# Patient Record
Sex: Female | Born: 1980
Health system: Southern US, Community
[De-identification: ages and names within clinical notes are randomized; demographics above are authoritative.]

## PROBLEM LIST (undated history)

## (undated) DIAGNOSIS — M2550 Pain in unspecified joint: Secondary | ICD-10-CM

## (undated) DIAGNOSIS — E669 Obesity, unspecified: Secondary | ICD-10-CM

## (undated) DIAGNOSIS — R519 Headache, unspecified: Secondary | ICD-10-CM

## (undated) DIAGNOSIS — Z9884 Bariatric surgery status: Secondary | ICD-10-CM

## (undated) DIAGNOSIS — E559 Vitamin D deficiency, unspecified: Secondary | ICD-10-CM

## (undated) DIAGNOSIS — M545 Low back pain, unspecified: Secondary | ICD-10-CM

## (undated) DIAGNOSIS — Z9889 Other specified postprocedural states: Secondary | ICD-10-CM

## (undated) DIAGNOSIS — R7303 Prediabetes: Secondary | ICD-10-CM

## (undated) DIAGNOSIS — R51 Headache: Secondary | ICD-10-CM

## (undated) DIAGNOSIS — M25569 Pain in unspecified knee: Secondary | ICD-10-CM

## (undated) DIAGNOSIS — R011 Cardiac murmur, unspecified: Secondary | ICD-10-CM

## (undated) DIAGNOSIS — R112 Nausea with vomiting, unspecified: Secondary | ICD-10-CM

## (undated) DIAGNOSIS — K5900 Constipation, unspecified: Secondary | ICD-10-CM

## (undated) DIAGNOSIS — Z87442 Personal history of urinary calculi: Secondary | ICD-10-CM

## (undated) DIAGNOSIS — M84376A Stress fracture, unspecified foot, initial encounter for fracture: Secondary | ICD-10-CM

## (undated) DIAGNOSIS — R600 Localized edema: Secondary | ICD-10-CM

## (undated) DIAGNOSIS — F419 Anxiety disorder, unspecified: Secondary | ICD-10-CM

## (undated) DIAGNOSIS — N83209 Unspecified ovarian cyst, unspecified side: Secondary | ICD-10-CM

## (undated) DIAGNOSIS — T8859XA Other complications of anesthesia, initial encounter: Secondary | ICD-10-CM

## (undated) DIAGNOSIS — M549 Dorsalgia, unspecified: Secondary | ICD-10-CM

## (undated) HISTORY — DX: Prediabetes: R73.03

## (undated) HISTORY — PX: NASAL SINUS SURGERY: SHX719

## (undated) HISTORY — DX: Localized edema: R60.0

## (undated) HISTORY — DX: Low back pain, unspecified: M54.50

## (undated) HISTORY — DX: Anxiety disorder, unspecified: F41.9

## (undated) HISTORY — PX: WISDOM TOOTH EXTRACTION: SHX21

## (undated) HISTORY — DX: Headache: R51

## (undated) HISTORY — DX: Pain in unspecified knee: M25.569

## (undated) HISTORY — DX: Pain in unspecified joint: M25.50

## (undated) HISTORY — PX: LAPAROSCOPIC GASTRIC BANDING: SHX1100

## (undated) HISTORY — DX: Vitamin D deficiency, unspecified: E55.9

## (undated) HISTORY — DX: Constipation, unspecified: K59.00

## (undated) HISTORY — DX: Dorsalgia, unspecified: M54.9

## (undated) HISTORY — PX: TUBAL LIGATION: SHX77

## (undated) HISTORY — DX: Stress fracture, unspecified foot, initial encounter for fracture: M84.376A

## (undated) HISTORY — DX: Low back pain: M54.5

## (undated) HISTORY — DX: Headache, unspecified: R51.9

## (undated) HISTORY — DX: Obesity, unspecified: E66.9

---

## 2016-11-17 DIAGNOSIS — Z23 Encounter for immunization: Secondary | ICD-10-CM | POA: Diagnosis not present

## 2017-03-25 ENCOUNTER — Inpatient Hospital Stay (HOSPITAL_COMMUNITY): Payer: 59 | Admitting: Certified Registered Nurse Anesthetist

## 2017-03-25 ENCOUNTER — Inpatient Hospital Stay (HOSPITAL_COMMUNITY): Payer: 59

## 2017-03-25 ENCOUNTER — Encounter (HOSPITAL_COMMUNITY): Payer: Self-pay

## 2017-03-25 ENCOUNTER — Observation Stay (HOSPITAL_COMMUNITY)
Admission: AD | Admit: 2017-03-25 | Discharge: 2017-03-26 | Disposition: A | Discharge status: Home / Self Care | Payer: 59 | Source: Ambulatory Visit | Attending: Surgery | Admitting: Surgery

## 2017-03-25 ENCOUNTER — Encounter (HOSPITAL_COMMUNITY): Admission: AD | Disposition: A | Discharge status: Home / Self Care | Payer: Self-pay | Source: Ambulatory Visit

## 2017-03-25 DIAGNOSIS — N200 Calculus of kidney: Secondary | ICD-10-CM | POA: Insufficient documentation

## 2017-03-25 DIAGNOSIS — N83209 Unspecified ovarian cyst, unspecified side: Secondary | ICD-10-CM | POA: Diagnosis not present

## 2017-03-25 DIAGNOSIS — Z9884 Bariatric surgery status: Secondary | ICD-10-CM | POA: Insufficient documentation

## 2017-03-25 DIAGNOSIS — R109 Unspecified abdominal pain: Secondary | ICD-10-CM | POA: Diagnosis not present

## 2017-03-25 DIAGNOSIS — K3580 Unspecified acute appendicitis: Secondary | ICD-10-CM | POA: Diagnosis not present

## 2017-03-25 DIAGNOSIS — Z6841 Body Mass Index (BMI) 40.0 and over, adult: Secondary | ICD-10-CM | POA: Diagnosis not present

## 2017-03-25 DIAGNOSIS — K37 Unspecified appendicitis: Secondary | ICD-10-CM | POA: Diagnosis present

## 2017-03-25 HISTORY — PX: LAPAROSCOPIC APPENDECTOMY: SHX408

## 2017-03-25 HISTORY — DX: Bariatric surgery status: Z98.84

## 2017-03-25 HISTORY — DX: Unspecified ovarian cyst, unspecified side: N83.209

## 2017-03-25 LAB — URINALYSIS, ROUTINE W REFLEX MICROSCOPIC
BACTERIA UA: NONE SEEN
BILIRUBIN URINE: NEGATIVE
Glucose, UA: NEGATIVE mg/dL
KETONES UR: NEGATIVE mg/dL
LEUKOCYTES UA: NEGATIVE
Nitrite: NEGATIVE
Protein, ur: NEGATIVE mg/dL
Specific Gravity, Urine: 1.02 (ref 1.005–1.030)
pH: 5 (ref 5.0–8.0)

## 2017-03-25 LAB — CBC
HCT: 38.5 % (ref 36.0–46.0)
Hemoglobin: 12.7 g/dL (ref 12.0–15.0)
MCH: 29.1 pg (ref 26.0–34.0)
MCHC: 33 g/dL (ref 30.0–36.0)
MCV: 88.1 fL (ref 78.0–100.0)
PLATELETS: 225 10*3/uL (ref 150–400)
RBC: 4.37 MIL/uL (ref 3.87–5.11)
RDW: 13.4 % (ref 11.5–15.5)
WBC: 7.4 10*3/uL (ref 4.0–10.5)

## 2017-03-25 LAB — COMPREHENSIVE METABOLIC PANEL
ALBUMIN: 4.2 g/dL (ref 3.5–5.0)
ALT: 16 U/L (ref 14–54)
AST: 14 U/L — AB (ref 15–41)
Alkaline Phosphatase: 60 U/L (ref 38–126)
Anion gap: 9 (ref 5–15)
BUN: 11 mg/dL (ref 6–20)
CHLORIDE: 104 mmol/L (ref 101–111)
CO2: 23 mmol/L (ref 22–32)
Calcium: 9.3 mg/dL (ref 8.9–10.3)
Creatinine, Ser: 0.72 mg/dL (ref 0.44–1.00)
GFR calc Af Amer: >60 mL/min (ref >60)
Glucose, Bld: 107 mg/dL — ABNORMAL HIGH (ref 65–99)
POTASSIUM: 3.7 mmol/L (ref 3.5–5.1)
Sodium: 136 mmol/L (ref 135–145)
Total Bilirubin: 0.8 mg/dL (ref 0.3–1.2)
Total Protein: 7.7 g/dL (ref 6.5–8.1)

## 2017-03-25 LAB — POCT PREGNANCY, URINE: PREG TEST UR: NEGATIVE

## 2017-03-25 SURGERY — APPENDECTOMY, LAPAROSCOPIC
Anesthesia: General

## 2017-03-25 MED ORDER — ACETAMINOPHEN 325 MG PO TABS: 325.0000 mg | ORAL_TABLET | ORAL | Status: DC | PRN

## 2017-03-25 MED ORDER — IOPAMIDOL (ISOVUE-300) INJECTION 61%
100.0000 mL | Freq: Once | INTRAVENOUS | Status: AC | PRN
  Administered 2017-03-25: 100 mL via INTRAVENOUS

## 2017-03-25 MED ORDER — ROCURONIUM BROMIDE 10 MG/ML (PF) SYRINGE
PREFILLED_SYRINGE | INTRAVENOUS | Status: DC | PRN
  Administered 2017-03-25: 40 mg via INTRAVENOUS

## 2017-03-25 MED ORDER — LACTATED RINGERS IV SOLN
INTRAVENOUS | Status: DC | PRN
  Administered 2017-03-25: 23:00:00 via INTRAVENOUS

## 2017-03-25 MED ORDER — BUPIVACAINE-EPINEPHRINE 0.25% -1:200000 IJ SOLN
INTRAMUSCULAR | Status: AC
  Filled 2017-03-25: qty 1

## 2017-03-25 MED ORDER — ACETAMINOPHEN 160 MG/5ML PO SOLN: 325.0000 mg | ORAL | Status: DC | PRN

## 2017-03-25 MED ORDER — DEXAMETHASONE SODIUM PHOSPHATE 4 MG/ML IJ SOLN
INTRAMUSCULAR | Status: DC | PRN
  Administered 2017-03-25: 10 mg via INTRAVENOUS

## 2017-03-25 MED ORDER — SUCCINYLCHOLINE CHLORIDE 200 MG/10ML IV SOSY
PREFILLED_SYRINGE | INTRAVENOUS | Status: DC | PRN
  Administered 2017-03-25: 120 mg via INTRAVENOUS

## 2017-03-25 MED ORDER — ONDANSETRON HCL 4 MG/2ML IJ SOLN
4.0000 mg | Freq: Once | INTRAMUSCULAR | Status: AC | PRN
  Administered 2017-03-25: 4 mg via INTRAVENOUS

## 2017-03-25 MED ORDER — SUGAMMADEX SODIUM 200 MG/2ML IV SOLN
INTRAVENOUS | Status: DC | PRN
  Administered 2017-03-25: 300 mg via INTRAVENOUS

## 2017-03-25 MED ORDER — KETOROLAC TROMETHAMINE 30 MG/ML IJ SOLN
30.0000 mg | Freq: Once | INTRAMUSCULAR | Status: DC | PRN
  Administered 2017-03-25: 30 mg via INTRAVENOUS

## 2017-03-25 MED ORDER — PIPERACILLIN-TAZOBACTAM 3.375 G IVPB 30 MIN
3.3750 g | Freq: Once | INTRAVENOUS | Status: AC
  Administered 2017-03-25: 3.375 g via INTRAVENOUS

## 2017-03-25 MED ORDER — MEPERIDINE HCL 50 MG/ML IJ SOLN: 6.2500 mg | INTRAMUSCULAR | Status: DC | PRN

## 2017-03-25 MED ORDER — FENTANYL CITRATE (PF) 100 MCG/2ML IJ SOLN
INTRAMUSCULAR | Status: DC | PRN
  Administered 2017-03-25 (×2): 100 ug via INTRAVENOUS
  Administered 2017-03-26 (×2): 50 ug via INTRAVENOUS

## 2017-03-25 MED ORDER — OXYCODONE HCL 5 MG/5ML PO SOLN
5.0000 mg | Freq: Once | ORAL | Status: DC | PRN
Start: 2017-03-25 — End: 2017-03-26
  Filled 2017-03-25: qty 5

## 2017-03-25 MED ORDER — FENTANYL CITRATE (PF) 100 MCG/2ML IJ SOLN: 25.0000 ug | INTRAMUSCULAR | Status: DC | PRN

## 2017-03-25 MED ORDER — OXYCODONE HCL 5 MG PO TABS: 5.0000 mg | ORAL_TABLET | Freq: Once | ORAL | Status: DC | PRN

## 2017-03-25 MED ORDER — PROPOFOL 10 MG/ML IV BOLUS
INTRAVENOUS | Status: DC | PRN
  Administered 2017-03-25: 200 mg via INTRAVENOUS

## 2017-03-25 MED ORDER — BUPIVACAINE-EPINEPHRINE 0.25% -1:200000 IJ SOLN
INTRAMUSCULAR | Status: DC | PRN
  Administered 2017-03-25: 50 mL

## 2017-03-25 MED ORDER — LIDOCAINE 2% (20 MG/ML) 5 ML SYRINGE
INTRAMUSCULAR | Status: DC | PRN
  Administered 2017-03-25: 80 mg via INTRAVENOUS

## 2017-03-25 MED ORDER — LACTATED RINGERS IR SOLN
Status: DC | PRN
  Administered 2017-03-25: 1000 mL

## 2017-03-25 SURGICAL SUPPLY — 32 items
APPLIER CLIP ROT 10 11.4 M/L (STAPLE)
BENZOIN TINCTURE PRP APPL 2/3 (GAUZE/BANDAGES/DRESSINGS) IMPLANT
CABLE HIGH FREQUENCY MONO STRZ (ELECTRODE) ×2 IMPLANT
CHLORAPREP W/TINT 26ML (MISCELLANEOUS) ×2 IMPLANT
CLIP APPLIE ROT 10 11.4 M/L (STAPLE) IMPLANT
COVER SURGICAL LIGHT HANDLE (MISCELLANEOUS) ×2 IMPLANT
CUTTER FLEX LINEAR 45M (STAPLE) ×2 IMPLANT
DECANTER SPIKE VIAL GLASS SM (MISCELLANEOUS) ×2 IMPLANT
DERMABOND ADVANCED (GAUZE/BANDAGES/DRESSINGS) ×1
DERMABOND ADVANCED .7 DNX12 (GAUZE/BANDAGES/DRESSINGS) ×1 IMPLANT
DRAPE LAPAROSCOPIC ABDOMINAL (DRAPES) ×2 IMPLANT
ELECT REM PT RETURN 15FT ADLT (MISCELLANEOUS) ×2 IMPLANT
ENDOLOOP SUT PDS II  0 18 (SUTURE)
ENDOLOOP SUT PDS II 0 18 (SUTURE) IMPLANT
GLOVE SURG SIGNA 7.5 PF LTX (GLOVE) ×2 IMPLANT
GOWN STRL REUS W/TWL XL LVL3 (GOWN DISPOSABLE) ×4 IMPLANT
KIT BASIN OR (CUSTOM PROCEDURE TRAY) ×2 IMPLANT
POUCH SPECIMEN RETRIEVAL 10MM (ENDOMECHANICALS) ×2 IMPLANT
RELOAD 45 VASCULAR/THIN (ENDOMECHANICALS) IMPLANT
RELOAD STAPLE TA45 3.5 REG BLU (ENDOMECHANICALS) ×2 IMPLANT
SCISSORS LAP 5X35 DISP (ENDOMECHANICALS) ×2 IMPLANT
SET IRRIG TUBING LAPAROSCOPIC (IRRIGATION / IRRIGATOR) ×2 IMPLANT
SHEARS HARMONIC ACE PLUS 36CM (ENDOMECHANICALS) ×2 IMPLANT
SLEEVE XCEL OPT CAN 5 100 (ENDOMECHANICALS) ×2 IMPLANT
STRIP CLOSURE SKIN 1/2X4 (GAUZE/BANDAGES/DRESSINGS) IMPLANT
SUT MNCRL AB 4-0 PS2 18 (SUTURE) ×2 IMPLANT
SUT VIC AB 2-0 SH 18 (SUTURE) IMPLANT
TOWEL OR 17X26 10 PK STRL BLUE (TOWEL DISPOSABLE) ×2 IMPLANT
TOWEL OR NON WOVEN STRL DISP B (DISPOSABLE) ×2 IMPLANT
TRAY LAPAROSCOPIC (CUSTOM PROCEDURE TRAY) ×2 IMPLANT
TROCAR BLADELESS OPT 5 100 (ENDOMECHANICALS) ×2 IMPLANT
TROCAR XCEL BLUNT TIP 100MML (ENDOMECHANICALS) ×2 IMPLANT

## 2017-03-25 NOTE — H&P (Signed)
Re:   Carrie HurstJessica Buck DOB:   18-Aug-1980 MRN:   [id number]  Chief Complaint Abdominal pain  ASSESEMENT AND PLAN: 1.  Appendicitis  I discussed with the patient the indications and risks of appendiceal surgery.  The primary risks of appendiceal surgery include, but are not limited to, bleeding, infection, bowel surgery, and open surgery.  There is also the risk that the patient may have continued symptoms after surgery.  We discussed the typical post-operative recovery course. I tried to answer the patient's questions.  2.  Lap band 3.  Morbid Obesity - BMI - 43 4.  Taking HCG shots for weigh loss 5.  Kidney stones on CT  Chief Complaint  Patient presents with  . Abdominal Pain   PHYSICIAN REQUESTING CONSULTATION: Patient, No Pcp Per  HISTORY OF PRESENT ILLNESS: Carrie HurstJessica Buck is a 37 y.o. (DOB: 18-Aug-1980)  white female whose primary care physician is Patient, No Pcp Per and [facility].  She does not have a PCP, because she moved to the [place] area around Nov 2018. She is by herself.   The patient was awoken at 1:30 AM this morning.  She had intense diffuse abdominal pain with nausea and vomiting.  She had a stuffed animal that she was able to warm up (warmy), she fell back asleep until about 8:30 AM, but continued to feel worse.  As the day progressed on she felt no better and [facility] around 5 PM.  She has a history of a lap band placed in [place] in 2010.  She had trouble getting the lap band serviced, so basically is been nonfunctioning for several years.  She was seeing a weight loss clinic in [place] and is getting hCG shots from them for weight loss.  She has no history of stomach, liver, colon disease.  Besides her lap band she has had 2 C-sections and a tubal ligation.  CT scan of abdomen - 03/25/2017 - 1) Acute appendicitis, 2) Enlarged spleen, 3) non obstructing kidney stones, 4) lap band in good  position    Past Medical History:  Diagnosis Date  . LAP-BAND surgery status   . Ovarian cyst       Past Surgical History:  Procedure Laterality Date  . CESAREAN SECTION    . LAPAROSCOPIC GASTRIC BANDING    . NASAL SINUS SURGERY    . TUBAL LIGATION        No current facility-administered medications for this encounter.      No Known Allergies  REVIEW OF SYSTEMS: Skin:  No history of rash.  No history of abnormal moles. Infection:  No history of hepatitis or HIV.  No history of MRSA. Neurologic:  No history of stroke.  No history of seizure.  No history of headaches. Cardiac:  No history of hypertension. No history of heart disease.  No history of seeing a cardiologist. Pulmonary:  Does not smoke cigarettes.  No asthma or bronchitis.  No OSA/CPAP.  Endocrine:  No diabetes. No thyroid disease. Gastrointestinal:  See HPI Urologic:  Kidney stones, bilateral Musculoskeletal:  No history of joint or back disease. Hematologic:  No bleeding disorder.  No history of anemia.  Not anticoagulated. Psycho-social:  The patient is oriented.   The patient has no obvious psychologic or social impairment to understanding our conversation and plan.  SOCIAL and FAMILY HISTORY: Married. Husband, Apolinar JunesBrandon [phone] is a stay at home father.  He also has a bad phone  connection at home. They have 2 daughters, 3 and 5. She works with Optometrist at [employer]  She lived in [place], [place], until Nov 2017.  She moved to [place] because a friend offered a free house.  She moved to [facility] in Nov 2018 because of a job.  Her husband is a stay at home father.  No family history of appendicitis  PHYSICAL EXAM: BP 138/84 (BP Location: Left Arm)   Pulse 83   Temp 98.4 F (36.9 C) (Oral)   Resp 16   Ht 5\' 5"  (1.651 m)   Wt 118.7 kg (261 lb 12 oz)   LMP 03/11/2017   BMI 43.56 kg/m   General: Obese WF who is alert and generally healthy appearing.  Skin:  Inspection and  palpation - no mass or rash. Eyes:  Conjunctiva and lids unremarkable.            Pupils are equal Ears, Nose, Mouth, and Throat:  Ears and nose unremarkable            Lips and teeth are unremarable. Neck: Supple. No mass, trachea midline.  No thyroid mass. Lymph Nodes:  No supraclavicular, cervical, or inguinal nodes. Lungs: Normal respiratory effort.  Clear to auscultation and symmetric breath sounds. Heart:  Palpation of the heart is normal.            Auscultation: RRR. No murmur or rub.  Abdomen: Soft. No mass.  No hernia. Lap band port in LUQ  Decreased bowel sounds.  Tender in the right abdomen with some guarding. Rectal: Not done. Musculoskeletal:  Normal gait.            Good muscle strength and ROM  in upper and lower extremities. Neurologic:  Grossly intact to motor and sensory function. Psychiatric: Normal judgement and insight. Behavior is normal.            Oriented to time, person, place.   DATA REVIEWED, COUNSELING AND COORDINATION OF CARE: Epic notes reviewed. Counseling and coordination of care exceeded more than 50% of the time spent with patient. Total time spent with patient and charting: 45 minutes  Ovidio Kin, MD,  [facility], [place] [address] [place]  [address]   [place], [place]    [address] Phone:  [phone] FAX:  [phone]

## 2017-03-25 NOTE — MAU Note (Signed)
Reports right sided lower abdominal pain, no vaginal bleeding.  hx of ovarian cysts, 2 have ruptured.

## 2017-03-25 NOTE — MAU Provider Note (Signed)
History     CSN: [id number]  Arrival date and time: 03/25/17 1709   First Provider Initiated Contact with Patient 03/25/17 1802      Chief Complaint  Patient presents with  . Abdominal Pain   HPI   Carrie Buck is a 37 y.o. female G2P2002 here in MAU with abdominal pain. States her pain started at 1:30 this am. Says she had nausea associated with the pain. States she vomited 3 X. Says she had a lot of firey hot pain in her lower abdomen; worse in the RLQ. Says she took tylenol which dulled the pain. Says the pain is constantly there and just feels annoying.  History of ovarian cysts;  With ruptured. No bleeding, no urinary complaints. Had her tubes tide after her last pregnancy.  Patient is married and recently moved to [place] from [place].   OB History    Gravida Para Term Preterm AB Living   _0 SAB TAB Ectopic Multiple Live Births                  Past Medical History:  Diagnosis Date  . LAP-BAND surgery status   . Ovarian cyst     Past Surgical History:  Procedure Laterality Date  . CESAREAN SECTION    . LAPAROSCOPIC GASTRIC BANDING    . NASAL SINUS SURGERY    . TUBAL LIGATION      History reviewed. No pertinent family history.  Social History   Tobacco Use  . Smoking status: Never Smoker  . Smokeless tobacco: Never Used  Substance Use Topics  . Alcohol use: Yes    Frequency: Never    Comment: glass of wine with dinner  . Drug use: No    Allergies: No Known Allergies  No medications prior to admission.   Results for orders placed or performed during the hospital encounter of 03/25/17 (from the past 72 hour(s))  Urinalysis, Routine w reflex microscopic     Status: Abnormal   Collection Time: 03/25/17  5:20 PM  Result Value Ref Range   Color, Urine YELLOW YELLOW   APPearance HAZY (A) CLEAR   Specific Gravity, Urine 1.020 1.005 - 1.030   pH 5.0 5.0 - 8.0   Glucose, UA NEGATIVE NEGATIVE mg/dL   Hgb urine dipstick MODERATE (A)  NEGATIVE   Bilirubin Urine NEGATIVE NEGATIVE   Ketones, ur NEGATIVE NEGATIVE mg/dL   Protein, ur NEGATIVE NEGATIVE mg/dL   Nitrite NEGATIVE NEGATIVE   Leukocytes, UA NEGATIVE NEGATIVE   RBC / HPF 6-30 0 - 5 RBC/hpf   WBC, UA 0-5 0 - 5 WBC/hpf   Bacteria, UA NONE SEEN NONE SEEN   Squamous Epithelial / LPF 6-30 (A) NONE SEEN   Mucus PRESENT     Comment: Performed at [facility], [address] [place], [place] [address]  CBC     Status: None   Collection Time: 03/25/17  5:49 PM  Result Value Ref Range   WBC 7.4 4.0 - 10.5 K/uL   RBC 4.37 3.87 - 5.11 MIL/uL   Hemoglobin 12.7 12.0 - 15.0 g/dL   HCT 38.5 36.0 - 46.0 %   MCV 88.1 78.0 - 100.0 fL   MCH 29.1 26.0 - 34.0 pg   MCHC 33.0 30.0 - 36.0 g/dL   RDW 13.4 11.5 - 15.5 %   Platelets 225 150 - 400 K/uL    Comment: Performed at [facility], [address], [place], [place] [address]  Comprehensive metabolic panel     Status: Abnormal   Collection Time: 03/25/17  5:49 PM  Result Value Ref Range   Sodium 136 135 - 145 mmol/L   Potassium 3.7 3.5 - 5.1 mmol/L   Chloride 104 101 - 111 mmol/L   CO2 23 22 - 32 mmol/L   Glucose, Bld 107 (H) 65 - 99 mg/dL   BUN 11 6 - 20 mg/dL   Creatinine, Ser 0.72 0.44 - 1.00 mg/dL   Calcium 9.3 8.9 - 10.3 mg/dL   Total Protein 7.7 6.5 - 8.1 g/dL   Albumin 4.2 3.5 - 5.0 g/dL   AST 14 (L) 15 - 41 U/L   ALT 16 14 - 54 U/L   Alkaline Phosphatase 60 38 - 126 U/L   Total Bilirubin 0.8 0.3 - 1.2 mg/dL   GFR calc non Af Amer >60 >60 mL/min   GFR calc Af Amer >60 >60 mL/min    Comment: (NOTE) The eGFR has been calculated using the CKD EPI equation. This calculation has not been validated in all clinical situations. eGFR's persistently <60 mL/min signify possible Chronic Kidney Disease.    Anion gap 9 5 - 15    Comment: Performed at [facility], [address] [place] [address], [place], [place] [address]  Pregnancy, urine POC     Status: None   Collection Time: 03/25/17  6:29 PM  Result Value Ref  Range   Preg Test, Ur NEGATIVE NEGATIVE    Comment:        THE SENSITIVITY OF THIS METHODOLOGY IS >24 mIU/mL     Review of Systems  Constitutional: Negative for fever.  Gastrointestinal: Positive for abdominal pain. Negative for nausea and vomiting.  Genitourinary: Negative for dysuria.   Physical Exam   Blood pressure 138/84, pulse 83, temperature 98.4 F (36.9 C), temperature source Oral, resp. rate 16, last menstrual period 03/11/2017.  Physical Exam  Constitutional: She is oriented to person, place, and time. She appears well-developed and well-nourished. No distress.  HENT:  Head: Normocephalic.  Eyes: Pupils are equal, round, and reactive to light.  GI: Soft. There is tenderness in the right lower quadrant and suprapubic area. There is rebound and guarding. There is no rigidity and no CVA tenderness.  Genitourinary:  Genitourinary Comments: Bimanual exam: Cervix closed, posterior, No CMT  Uterus non tender, normal size + right adnexa tenderness, no masses bilaterally, + suprapubic tenderness.  Chaperone present for exam.   Musculoskeletal: Normal range of motion.  Neurological: She is alert and oriented to person, place, and time.  Skin: Skin is warm. She is not diaphoretic.  Psychiatric: Her behavior is normal.   Results for orders placed or performed during the hospital encounter of 03/25/17 (from the past 24 hour(s))  Urinalysis, Routine w reflex microscopic     Status: Abnormal   Collection Time: 03/25/17  5:20 PM  Result Value Ref Range   Color, Urine YELLOW YELLOW   APPearance HAZY (A) CLEAR   Specific Gravity, Urine 1.020 1.005 - 1.030   pH 5.0 5.0 - 8.0   Glucose, UA NEGATIVE NEGATIVE mg/dL   Hgb urine dipstick MODERATE (A) NEGATIVE   Bilirubin Urine NEGATIVE NEGATIVE   Ketones, ur NEGATIVE NEGATIVE mg/dL   Protein, ur NEGATIVE NEGATIVE mg/dL   Nitrite NEGATIVE NEGATIVE   Leukocytes, UA NEGATIVE NEGATIVE   RBC / HPF 6-30 0 - 5 RBC/hpf   WBC, UA 0-5 0 -  5 WBC/hpf   Bacteria, UA NONE SEEN NONE SEEN   Squamous  Epithelial / LPF 6-30 (A) NONE SEEN   Mucus PRESENT   CBC     Status: None   Collection Time: 03/25/17  5:49 PM  Result Value Ref Range   WBC 7.4 4.0 - 10.5 K/uL   RBC 4.37 3.87 - 5.11 MIL/uL   Hemoglobin 12.7 12.0 - 15.0 g/dL   HCT 38.5 36.0 - 46.0 %   MCV 88.1 78.0 - 100.0 fL   MCH 29.1 26.0 - 34.0 pg   MCHC 33.0 30.0 - 36.0 g/dL   RDW 13.4 11.5 - 15.5 %   Platelets 225 150 - 400 K/uL  Comprehensive metabolic panel     Status: Abnormal   Collection Time: 03/25/17  5:49 PM  Result Value Ref Range   Sodium 136 135 - 145 mmol/L   Potassium 3.7 3.5 - 5.1 mmol/L   Chloride 104 101 - 111 mmol/L   CO2 23 22 - 32 mmol/L   Glucose, Bld 107 (H) 65 - 99 mg/dL   BUN 11 6 - 20 mg/dL   Creatinine, Ser 0.72 0.44 - 1.00 mg/dL   Calcium 9.3 8.9 - 10.3 mg/dL   Total Protein 7.7 6.5 - 8.1 g/dL   Albumin 4.2 3.5 - 5.0 g/dL   AST 14 (L) 15 - 41 U/L   ALT 16 14 - 54 U/L   Alkaline Phosphatase 60 38 - 126 U/L   Total Bilirubin 0.8 0.3 - 1.2 mg/dL   GFR calc non Af Amer >60 >60 mL/min   GFR calc Af Amer >60 >60 mL/min   Anion gap 9 5 - 15  Pregnancy, urine POC     Status: None   Collection Time: 03/25/17  6:29 PM  Result Value Ref Range   Preg Test, Ur NEGATIVE NEGATIVE   Ct Abdomen Pelvis W Contrast  Result Date: 03/25/2017 CLINICAL DATA:  Right-sided abdominal pain history of ovarian cysts EXAM: CT ABDOMEN AND PELVIS WITH CONTRAST TECHNIQUE: Multidetector CT imaging of the abdomen and pelvis was performed using the standard protocol following bolus administration of intravenous contrast. CONTRAST:  167m ISOVUE-300 IOPAMIDOL (ISOVUE-300) INJECTION 61% COMPARISON:  None. FINDINGS: Lower chest: Lung bases demonstrate no acute consolidation or effusion. Heart size upper normal. Hepatobiliary: No focal hepatic abnormality. No calcified gallstones or biliary dilatation Pancreas: Unremarkable. No pancreatic ductal dilatation or surrounding  inflammatory changes. Spleen: Enlarged, measuring 16 cm AP.  No focal abnormality Adrenals/Urinary Tract: Adrenal glands are within normal limits. No hydronephrosis. Punctate stone in the midpole of the left kidney. Punctate stones in the mid and lower pole of the right kidney. Bladder unremarkable Stomach/Bowel: Status post gastric banding. No dilated small bowel. No colon wall thickening. Enlarged appendix, measuring up to 12 mm in diameter with mild surrounding soft tissue stranding. No extraluminal gas. Vascular/Lymphatic: No significant vascular findings are present. No enlarged abdominal or pelvic lymph nodes. Reproductive: Uterus and bilateral adnexa are unremarkable. Other: Negative for free air or free fluid. Tiny calcifications or surgical changes deep to the umbilicus. Musculoskeletal: No acute or significant osseous findings. IMPRESSION: 1. Findings consistent with acute appendicitis Appendix: Location: Right lower quadrant Diameter: 12 mm Appendicolith: None Mucosal hyper-enhancement: Not seen Extraluminal gas: Not seen Periappendiceal collection: Not seen 2.   Enlarged spleen 3.  Nonobstructing kidney stones Critical Value/emergent results were called by telephone at the time of interpretation on 03/25/2017 at 9:33 pm to Dr. MJulianne Handler who verbally acknowledged these results. Electronically Signed   By: KDonavan FoilM.D.   On: 03/25/2017 21:33  MAU Course  Procedures  None  MDM  Patient without leukocytosis however pain is acute, with sudden onset. N/V last night with rebound and guarding in the RLQ. CT scan ordered Patient declines pain medication at this time. Patient awaiting CT scan. Report given to Julianne Handler who assumes care of the patient.  Julianne Handler, CNM   CT consistent with acute appendicitis. Consult with general surgeon Dr. Lucia Gaskins, accepts transfer to [place].  Assessment and Plan   1. Acute appendicitis, unspecified acute appendicitis type    Transfer to  [facility] per Gen Surgery  Julianne Handler, [place]  03/25/2017 9:54 PM

## 2017-03-25 NOTE — Anesthesia Preprocedure Evaluation (Signed)
Anesthesia Evaluation  Patient identified by MRN, date of birth, ID band Patient awake    Reviewed: Allergy & Precautions, NPO status , Patient's Chart, lab work & pertinent test results  Airway Mallampati: II  TM Distance: >3 FB Neck ROM: Full    Dental no notable dental hx.    Pulmonary neg pulmonary ROS,    Pulmonary exam normal breath sounds clear to auscultation       Cardiovascular negative cardio ROS Normal cardiovascular exam Rhythm:Regular Rate:Normal     Neuro/Psych negative neurological ROS  negative psych ROS   GI/Hepatic negative GI ROS, Neg liver ROS,   Endo/Other  negative endocrine ROS  Renal/GU negative Renal ROS  negative genitourinary   Musculoskeletal negative musculoskeletal ROS (+)   Abdominal   Peds negative pediatric ROS (+)  Hematology negative hematology ROS (+)   Anesthesia Other Findings   Reproductive/Obstetrics negative OB ROS                             Anesthesia Physical Anesthesia Plan  ASA: II and emergent  Anesthesia Plan: General   Post-op Pain Management:    Induction: Intravenous and Cricoid pressure planned  PONV Risk Score and Plan: 3 and Ondansetron, Dexamethasone and Treatment may vary due to age or medical condition  Airway Management Planned: Oral ETT  Additional Equipment:   Intra-op Plan:   Post-operative Plan: Extubation in OR  Informed Consent: I have reviewed the patients History and Physical, chart, labs and discussed the procedure including the risks, benefits and alternatives for the proposed anesthesia with the patient or authorized representative who has indicated his/her understanding and acceptance.     Plan Discussed with: CRNA, Anesthesiologist and Surgeon  Anesthesia Plan Comments: (  )        Anesthesia Quick Evaluation

## 2017-03-25 NOTE — Progress Notes (Addendum)
1910: Pt drank of of CT contrast  1925: Cmet collected  2026: Ambulate to bathroom, voided  2030: Off to CT  2048: CT completed, pt returned to room  2052: Pt reported rt flank pain is now 5/10 which "feels like a warm flask" However she doesn't require any pain medication at this time.   2214: Report called to Jomarie Longson Cockrin CRNA @ [facility]. Pt is to be taken to the PACU area.  Nurse Clydie BraunKaren is to be called as soon as EMS arrive.  Dr Ezzard StandingNewman at bedside.  2235: [facility].. Nurse Clydie BraunKaren was informed And Dr Ezzard StandingNewman spoke to patients husband.

## 2017-03-25 NOTE — Anesthesia Procedure Notes (Signed)
Procedure Name: Intubation Date/Time: 03/25/2017 11:18 PM Performed by: Vanessa Durhamochran, Kiya Eno Glenn, CRNA Pre-anesthesia Checklist: Patient identified, Emergency Drugs available, Suction available and Patient being monitored Patient Re-evaluated:Patient Re-evaluated prior to induction Oxygen Delivery Method: Circle system utilized Preoxygenation: Pre-oxygenation with 100% oxygen Induction Type: IV induction, Cricoid Pressure applied and Rapid sequence Laryngoscope Size: 2 and Miller Grade View: Grade I Tube type: Oral Tube size: 7.0 mm Number of attempts: 1 Airway Equipment and Method: Stylet Placement Confirmation: ETT inserted through vocal cords under direct vision,  positive ETCO2 and breath sounds checked- equal and bilateral Secured at: 23 cm Tube secured with: Tape Dental Injury: Teeth and Oropharynx as per pre-operative assessment

## 2017-03-26 ENCOUNTER — Encounter (HOSPITAL_COMMUNITY): Payer: Self-pay | Admitting: Surgery

## 2017-03-26 ENCOUNTER — Other Ambulatory Visit: Payer: Self-pay

## 2017-03-26 DIAGNOSIS — K37 Unspecified appendicitis: Secondary | ICD-10-CM | POA: Diagnosis present

## 2017-03-26 DIAGNOSIS — K3580 Unspecified acute appendicitis: Secondary | ICD-10-CM

## 2017-03-26 MED ORDER — ENOXAPARIN SODIUM 40 MG/0.4ML ~~LOC~~ SOLN: 40.0000 mg | Freq: Every day | SUBCUTANEOUS | Status: DC

## 2017-03-26 MED ORDER — ONDANSETRON 4 MG PO TBDP: 4.0000 mg | ORAL_TABLET | Freq: Four times a day (QID) | ORAL | Status: DC | PRN

## 2017-03-26 MED ORDER — KCL IN DEXTROSE-NACL 20-5-0.45 MEQ/L-%-% IV SOLN
INTRAVENOUS | Status: DC
  Administered 2017-03-26: 02:00:00 via INTRAVENOUS
  Filled 2017-03-26 (×3): qty 1000

## 2017-03-26 MED ORDER — HYDROCODONE-ACETAMINOPHEN 5-325 MG PO TABS
1.0000 | ORAL_TABLET | ORAL | Status: DC | PRN
  Administered 2017-03-26: 1 via ORAL
  Filled 2017-03-26: qty 1

## 2017-03-26 MED ORDER — CELECOXIB 200 MG PO CAPS
200.0000 mg | ORAL_CAPSULE | Freq: Two times a day (BID) | ORAL | Status: DC
  Administered 2017-03-26: 200 mg via ORAL
  Filled 2017-03-26: qty 1

## 2017-03-26 MED ORDER — HYDROMORPHONE HCL 1 MG/ML IJ SOLN
0.2500 mg | INTRAMUSCULAR | Status: DC | PRN
  Administered 2017-03-26 (×2): 0.25 mg via INTRAVENOUS

## 2017-03-26 MED ORDER — IBUPROFEN 200 MG PO TABS: ORAL_TABLET | ORAL | Status: DC

## 2017-03-26 MED ORDER — HYDROCODONE-ACETAMINOPHEN 5-325 MG PO TABS: 1.0000 | ORAL_TABLET | ORAL | 0 refills | Status: DC | PRN

## 2017-03-26 MED ORDER — HYDROMORPHONE HCL 1 MG/ML IJ SOLN
INTRAMUSCULAR | Status: AC
  Filled 2017-03-26: qty 1

## 2017-03-26 MED ORDER — ONDANSETRON HCL 4 MG/2ML IJ SOLN: 4.0000 mg | Freq: Four times a day (QID) | INTRAMUSCULAR | Status: DC | PRN

## 2017-03-26 MED ORDER — MORPHINE SULFATE (PF) 2 MG/ML IV SOLN
1.0000 mg | INTRAVENOUS | Status: DC | PRN
  Administered 2017-03-26: 2 mg via INTRAVENOUS
  Filled 2017-03-26: qty 1

## 2017-03-26 MED ORDER — GABAPENTIN 300 MG PO CAPS
300.0000 mg | ORAL_CAPSULE | Freq: Two times a day (BID) | ORAL | Status: DC
  Administered 2017-03-26 (×2): 300 mg via ORAL
  Filled 2017-03-26 (×2): qty 1

## 2017-03-26 MED ORDER — PIPERACILLIN-TAZOBACTAM 3.375 G IVPB
3.3750 g | Freq: Three times a day (TID) | INTRAVENOUS | Status: DC
  Administered 2017-03-26: 3.375 g via INTRAVENOUS
  Filled 2017-03-26 (×2): qty 50

## 2017-03-26 MED ORDER — TRAMADOL HCL 50 MG PO TABS: 50.0000 mg | ORAL_TABLET | Freq: Four times a day (QID) | ORAL | Status: DC | PRN

## 2017-03-26 MED ORDER — ACETAMINOPHEN 325 MG PO TABS: ORAL_TABLET | ORAL | Status: DC

## 2017-03-26 NOTE — Transfer of Care (Signed)
Immediate Anesthesia Transfer of Care Note  Patient: Carrie HurstJessica Buck  Procedure(s) Performed: APPENDECTOMY LAPAROSCOPIC (N/A )  Patient Location: PACU  Anesthesia Type:General  Level of Consciousness: drowsy  Airway & Oxygen Therapy: Patient Spontanous Breathing and Patient connected to face mask  Post-op Assessment: Report given to RN and Post -op Vital signs reviewed and stable  Post vital signs: Reviewed and stable  Last Vitals:  Vitals:   03/25/17 1736 03/25/17 2150  BP: 126/84 138/84  Pulse: 82 83  Resp: 18 16  Temp: 36.6 C 36.9 C    Last Pain:  Vitals:   03/25/17 2152  TempSrc:   PainSc: 5       Patients Stated Pain Goal: 0 (03/25/17 2152)  Complications: No apparent anesthesia complications

## 2017-03-26 NOTE — Progress Notes (Signed)
1 Day Post-Op lap appy Subjective: Feels some band-like pain in upper abd, no nausea  Objective: Vital signs in last 24 hours: Temp:  [97.9 F (36.6 C)-98.8 F (37.1 C)] 98.2 F (36.8 C) (02/18 0513) Pulse Rate:  [62-83] 76 (02/18 0513) Resp:  [12-18] 13 (02/18 0513) BP: (124-146)/(67-87) 131/79 (02/18 0513) SpO2:  [100 %] 100 % (02/18 0513) Weight:  [118.7 kg (261 lb 12 oz)] 118.7 kg (261 lb 12 oz) (02/17 2150)   Intake/Output from previous day: 02/17 0701 - 02/18 0700 In: 1420 [P.O.:120; I.V.:1300] Out: 1205 [Urine:1200; Blood:5] Intake/Output this shift: No intake/output data recorded.   General appearance: alert and cooperative GI: normal findings: soft, non-distended  Incision: no significant drainage  Lab Results:  Recent Labs    03/25/17 1749  WBC 7.4  HGB 12.7  HCT 38.5  PLT 225   BMET Recent Labs    03/25/17 1749  NA 136  K 3.7  CL 104  CO2 23  GLUCOSE 107*  BUN 11  CREATININE 0.72  CALCIUM 9.3   PT/INR No results for input(s): LABPROT, INR in the last 72 hours. ABG No results for input(s): PHART, HCO3 in the last 72 hours.  Invalid input(s): PCO2, PO2  MEDS, Scheduled . celecoxib  200 mg Oral BID  . enoxaparin (LOVENOX) injection  40 mg Subcutaneous Daily  . gabapentin  300 mg Oral BID  . HYDROmorphone        Studies/Results: Ct Abdomen Pelvis W Contrast  Result Date: 03/25/2017 CLINICAL DATA:  Right-sided abdominal pain history of ovarian cysts EXAM: CT ABDOMEN AND PELVIS WITH CONTRAST TECHNIQUE: Multidetector CT imaging of the abdomen and pelvis was performed using the standard protocol following bolus administration of intravenous contrast. CONTRAST:  ISOVUE-300 IOPAMIDOL (ISOVUE-300) INJECTION 61% COMPARISON:  None. FINDINGS: Lower chest: Lung bases demonstrate no acute consolidation or effusion. Heart size upper normal. Hepatobiliary: No focal hepatic abnormality. No calcified gallstones or biliary dilatation Pancreas:  Unremarkable. No pancreatic ductal dilatation or surrounding inflammatory changes. Spleen: Enlarged, measuring 16 cm AP.  No focal abnormality Adrenals/Urinary Tract: Adrenal glands are within normal limits. No hydronephrosis. Punctate stone in the midpole of the left kidney. Punctate stones in the mid and lower pole of the right kidney. Bladder unremarkable Stomach/Bowel: Status post gastric banding. No dilated small bowel. No colon wall thickening. Enlarged appendix, measuring up to 12 mm in diameter with mild surrounding soft tissue stranding. No extraluminal gas. Vascular/Lymphatic: No significant vascular findings are present. No enlarged abdominal or pelvic lymph nodes. Reproductive: Uterus and bilateral adnexa are unremarkable. Other: Negative for free air or free fluid. Tiny calcifications or surgical changes deep to the umbilicus. Musculoskeletal: No acute or significant osseous findings. IMPRESSION: 1. Findings consistent with acute appendicitis Appendix: Location: Right lower quadrant Diameter: 12 mm Appendicolith: None Mucosal hyper-enhancement: Not seen Extraluminal gas: Not seen Periappendiceal collection: Not seen 2.   Enlarged spleen 3.  Nonobstructing kidney stones Critical Value/emergent results were called by telephone at the time of interpretation on 03/25/2017 at 9:33 pm to Dr. Donette Larry, who verbally acknowledged these results. Electronically Signed   By: Jasmine Pang M.D.   On: 03/25/2017 21:33    Assessment: s/p Procedure(s): APPENDECTOMY LAPAROSCOPIC Patient Active Problem List   Diagnosis Date Noted  . Appendicitis 03/26/2017    Expected post op course  Plan: Advance diet  Decrease MIV Ambulate PO pain meds D/c later today vs tom   LOS: 0 days     .Vanita Panda, MD  [facility], [place] [phone]   03/26/2017 7:59 AM

## 2017-03-26 NOTE — Discharge Instructions (Signed)
Laparoscopic Appendectomy, Adult, Care After °These instructions give you information about caring for yourself after your procedure. Your doctor may also give you more specific instructions. Call your doctor if you have any problems or questions after your procedure. °Follow these instructions at home: °Medicines °· Take over-the-counter and prescription medicines only as told by your doctor. °· Do not drive for 24 hours if you received a sedative. °· Do not drive or use heavy machinery while taking prescription pain medicine. °· If you were prescribed an antibiotic medicine, take it as told by your doctor. Do not stop taking it even if you start to feel better. °Activity °· Do not lift anything that is heavier than 10 pounds (4.5 kg) for 3 weeks or as told by your doctor. °· Do not play contact sports for 3 weeks or as told by your doctor. °· Slowly return to your normal activities. °Bathing °· Keep your cuts from surgery (incisions) clean and dry. °? Gently wash the cuts with soap and water. °? Rinse the cuts with water until the soap is gone. °? Pat the cuts dry with a clean towel. Do not rub the cuts. °· You may take showers after 48 hours. °· Do not take baths, swim, or use a hot tub for 2 weeks or as told by your doctor. °Cut Care °· Follow instructions from your doctor about how to take care of your cuts. Make sure you: °? Wash your hands with soap and water before you change your bandage (dressing). If you do not have soap and water, use hand sanitizer. °? Change your bandage as told by your doctor. °? Leave stitches (sutures), skin glue, or skin tape (adhesive) strips in place. They may need to stay in place for 2 weeks or longer. If tape strips get loose and curl up, you may trim the loose edges. Do not remove tape strips completely unless your doctor says it is okay. °· Check your cuts every day for signs of infection. Check for: °? More redness, swelling, or pain. °? More fluid or  blood. °? Warmth. °? Pus or a bad smell. °Other Instructions °· If you were sent home with a drain, follow instructions from your doctor about how to use it and care for it. °· Take deep breaths. This helps to keep your lungs from getting swollen (inflamed). °· To help with constipation: °? Drink plenty of fluids. °? Eat plenty of fruits and vegetables. °· Keep all follow-up visits as told by your doctor. This is important. °Contact a doctor if: °· You have more redness, swelling, or pain around a cut from surgery. °· You have more fluid or blood coming from a cut. °· Your cut feels warm to the touch. °· You have pus or a bad smell coming from a cut or a bandage. °· The edges of a cut break open after the stitches have been taken out. °· You have pain in your shoulders that gets worse. °· You feel dizzy or you pass out (faint). °· You have shortness of breath. °· You keep feeling sick to your stomach (nauseous). °· You keep throwing up (vomiting). °· You get diarrhea or you cannot control your poop. °· You lose your appetite. °· You have swelling or pain in your legs. °Get help right away if: °· You have a fever. °· You get a rash. °· You have trouble breathing. °· You have sharp pains in your chest. °This information is not intended to replace advice given   to you by your health care provider. Make sure you discuss any questions you have with your health care provider. °Document Released: 11/19/2008 Document Revised: 07/01/2015 Document Reviewed: 07/13/2014 °Elsevier Interactive Patient Education © 2018 [employer] ° °CCS ______[facility] °LAPAROSCOPIC SURGERY: POST OP INSTRUCTIONS °Always review your discharge instruction sheet given to you by the facility where your surgery was performed. °IF YOU HAVE DISABILITY OR FAMILY LEAVE FORMS, YOU MUST BRING THEM TO THE OFFICE FOR PROCESSING.   °DO NOT GIVE THEM TO YOUR DOCTOR. ° °1. A prescription for pain medication may be given to you upon  discharge.  Take your pain medication as prescribed, if needed.  If narcotic pain medicine is not needed, then you may take acetaminophen (Tylenol) or ibuprofen (Advil) as needed. °2. Take your usually prescribed medications unless otherwise directed. °3. If you need a refill on your pain medication, please contact your pharmacy.  They will contact our office to request authorization. Prescriptions will not be filled after 5pm or on week-ends. °4. You should follow a light diet the first few days after arrival home, such as soup and crackers, etc.  Be sure to include lots of fluids daily. °5. Most patients will experience some swelling and bruising in the area of the incisions.  Ice packs will help.  Swelling and bruising can take several days to resolve.  °6. It is common to experience some constipation if taking pain medication after surgery.  Increasing fluid intake and taking a stool softener (such as Colace) will usually help or prevent this problem from occurring.  A mild laxative (Milk of Magnesia or Miralax) should be taken according to package instructions if there are no bowel movements after 48 hours. °7. Unless discharge instructions indicate otherwise, you may remove your bandages 24-48 hours after surgery, and you may shower at that time.  You may have steri-strips (small skin tapes) in place directly over the incision.  These strips should be left on the skin for 7-10 days.  If your surgeon used skin glue on the incision, you may shower in 24 hours.  The glue will flake off over the next 2-3 weeks.  Any sutures or staples will be removed at the office during your follow-up visit. °8. ACTIVITIES:  You may resume regular (light) daily activities beginning the next day--such as daily self-care, walking, climbing stairs--gradually increasing activities as tolerated.  You may have sexual intercourse when it is comfortable.  Refrain from any heavy lifting or straining until approved by your doctor. °a. You  may drive when you are no longer taking prescription pain medication, you can comfortably wear a seatbelt, and you can safely maneuver your car and apply brakes. °b. RETURN TO WORK:  __________________________________________________________ °9. You should see your doctor in the office for a follow-up appointment approximately 2-3 weeks after your surgery.  Make sure that you call for this appointment within a day or two after you arrive home to insure a convenient appointment time. °10. OTHER INSTRUCTIONS: __________________________________________________________________________________________________________________________ __________________________________________________________________________________________________________________________ °WHEN TO CALL YOUR DOCTOR: °1. Fever over 101.0 °2. Inability to urinate °3. Continued bleeding from incision. °4. Increased pain, redness, or drainage from the incision. °5. Increasing abdominal pain ° °The clinic staff is available to answer your questions during regular business hours.  Please don’t hesitate to call and ask to speak to one of the nurses for clinical concerns.  If you have a medical emergency, go to the nearest emergency room or call 911.  A surgeon from   [facility] is always on call at the hospital. °[address], [address], [place], [place]  [address] ? [address] [address], [place], [place]   [address] °[phone] ? [phone] ? FAX [phone] °Web site: [email or web address] ° °

## 2017-03-26 NOTE — Progress Notes (Signed)
RN reviewed discharge instructions with patient and family. All questions answered.   Paperwork, work note, and prescriptions given.   RN took patient down to family car with all belongings.

## 2017-03-26 NOTE — Op Note (Signed)
Re:   Carrie HurstJessica Buck DOB:   01-07-1981 MRN:   [id number]                   [facility]  DATE OF PROCEDURE: 03/26/2017                              OPERATIVE REPORT  PREOPERATIVE DIAGNOSIS:  Appendicitis  POSTOPERATIVE DIAGNOSIS:  Acute appendicitis.  PROCEDURE:  Laparoscopic appendectomy.  SURGEON:  Sandria Balesavid H. Ezzard StandingNewman, MD  ASSISTANT:  No first assistant.  ANESTHESIA:  General endotracheal.  Anesthesiologist: Bethena Midgetddono, Ernest, MD CRNA: Vanessa Durhamochran, Ronald Glenn, CRNA  ASA:  2E  ESTIMATED BLOOD LOSS:  Minimal.  DRAINS: none   SPECIMEN:   Appendix  COUNTS CORRECT:  YES  INDICATIONS FOR PROCEDURE: Carrie Buck is a 37 y.o. (DOB: 01-07-1981) white female whose primary care doctor is Patient, No Pcp Per and comes to the OR for an appendectomy.   I discussed with the patient, the indications and potential complications of appendiceal surgery.  The potential complications include, but are not limited to, bleeding, open surgery, bowel resection, and the possibility of another diagnosis.  OPERATIVE NOTE:  The patient underwent a general endotracheal anesthetic as supervised by Anesthesiologist: Bethena Midgetddono, Ernest, MD CRNA: Vanessa Durhamochran, Ronald Glenn, CRNA, General, in OR room #1.  The patient was given Zosyn prior to the beginning of the procedure and the abdomen was prepped with ChloraPrep.   A time-out was held and surgical checklist run.  An infraumbilical incision was made with sharp dissection carried down to the abdominal cavity.  An 12 mm Hasson trocar was inserted through the infraumbilical incision and into the peritoneal cavity.  A 30 degree 5 mm laparoscope was inserted through a 12 mm Hasson trocar and the Hasson trocar secured with a 0 Vicryl suture.  I placed a 5 mm trocar in the right upper quadrant and a 5 mm torcar in left lower quadrant and did abdominal exploration.    The right and left lobes of liver unremarkable.  Stomach was unremarkable.  I could see the tubing for the lap  band, which looked normal.  I did not try to evaluate the lap band around the stomach. The pelvic organs were unremarkable, though her right ovary did look like it had a recent ruptured follicle.  I saw no other intra-abdominal abnormality.  The patient had appendicitis with the appendix located in the right colonic gutter.  Her cecum was somewhat high on the right side.  She had a long and very redundant sigmoid colon.  The appendix was turgid, but not ruptured.  The mesentery of the appendix was divided with a Harmonic scalpel.  I got to the base of the appendix.  I then used a blue load 45 mm Ethicon Endo-GIA stapler and fired this across the base of the appendix.  I placed the appendix in EndoCatch bag and delivered the bag through the umbilical incision.  I irrigated the abdomen with 500 cc of saline.  After irrigating the abdomen, I then removed the trocars, in turn.  The umbilical port fascia was closed with 0 Vicryl suture.   I closed the skin each site with a 4-0 Monocryl suture and painted the wounds with DermaBond.  I then injected a total of 30 mL of 0.25% Marcaine at the incisions.  Sponge and needle count were correct at the end of the case.  The patient was transferred to the recovery  room in good condition.  The patient tolerated the procedure well and it depends on the patient's post op clinical course as to when the patient could be discharged.   Carrie Kin, MD, [facility] Pager: [phone] Office phone:  [phone]

## 2017-03-26 NOTE — OR Nursing (Signed)
Time- 0050 - [facility] report [facility] RN. Patient vital signs are stable, NAD at this time. Valuables key given to Ruben Reasonaiya, RN to retrieve patient belongings from security.

## 2017-03-26 NOTE — Progress Notes (Signed)
Feels much better tolerating diet, full liquids, pain still there but better.  Sites all look good, + BM, and she would like to go home.   Will discharge home today.  I have personally reviewed the patients medication history on the [place] controlled substance database.

## 2017-03-27 NOTE — Anesthesia Postprocedure Evaluation (Signed)
Anesthesia Post Note  Patient: Lanny HurstJessica Friske  Procedure(s) Performed: APPENDECTOMY LAPAROSCOPIC (N/A )     Patient location during evaluation: PACU Anesthesia Type: General Level of consciousness: awake and alert Pain management: pain level controlled Vital Signs Assessment: post-procedure vital signs reviewed and stable Respiratory status: spontaneous breathing, nonlabored ventilation, respiratory function stable and patient connected to nasal cannula oxygen Cardiovascular status: blood pressure returned to baseline and stable Postop Assessment: no apparent nausea or vomiting Anesthetic complications: no    Last Vitals:  Vitals:   03/26/17 0513 03/26/17 1323  BP: 131/79 126/74  Pulse: 76 63  Resp: 13 15  Temp: 36.8 C (!) 36.4 C  SpO2: 100% 100%    Last Pain:  Vitals:   03/26/17 1323  TempSrc: Oral  PainSc: 0-No pain                 Leland Raver

## 2017-03-29 NOTE — Discharge Summary (Signed)
Physician Discharge Summary  Patient ID: Carrie Buck MRN: [id number] DOB/AGE: 37-Nov-1982 37 y.o.  Admit date: 03/25/2017 Discharge date: 03/27/2017  Admission Diagnoses:  Acute appendicitis Hx of Lap Band BMI 43 Nephrolithiasis on CT HCG shots for weight loss  Discharge Diagnoses:  Same Active Problems:   Appendicitis   PROCEDURES: Laparoscopic appendectomy 03/26/17, Dr. Alfredia Ferguson Course:  Carrie Buck is a 37 y.o. (DOB: 1980-09-26)  white female whose primary care physician is Patient, No Pcp Per and comes to [facility] Admission today for abdominal pain.  She does not have a PCP, because she moved to the [place] area around Nov 2018. She is by herself. The patient was awoken at 1:30 AM this morning.  She had intense diffuse abdominal pain with nausea and vomiting.  She had a stuffed animal that she was able to warm up (warmy), she fell back asleep until about 8:30 AM, but continued to feel worse.  As the day progressed on she felt no better and came to [facility] around 5 PM.             She has a history of a lap band placed in [place] in 2010.  She had trouble getting the lap band serviced, so basically is been nonfunctioning for several years.  She was seeing a weight loss clinic in [place] and is getting hCG shots from them for weight loss.  She has no history of stomach, liver, colon disease.  Besides her lap band she has had 2 C-sections and a tubal ligation. CT scan of abdomen - 03/25/2017 - 1) Acute appendicitis, 2) Enlarged spleen, 3) non obstructing kidney stones, 4) lap band in good position  Pt was seen in the ED by Dr. Ezzard Standing and admitted the PM of 2/17.  She was taken to the OR the following AM.  She tolerated the procedure well.  She was seen the following AM by Dr. Maisie Fus, and was still having some discomfort.  She was mobilized and her diet was advanced.  She was ready for discharge later that afternoon.  Follow up was arranged and she  was discharged to home.    Condition on discharge:  Improved   CBC Latest Ref Rng & Units 03/25/2017  WBC 4.0 - 10.5 K/uL 7.4  Hemoglobin 12.0 - 15.0 g/dL 78.2  Hematocrit 95.6 - 46.0 % 38.5  Platelets 150 - 400 K/uL 225   CMP Latest Ref Rng & Units 03/25/2017  Glucose 65 - 99 mg/dL 213(Y)  BUN 6 - 20 mg/dL 11  Creatinine 8.65 - 7.84 mg/dL 6.96  Sodium 295 - 284 mmol/L 136  Potassium 3.5 - 5.1 mmol/L 3.7  Chloride 101 - 111 mmol/L 104  CO2 22 - 32 mmol/L 23  Calcium 8.9 - 10.3 mg/dL 9.3  Total Protein 6.5 - 8.1 g/dL 7.7  Total Bilirubin 0.3 - 1.2 mg/dL 0.8  Alkaline Phos 38 - 126 U/L 60  AST 15 - 41 U/L 14(L)  ALT 14 - 54 U/L 16   Disposition: 01-Home or Self Care   Allergies as of 03/26/2017      Reactions   Penicillins Rash   Has patient had a PCN reaction causing immediate rash, facial/tongue/throat swelling, SOB or lightheadedness with hypotension: Yes Has patient had a PCN reaction causing severe rash involving mucus membranes or skin necrosis: No Has patient had a PCN reaction that required hospitalization: No Has patient had a PCN reaction occurring within the last 10 years: No If all  of the above answers are "NO", then may proceed with Cephalosporin use.      Medication List    TAKE these medications   acetaminophen 325 MG tablet Commonly known as:  TYLENOL You can take 2 tablets every 4 hours as needed for pain.  Alternate with Ibuprofen or the Tyelno/Hydrocodone pain medicine prescribed.    DO NOT TAKE MORE THAN 4000 MG OF TYLENOL PER DAY.  IT CAN HARM YOUR LIVER.  TYLENOL (ACETAMINOPHEN) IS ALSO IN YOUR PRESCRIPTION PAIN MEDICATION.  YOU HAVE TO COUNT IT IN YOUR DAILY TOTAL.   HYDROcodone-acetaminophen 5-325 MG tablet Commonly known as:  NORCO/VICODIN Take 1-2 tablets by mouth every 4 (four) hours as needed for moderate pain.   ibuprofen 200 MG tablet Commonly known as:  ADVIL,MOTRIN You can take 2-3 tablets every 6 hours as needed for pain.  You can  alternate with plain tylenol or the Tylenol and hydrocodone combination prescribed.  You can buy this over the counter at any drug store.   NON FORMULARY Inject 20 mLs as directed daily.      Follow-up Information    [facility] 04/10/2017.   Specialty:  General Surgery Why:  Your appointment is at 8:45AM.  Be at the office 30 minutes early for check in.  Bring photo ID and insurance information.   Contact information: [address] [place] [address] [phone]           Signed: Sherrie GeorgeJENNINGS,Kyaira Trantham 03/29/2017, 12:01 PM

## 2017-05-09 ENCOUNTER — Encounter: Payer: 59 | Admitting: Obstetrics and Gynecology

## 2017-05-09 ENCOUNTER — Ambulatory Visit (INDEPENDENT_AMBULATORY_CARE_PROVIDER_SITE_OTHER): Payer: 59 | Admitting: Obstetrics & Gynecology

## 2017-05-09 ENCOUNTER — Encounter: Payer: Self-pay | Admitting: Obstetrics & Gynecology

## 2017-05-09 VITALS — BP 128/82 | HR 86 | Ht 65.0 in | Wt 267.0 lb

## 2017-05-09 DIAGNOSIS — N920 Excessive and frequent menstruation with regular cycle: Secondary | ICD-10-CM | POA: Diagnosis not present

## 2017-05-09 MED ORDER — NORGESTREL-ETHINYL ESTRADIOL 0.3-30 MG-MCG PO TABS: 1.0000 | ORAL_TABLET | Freq: Every day | ORAL | 11 refills | Status: DC

## 2017-05-09 NOTE — Progress Notes (Signed)
Patient ID: Carrie HurstJessica Buck, female   DOB: March 20, 1980, 37 y.o.   MRN: [id number]  Chief Complaint  Patient presents with  . Gynecologic Exam    HPI Carrie Buck is a 37 y.o. female.married P2 (5 and 37 yo kids) here for a new patient visit to discuss heavy periods. Menarche at [age], always regular. She was started on OCPs as a teen for painful periods. They were light and non- painful and on time. Her periods are now q 4 weeks, last about 3 days, but very heavy. She will go through 4-6 overnights pads per day. She had a BTL. She wants to restart OCPs for menstrual regulation.  HPI  Past Medical History:  Diagnosis Date  . LAP-BAND surgery status   . Ovarian cyst     Past Surgical History:  Procedure Laterality Date  . CESAREAN SECTION    . LAPAROSCOPIC APPENDECTOMY N/A 03/25/2017   Procedure: APPENDECTOMY LAPAROSCOPIC;  Surgeon: Ovidio KinNewman, David, MD;  Location: [facility];  Service: General;  Laterality: N/A;  . LAPAROSCOPIC GASTRIC BANDING    . NASAL SINUS SURGERY    . TUBAL LIGATION      No family history on file.  Social History Social History   Tobacco Use  . Smoking status: Never Smoker  . Smokeless tobacco: Never Used  Substance Use Topics  . Alcohol use: Yes    Frequency: Never    Comment: glass of wine with dinner  . Drug use: No    Allergies  Allergen Reactions  . Penicillins Rash    Has patient had a PCN reaction causing immediate rash, facial/tongue/throat swelling, SOB or lightheadedness with hypotension: Yes Has patient had a PCN reaction causing severe rash involving mucus membranes or skin necrosis: No Has patient had a PCN reaction that required hospitalization: No Has patient had a PCN reaction occurring within the last 10 years: No If all of the above answers are "NO", then may proceed with Cephalosporin use.     No current outpatient medications on file.   No current facility-administered medications for this visit.     Review of Systems Review of  Systems  She is an Airline pilotaccountant Married for 6 years No FH of breast cancer She [place] pap in [place] in 2017  Blood pressure 128/82, pulse 86, height 5\' 5"  (1.651 m), weight 267 lb (121.1 kg), last menstrual period 05/08/2017.  Physical Exam Physical Exam  Breathing, conversing, and ambulating normally Well nourished, well hydrated White female, no apparent distress Pelvic deferred at her request due to the heaviness of her period (currently bleeding)  Data Reviewed None available  Assessment    menorrhagia    Plan    Lo ovral Come back in 4 weeks for BP check and pelvic exam CBC       Carrie Buck C Draedyn Weidinger 05/09/2017, 1:17 PM

## 2017-05-10 LAB — CBC
Hematocrit: 38.2 % (ref 34.0–46.6)
Hemoglobin: 12.6 g/dL (ref 11.1–15.9)
MCH: 29.1 pg (ref 26.6–33.0)
MCHC: 33 g/dL (ref 31.5–35.7)
MCV: 88 fL (ref 79–97)
Platelets: 269 x10E3/uL (ref 150–379)
RBC: 4.33 x10E6/uL (ref 3.77–5.28)
RDW: 14.8 % (ref 12.3–15.4)
WBC: 7.2 x10E3/uL (ref 3.4–10.8)

## 2017-05-23 DIAGNOSIS — Z13220 Encounter for screening for lipoid disorders: Secondary | ICD-10-CM | POA: Diagnosis not present

## 2017-05-23 DIAGNOSIS — Z0000 Encounter for general adult medical examination without abnormal findings: Secondary | ICD-10-CM | POA: Diagnosis not present

## 2017-05-23 DIAGNOSIS — Z1159 Encounter for screening for other viral diseases: Secondary | ICD-10-CM | POA: Diagnosis not present

## 2017-05-28 ENCOUNTER — Encounter: Payer: 59 | Attending: Surgery | Admitting: Registered"

## 2017-05-28 ENCOUNTER — Encounter: Payer: Self-pay | Admitting: Registered"

## 2017-05-28 DIAGNOSIS — E669 Obesity, unspecified: Secondary | ICD-10-CM

## 2017-05-28 DIAGNOSIS — Z713 Dietary counseling and surveillance: Secondary | ICD-10-CM | POA: Insufficient documentation

## 2017-05-28 DIAGNOSIS — Z9884 Bariatric surgery status: Secondary | ICD-10-CM | POA: Insufficient documentation

## 2017-05-28 NOTE — Patient Instructions (Addendum)
-   Meal plan on weekends. Shop weekly and freeze leftovers (if needed).    - Aim to have breakfast consistently.   - Snack on non-starchy vegetables like tomatoes and hummus, etc.   - Aim to meet protein goals, 60 grams/day.    - Aim to not drink with meals.

## 2017-05-28 NOTE — Progress Notes (Signed)
Follow-up visit:  9 Years Post-Operative LAGB Surgery  Medical Nutrition Therapy:  Appt start time: 4:15 end time:  5:10.  Primary concerns today: Post-operative Bariatric Surgery Nutrition Management.  Non scale victories: none stated  Surgery date: 2010 Surgery type: LAGB (in [place]) Start weight at [facility]: 268.6 lbs Weight today: 268.6 lbs Weight change: N/A Total weight lost: N/A Weight loss goal: none stated   TANITA  BODY COMP RESULTS  05/28/2017   BMI (kg/m^2) 43.4   Fat Mass (lbs) 127.4   Fat Free Mass (lbs) 141.2   Total Body Water (lbs) 104.4   Pt states she has not had LAGB care for the last 4 years.   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 Pt states she has 2 daughters, ages 45 and 66. Pt states she and her daughters recently planted 9 tomato plants.   Pt states she avoids fried foods and has eliminated sodas. Pt states she is not that hungry in the morning for breakfast. Pt states she gets burned out when eating the same thing more than 2 consecutive days. Pt states they grocery shop daily or every other day. Pt states she eats seafood/chicken a few times a week along with vegetables. Pt states she knows she is not meeting her protein goals daily. Pt states she is trying to go back to vegetarianism because sometimes she does not like the smell of meat. Pt states she only likes [employer]' greek yogurt.   Preferred Learning Style:   No preference indicated   Learning Readiness:   Ready  Change in progress  24-hr recall: B (AM): typically skips during the week; sometimes Premier Protein (30g) or coffee (creamer) Snk (AM): none  L (PM): [place] roll-up or salad or steak, vegetable or soup Snk (PM): sometimes grazes on  D (5:30 PM): grilled steak, steamed broccoli, grilled carrots or taco salad (meat, beans, olives, spinach) Snk (PM): sometimes deli meat/cheese or popcorn  Fluid intake: water (2-4 liters/day;  64-128 oz), 2 cups of coffee (16 oz), wine (glass/day); 64+ ounces  Estimated total protein intake: ~20 grams per pt  Medications: See list Supplementation: none  Using straws: yes Drinking while eating: yes Having you been chewing well: yes, states she tries to stay aware Chewing/swallowing difficulties: no Changes in vision: no Changes to mood/headaches: no Hair loss/Changes to skin/Changes to nails: no, no, no Any difficulty focusing or concentrating: no Sweating: no Dizziness/Lightheaded: no Palpitations: no  Carbonated beverages: once a week N/V/D/C/GAS: no, no, no, no, yes-recently when waking up Abdominal Pain: no Dumping syndrome: no Last Lap-Band fill: 04/2017  Recent physical activity:  Walking 15 min/day 5 days/week, yard work once a week  Progress Towards Goal(s):  In progress.  Handouts given during visit include:  none   Nutritional Diagnosis:  NI-5.7.1 Inadequate protein intake As related to bariatric surgery post-op recommendations.  As evidenced by pt report of less than 60 grams of protein a day.    Intervention:  Nutrition education and counseling. Pt was educated and counseled on the importance of consuming 3 meals a day, not drinking with meals, snacking on non-starchy vegetables, ways to meal plan, and create more time during her week.  Goals: - Meal plan on weekends. Shop weekly and freeze leftovers (if needed).   - Aim to have breakfast consistently.  - Snack on non-starchy vegetables like tomatoes and hummus, etc.  - Aim to meet protein goals, 60 grams/day.   - Aim to  not drink with meals.   Teaching Method Utilized:  Visual Auditory Hands on  Barriers to learning/adherence to lifestyle change: none identified  Demonstrated degree of understanding via:  Teach Back   Monitoring/Evaluation:  Dietary intake, exercise, lap band fills, and body weight. Follow up in 1 months for 9 year post-op visit.

## 2017-06-07 ENCOUNTER — Encounter: Payer: Self-pay | Admitting: *Deleted

## 2017-06-11 ENCOUNTER — Ambulatory Visit (INDEPENDENT_AMBULATORY_CARE_PROVIDER_SITE_OTHER): Payer: 59 | Admitting: Obstetrics & Gynecology

## 2017-06-11 ENCOUNTER — Encounter: Payer: Self-pay | Admitting: Obstetrics & Gynecology

## 2017-06-11 VITALS — BP 127/83 | HR 75 | Ht 66.0 in | Wt 267.0 lb

## 2017-06-11 DIAGNOSIS — Z124 Encounter for screening for malignant neoplasm of cervix: Secondary | ICD-10-CM | POA: Diagnosis not present

## 2017-06-11 DIAGNOSIS — Z01419 Encounter for gynecological examination (general) (routine) without abnormal findings: Secondary | ICD-10-CM

## 2017-06-11 DIAGNOSIS — Z113 Encounter for screening for infections with a predominantly sexual mode of transmission: Secondary | ICD-10-CM

## 2017-06-11 DIAGNOSIS — Z1151 Encounter for screening for human papillomavirus (HPV): Secondary | ICD-10-CM | POA: Diagnosis not present

## 2017-06-11 DIAGNOSIS — Z6841 Body Mass Index (BMI) 40.0 and over, adult: Secondary | ICD-10-CM | POA: Insufficient documentation

## 2017-06-11 NOTE — Progress Notes (Signed)
   Subjective:    Patient ID: Carrie Buck, female    DOB: [birth year], 37 y.o.   MRN: [id number]  HPI  37 yo married P2 here for a pap smear. She had her annual exam with me 05/09/17 but deferred her pap and pelvic exam due to her period.    Review of Systems     Objective:   Physical Exam  Breathing, conversing, and ambulating normally Well nourished, well hydrated White female, no apparent distress Cervix, vagina, vulva appear normal Bimanual exam reveals no masses or tenderness    Assessment & Plan:  Preventative care- pap smear with cotesting today

## 2017-06-12 LAB — CYTOLOGY - PAP
Chlamydia: NEGATIVE
Diagnosis: NEGATIVE
HPV (WINDOPATH): NOT DETECTED
Neisseria Gonorrhea: NEGATIVE

## 2017-06-21 ENCOUNTER — Encounter: Payer: Self-pay | Admitting: *Deleted

## 2017-06-22 ENCOUNTER — Other Ambulatory Visit: Payer: Self-pay | Admitting: Obstetrics & Gynecology

## 2017-06-27 ENCOUNTER — Ambulatory Visit: Payer: 59 | Admitting: Registered"

## 2017-07-17 DIAGNOSIS — M79672 Pain in left foot: Secondary | ICD-10-CM | POA: Diagnosis not present

## 2017-07-17 DIAGNOSIS — M722 Plantar fascial fibromatosis: Secondary | ICD-10-CM | POA: Diagnosis not present

## 2017-07-27 DIAGNOSIS — Z4651 Encounter for fitting and adjustment of gastric lap band: Secondary | ICD-10-CM | POA: Diagnosis not present

## 2017-07-31 DIAGNOSIS — M722 Plantar fascial fibromatosis: Secondary | ICD-10-CM | POA: Diagnosis not present

## 2017-08-16 DIAGNOSIS — E559 Vitamin D deficiency, unspecified: Secondary | ICD-10-CM | POA: Diagnosis not present

## 2017-08-21 DIAGNOSIS — M79672 Pain in left foot: Secondary | ICD-10-CM | POA: Diagnosis not present

## 2017-08-21 DIAGNOSIS — M722 Plantar fascial fibromatosis: Secondary | ICD-10-CM | POA: Diagnosis not present

## 2017-09-11 DIAGNOSIS — M722 Plantar fascial fibromatosis: Secondary | ICD-10-CM | POA: Diagnosis not present

## 2017-09-20 DIAGNOSIS — Z8371 Family history of colonic polyps: Secondary | ICD-10-CM | POA: Diagnosis not present

## 2017-09-20 DIAGNOSIS — R194 Change in bowel habit: Secondary | ICD-10-CM | POA: Diagnosis not present

## 2017-10-19 DIAGNOSIS — K625 Hemorrhage of anus and rectum: Secondary | ICD-10-CM | POA: Diagnosis not present

## 2017-10-19 DIAGNOSIS — R194 Change in bowel habit: Secondary | ICD-10-CM | POA: Diagnosis not present

## 2017-10-19 DIAGNOSIS — K635 Polyp of colon: Secondary | ICD-10-CM | POA: Diagnosis not present

## 2017-11-07 DIAGNOSIS — Z23 Encounter for immunization: Secondary | ICD-10-CM | POA: Diagnosis not present

## 2017-11-13 DIAGNOSIS — M722 Plantar fascial fibromatosis: Secondary | ICD-10-CM | POA: Diagnosis not present

## 2017-11-13 DIAGNOSIS — M79672 Pain in left foot: Secondary | ICD-10-CM | POA: Diagnosis not present

## 2017-12-12 DIAGNOSIS — G43109 Migraine with aura, not intractable, without status migrainosus: Secondary | ICD-10-CM | POA: Diagnosis not present

## 2017-12-12 DIAGNOSIS — G479 Sleep disorder, unspecified: Secondary | ICD-10-CM | POA: Diagnosis not present

## 2017-12-19 DIAGNOSIS — J0110 Acute frontal sinusitis, unspecified: Secondary | ICD-10-CM | POA: Diagnosis not present

## 2018-01-01 DIAGNOSIS — M23331 Other meniscus derangements, other medial meniscus, right knee: Secondary | ICD-10-CM | POA: Diagnosis not present

## 2018-01-15 DIAGNOSIS — E559 Vitamin D deficiency, unspecified: Secondary | ICD-10-CM | POA: Diagnosis not present

## 2018-01-15 DIAGNOSIS — Z713 Dietary counseling and surveillance: Secondary | ICD-10-CM | POA: Diagnosis not present

## 2018-02-14 ENCOUNTER — Encounter (INDEPENDENT_AMBULATORY_CARE_PROVIDER_SITE_OTHER): Payer: Self-pay

## 2018-02-15 DIAGNOSIS — J040 Acute laryngitis: Secondary | ICD-10-CM | POA: Diagnosis not present

## 2018-02-20 ENCOUNTER — Ambulatory Visit (INDEPENDENT_AMBULATORY_CARE_PROVIDER_SITE_OTHER): Payer: 59 | Admitting: Family Medicine

## 2018-02-20 ENCOUNTER — Encounter (INDEPENDENT_AMBULATORY_CARE_PROVIDER_SITE_OTHER): Payer: Self-pay | Admitting: Family Medicine

## 2018-02-20 VITALS — BP 138/82 | HR 72 | Temp 98.0°F | Ht 66.0 in | Wt 260.0 lb

## 2018-02-20 DIAGNOSIS — Z0289 Encounter for other administrative examinations: Secondary | ICD-10-CM

## 2018-02-20 DIAGNOSIS — Z1331 Encounter for screening for depression: Secondary | ICD-10-CM

## 2018-02-20 DIAGNOSIS — Z9189 Other specified personal risk factors, not elsewhere classified: Secondary | ICD-10-CM | POA: Diagnosis not present

## 2018-02-20 DIAGNOSIS — R030 Elevated blood-pressure reading, without diagnosis of hypertension: Secondary | ICD-10-CM | POA: Diagnosis not present

## 2018-02-20 DIAGNOSIS — R5383 Other fatigue: Secondary | ICD-10-CM

## 2018-02-20 DIAGNOSIS — R0602 Shortness of breath: Secondary | ICD-10-CM | POA: Diagnosis not present

## 2018-02-20 DIAGNOSIS — R739 Hyperglycemia, unspecified: Secondary | ICD-10-CM | POA: Diagnosis not present

## 2018-02-20 DIAGNOSIS — Z6841 Body Mass Index (BMI) 40.0 and over, adult: Secondary | ICD-10-CM

## 2018-02-20 NOTE — Progress Notes (Signed)
Office: [phone]  /  Fax: [phone]   Dear Ann Maki. Donalee Citrin, [place],   Thank you for referring Alka Falwell to our clinic. The following note includes my evaluation and treatment recommendations.  HPI:   Chief Complaint: OBESITY    Keilana Morlock has been referred by Ann Maki. Donalee Citrin, PA for consultation regarding her obesity and obesity related comorbidities.    Kiearra Oyervides (MR# [id number]) is a 38 y.o. female who presents on 02/20/2018 for obesity evaluation and treatment. Current BMI is Body mass index is 41.97 kg/m.  Laranda has been struggling with her weight for many years and has been unsuccessful in either losing weight, maintaining weight loss, or reaching her healthy weight goal. Aarianna has been on Contrave for 3 months as she takes 1 tablet every morning. She is status post lap-band in 2010. Her weight went from 220 pounds to 215 pounds in 6 months and she is now at 260 pounds. She cannot eat bread due to the band.     Amayrani attended our information session and states she is currently in the action stage of change and ready to dedicate time achieving and maintaining a healthier weight. Courtni is interested in becoming our patient and working on intensive lifestyle modifications including (but not limited to) diet, exercise and weight loss.    Taylinn states her family eats meals together her desired weight loss is 90 lbs she has been heavy most of her life she started gaining weight after college her heaviest weight ever was 275 lbs. she has significant food cravings issues  she snacks frequently in the evenings she skips meals sometimes she is frequently drinking liquids with calories she frequently makes poor food choices she has binge eating behaviors she struggles with emotional eating    Fatigue Consuella feels her energy is lower than it should be. This has worsened with weight gain and has not worsened recently. Dennis admits to daytime somnolence and  admits to waking up still tired. Patient is at risk for obstructive sleep apnea. Patent has a history of symptoms of daytime fatigue. Patient generally gets 5 or 7 hours of sleep per night, and states they generally have restless sleep. Snoring is present. Apneic episodes are not present. Epworth Sleepiness Score is 4.  Dyspnea on exertion Alynn notes increasing shortness of breath with exercising and seems to be worsening over time with weight gain. She notes getting out of breath sooner with activity than she used to. This has not gotten worse recently. Gabrielle denies orthopnea.  Hyperglycemia Chalsea has a history of some elevated blood glucose readings without a diagnosis of diabetes. She was on metformin for 6 months and has been off for 2 years. She has a family history of diabetes. She admits to polyphagia.  At risk for diabetes Leeanna is at higher than average risk for developing diabetes due to her hyperglycemia and obesity. She currently denies polyuria or polydipsia.  Pre-Hypertension Adda's blood pressure is borderline elevated, which may be white coat syndrome. She denies chest pain and headaches.  Depression Screen Dmiyah's Food and Mood (modified PHQ-9) score was 14. Depression screen PHQ 2/9 02/20/2018  Decreased Interest 3  Down, Depressed, Hopeless 2  PHQ - 2 Score 5  Altered sleeping 2  Tired, decreased energy 3  Change in appetite 1  Feeling bad or failure about yourself  3  Trouble concentrating 0  Moving slowly or fidgety/restless 0  Suicidal thoughts 0  PHQ-9 Score 14  Difficult doing work/chores Not difficult  at all    ASSESSMENT AND PLAN:  Other fatigue - Plan: EKG 12-Lead, Vitamin B12, CBC With Differential, Folate, T3, T4, free, TSH, VITAMIN D 25 Hydroxy (Vit-D Deficiency, Fractures)  Shortness of breath on exertion - Plan: Lipid Panel With LDL/HDL Ratio  Hyperglycemia - Plan: Comprehensive metabolic panel, Hemoglobin A1c, Insulin,  random  Prehypertension  At risk for diabetes mellitus  Depression screening  Class 3 severe obesity with serious comorbidity and body mass index (BMI) of 40.0 to 44.9 in adult, unspecified obesity type (HCC)  PLAN:  Fatigue Saylor was informed that her fatigue may be related to obesity, depression or many other causes. Labs will be ordered, and in the meanwhile Calder has agreed to work on diet, exercise and weight loss to help with fatigue. Proper sleep hygiene was discussed including the need for 7-8 hours of quality sleep each night. A sleep study was not ordered based on symptoms and Epworth score. We ordered an EKG and an indirect calorimetry today. Dessire agrees to follow up in 2 weeks.  Dyspnea on exertion Katharyn's shortness of breath appears to be obesity related and exercise induced. She has agreed to work on weight loss and gradually increase exercise to treat her exercise induced shortness of breath. If Jesusa follows our instructions and loses weight without improvement of her shortness of breath, we will plan to refer to pulmonology. We will monitor this condition regularly. Labs, an indirect calorimetry, and an EKG was ordered today. Roxi agrees to this plan.  Hyperglycemia Fasting labs will be obtained and results with be discussed with [name] in 2 weeks at her follow up visit. In the meanwhile Mikella was started on a lower simple carbohydrate diet and will work on weight loss efforts. Jalexa agrees to start her diet prescription and she will follow up at the agreed upon time.  Diabetes risk counseling Hue was given extended (15 minutes) diabetes prevention counseling today. She is 38 y.o. female and has risk factors for diabetes including hyperglycemia and obesity. We discussed intensive lifestyle modifications today with an emphasis on weight loss as well as increasing exercise and decreasing simple carbohydrates in her diet.  Pre-Hypertension We will order  labs today and Lita agrees to start her diet. She agrees to follow up in 2 weeks.  Depression Screen Raynia had a moderately positive depression screening. Depression is commonly associated with obesity and often results in emotional eating behaviors. We will monitor this closely and work on CBT to help improve the non-hunger eating patterns. Referral to Psychology may be required if no improvement is seen as she continues in our clinic.  Obesity Jordian is currently in the action stage of change and her goal is to continue with weight loss efforts. I recommend Narita begin the structured treatment plan as follows:  She has agreed to follow the Category 2 plan + 100 calories. Jaicee has been instructed to eventually work up to a goal of 150 minutes of combined cardio and strengthening exercise per week for weight loss and overall health benefits. We discussed the following Behavioral Modification Strategies today: increasing lean protein intake, decreasing simple carbohydrates, and work on meal planning and easy cooking plans. We will hold on Contrave for the 1st 2 weeks and then we will reassess at her next appointment.   She was informed of the importance of frequent follow up visits to maximize her success with intensive lifestyle modifications for her multiple health conditions. She was informed we would discuss her lab results at  her next visit unless there is a critical issue that needs to be addressed sooner. Shanda BumpsJessica agreed to keep her next visit at the agreed upon time to discuss these results.  ALLERGIES: Allergies  Allergen Reactions  . Penicillins Rash    Has patient had a PCN reaction causing immediate rash, facial/tongue/throat swelling, SOB or lightheadedness with hypotension: Yes Has patient had a PCN reaction causing severe rash involving mucus membranes or skin necrosis: No Has patient had a PCN reaction that required hospitalization: No Has patient had a PCN reaction  occurring within the last 10 years: No If all of the above answers are "NO", then may proceed with Cephalosporin use.     MEDICATIONS: Current Outpatient Medications on File Prior to Visit  Medication Sig Dispense Refill  . CRYSELLE-28 0.3-30 MG-MCG tablet TAKE 1 TABLET BY MOUTH EVERY DAY 84 tablet 4  . Naltrexone-buPROPion HCl ER (CONTRAVE) 8-90 MG TB12 Take by mouth. Take 1-2 tablets by mouth daily     No current facility-administered medications on file prior to visit.     PAST MEDICAL HISTORY: Past Medical History:  Diagnosis Date  . Anxiety   . Constipation   . Generalized headaches   . Joint pain   . Knee pain   . LAP-BAND surgery status   . Leg edema   . Lower back pain   . Obesity   . Ovarian cyst   . Prediabetes   . Vitamin D deficiency     PAST SURGICAL HISTORY: Past Surgical History:  Procedure Laterality Date  . CESAREAN SECTION    . LAPAROSCOPIC APPENDECTOMY N/A 03/25/2017   Procedure: APPENDECTOMY LAPAROSCOPIC;  Surgeon: Ovidio KinNewman, David, MD;  Location: [facility];  Service: General;  Laterality: N/A;  . LAPAROSCOPIC GASTRIC BANDING    . NASAL SINUS SURGERY    . TUBAL LIGATION      SOCIAL HISTORY: Social History   Tobacco Use  . Smoking status: Never Smoker  . Smokeless tobacco: Never Used  Substance Use Topics  . Alcohol use: Yes    Frequency: Never    Comment: glass of wine with dinner  . Drug use: No    FAMILY HISTORY: Family History  Problem Relation Age of Onset  . Hypertension Mother   . Diabetes Mother   . Thyroid disease Mother   . Obesity Mother   . Hypertension Father   . Cancer Father     ROS: Review of Systems  Constitutional: Positive for malaise/fatigue. Negative for weight loss.  HENT: Positive for sinus pain.   Eyes:       Wears glasses and contacts. Positive for floaters.  Respiratory: Positive for shortness of breath ( with activity).   Cardiovascular: Negative for chest pain and orthopnea.  Genitourinary:        Negative for polyuria.  Neurological: Positive for dizziness and headaches.  Endo/Heme/Allergies: Negative for polydipsia.       Positive for hyperglycemia. Positive for polyphagia.  Psychiatric/Behavioral:       Positive for stress.    PHYSICAL EXAM: Blood pressure 138/82, pulse 72, temperature 98 F (36.7 C), temperature source Oral, height 5\' 6"  (1.676 m), weight 260 lb (117.9 kg), last menstrual period 02/13/2018, SpO2 96 %. Body mass index is 41.97 kg/m. Physical Exam Vitals signs reviewed.  Constitutional:      Appearance: Normal appearance. She is obese.  HENT:     Head: Normocephalic and atraumatic.     Nose: Nose normal.  Eyes:     General: No  scleral icterus.    Extraocular Movements: Extraocular movements intact.  Neck:     Musculoskeletal: Normal range of motion and neck supple.     Comments: Negative for thyromegaly. Cardiovascular:     Rate and Rhythm: Normal rate and regular rhythm.  Pulmonary:     Effort: Pulmonary effort is normal. No respiratory distress.  Abdominal:     Palpations: Abdomen is soft.     Tenderness: There is no abdominal tenderness.     Comments: Positive for obesity.  Musculoskeletal: Normal range of motion.     Comments: ROM normal in all extremities.  Skin:    General: Skin is warm and dry.  Neurological:     Mental Status: She is alert and oriented to person, place, and time.     Coordination: Coordination normal.  Psychiatric:        Mood and Affect: Mood normal.        Behavior: Behavior normal.     RECENT LABS AND TESTS: BMET    Component Value Date/Time   NA 136 03/25/2017 1749   K 3.7 03/25/2017 1749   CL 104 03/25/2017 1749   CO2 23 03/25/2017 1749   GLUCOSE 107 (H) 03/25/2017 1749   BUN 11 03/25/2017 1749   CREATININE 0.72 03/25/2017 1749   CALCIUM 9.3 03/25/2017 1749   GFRNONAA >60 03/25/2017 1749   GFRAA >60 03/25/2017 1749   No results found for: HGBA1C No results found for: INSULIN CBC    Component  Value Date/Time   WBC 7.2 05/09/2017 1334   WBC 7.4 03/25/2017 1749   RBC 4.33 05/09/2017 1334   RBC 4.37 03/25/2017 1749   HGB 12.6 05/09/2017 1334   HCT 38.2 05/09/2017 1334   PLT 269 05/09/2017 1334   MCV 88 05/09/2017 1334   MCH 29.1 05/09/2017 1334   MCH 29.1 03/25/2017 1749   MCHC 33.0 05/09/2017 1334   MCHC 33.0 03/25/2017 1749   RDW 14.8 05/09/2017 1334   Iron/TIBC/Ferritin/ %Sat No results found for: IRON, TIBC, FERRITIN, IRONPCTSAT Lipid Panel  No results found for: CHOL, TRIG, HDL, CHOLHDL, VLDL, LDLCALC, LDLDIRECT Hepatic Function Panel     Component Value Date/Time   PROT 7.7 03/25/2017 1749   ALBUMIN 4.2 03/25/2017 1749   AST 14 (L) 03/25/2017 1749   ALT 16 03/25/2017 1749   ALKPHOS 60 03/25/2017 1749   BILITOT 0.8 03/25/2017 1749   No results found for: TSH  ECG  shows NSR with a rate of 70 BPM. INDIRECT CALORIMETER done today shows a VO2 of 345 and a REE of 2404.  Her calculated basal metabolic rate is 1505 thus her basal metabolic rate is better than expected.   OBESITY BEHAVIORAL INTERVENTION VISIT  Today's visit was # 1  Starting weight: 260 lbs Starting date: 02/20/2018 Today's weight : Weight: 260 lb (117.9 kg)  Today's date: 02/20/2018 Total lbs lost to date: 0  ASK: We discussed the diagnosis of obesity with Lanny Hurst today and Maressa agreed to give [name] permission to discuss obesity behavioral modification therapy today.  ASSESS: Rondia has the diagnosis of obesity and her BMI today is 41.9. Kebra is in the action stage of change.   ADVISE: Kerin was educated on the multiple health risks of obesity as well as the benefit of weight loss to improve her health. She was advised of the need for long term treatment and the importance of lifestyle modifications to improve her current health and to decrease her risk of future health problems.  AGREE: Multiple dietary modification options and treatment options were discussed and Shanda BumpsJessica  agreed to follow the recommendations documented in the above note.  ARRANGE: Shanda BumpsJessica was educated on the importance of frequent visits to treat obesity as outlined per CMS and USPSTF guidelines and agreed to schedule her next follow up appointment today.  I, Kirke Corinara Soares, am acting as transcriptionist for Wilder Gladearen D. Marsella Suman, MD  I have reviewed the above documentation for accuracy and completeness, and I agree with the above. -Quillian Quincearen Shannan Slinker, MD

## 2018-02-21 LAB — COMPREHENSIVE METABOLIC PANEL
ALT: 33 IU/L — ABNORMAL HIGH (ref 0–32)
AST: 21 IU/L (ref 0–40)
Albumin/Globulin Ratio: 1.6 (ref 1.2–2.2)
Albumin: 4.6 g/dL (ref 3.5–5.5)
Alkaline Phosphatase: 61 IU/L (ref 39–117)
BUN/Creatinine Ratio: 10 (ref 9–23)
BUN: 9 mg/dL (ref 6–20)
Bilirubin Total: 0.5 mg/dL (ref 0.0–1.2)
CO2: 18 mmol/L — ABNORMAL LOW (ref 20–29)
Calcium: 9.7 mg/dL (ref 8.7–10.2)
Chloride: 103 mmol/L (ref 96–106)
Creatinine, Ser: 0.91 mg/dL (ref 0.57–1.00)
GFR, EST AFRICAN AMERICAN: 93 mL/min/{1.73_m2} (ref >59)
GFR, EST NON AFRICAN AMERICAN: 81 mL/min/{1.73_m2} (ref >59)
Globulin, Total: 2.9 g/dL (ref 1.5–4.5)
Glucose: 91 mg/dL (ref 65–99)
Potassium: 4.3 mmol/L (ref 3.5–5.2)
Sodium: 140 mmol/L (ref 134–144)
TOTAL PROTEIN: 7.5 g/dL (ref 6.0–8.5)

## 2018-02-21 LAB — CBC WITH DIFFERENTIAL
BASOS: 0 %
Basophils Absolute: 0 10*3/uL (ref 0.0–0.2)
EOS (ABSOLUTE): 0 10*3/uL (ref 0.0–0.4)
EOS: 0 %
Hematocrit: 40.5 % (ref 34.0–46.6)
Hemoglobin: 13.6 g/dL (ref 11.1–15.9)
IMMATURE GRANS (ABS): 0 10*3/uL (ref 0.0–0.1)
Immature Granulocytes: 0 %
LYMPHS: 31 %
Lymphocytes Absolute: 2.4 10*3/uL (ref 0.7–3.1)
MCH: 29.6 pg (ref 26.6–33.0)
MCHC: 33.6 g/dL (ref 31.5–35.7)
MCV: 88 fL (ref 79–97)
Monocytes Absolute: 0.5 10*3/uL (ref 0.1–0.9)
Monocytes: 7 %
NEUTROS ABS: 4.7 10*3/uL (ref 1.4–7.0)
NEUTROS PCT: 62 %
RBC: 4.59 x10E6/uL (ref 3.77–5.28)
RDW: 12.7 % (ref 11.7–15.4)
WBC: 7.6 10*3/uL (ref 3.4–10.8)

## 2018-02-21 LAB — VITAMIN D 25 HYDROXY (VIT D DEFICIENCY, FRACTURES): Vit D, 25-Hydroxy: 31.2 ng/mL (ref 30.0–100.0)

## 2018-02-21 LAB — LIPID PANEL WITH LDL/HDL RATIO
Cholesterol, Total: 182 mg/dL (ref 100–199)
HDL: 57 mg/dL (ref >39)
LDL Calculated: 88 mg/dL (ref 0–99)
LDl/HDL Ratio: 1.5 ratio (ref 0.0–3.2)
Triglycerides: 184 mg/dL — ABNORMAL HIGH (ref 0–149)
VLDL Cholesterol Cal: 37 mg/dL (ref 5–40)

## 2018-02-21 LAB — VITAMIN B12: Vitamin B-12: 749 pg/mL (ref 232–1245)

## 2018-02-21 LAB — HEMOGLOBIN A1C
Est. average glucose Bld gHb Est-mCnc: 103 mg/dL
Hgb A1c MFr Bld: 5.2 % (ref 4.8–5.6)

## 2018-02-21 LAB — INSULIN, RANDOM: INSULIN: 15.3 u[IU]/mL (ref 2.6–24.9)

## 2018-02-21 LAB — TSH: TSH: 2.68 u[IU]/mL (ref 0.450–4.500)

## 2018-02-21 LAB — FOLATE: Folate: >20 ng/mL (ref >3.0)

## 2018-02-21 LAB — T3: T3, Total: 148 ng/dL (ref 71–180)

## 2018-02-21 LAB — T4, FREE: Free T4: 1.05 ng/dL (ref 0.82–1.77)

## 2018-02-23 ENCOUNTER — Encounter (INDEPENDENT_AMBULATORY_CARE_PROVIDER_SITE_OTHER): Payer: Self-pay | Admitting: Family Medicine

## 2018-03-06 ENCOUNTER — Ambulatory Visit (INDEPENDENT_AMBULATORY_CARE_PROVIDER_SITE_OTHER): Payer: 59 | Admitting: Family Medicine

## 2018-03-06 ENCOUNTER — Encounter (INDEPENDENT_AMBULATORY_CARE_PROVIDER_SITE_OTHER): Payer: Self-pay | Admitting: Family Medicine

## 2018-03-06 VITALS — BP 160/78 | HR 71 | Temp 97.7°F | Ht 66.0 in | Wt 258.0 lb

## 2018-03-06 DIAGNOSIS — R7989 Other specified abnormal findings of blood chemistry: Secondary | ICD-10-CM

## 2018-03-06 DIAGNOSIS — E781 Pure hyperglyceridemia: Secondary | ICD-10-CM

## 2018-03-06 DIAGNOSIS — Z9189 Other specified personal risk factors, not elsewhere classified: Secondary | ICD-10-CM

## 2018-03-06 DIAGNOSIS — Z6841 Body Mass Index (BMI) 40.0 and over, adult: Secondary | ICD-10-CM

## 2018-03-06 DIAGNOSIS — R945 Abnormal results of liver function studies: Secondary | ICD-10-CM

## 2018-03-06 DIAGNOSIS — E559 Vitamin D deficiency, unspecified: Secondary | ICD-10-CM

## 2018-03-06 DIAGNOSIS — E8881 Metabolic syndrome: Secondary | ICD-10-CM

## 2018-03-06 MED ORDER — VITAMIN D (ERGOCALCIFEROL) 1.25 MG (50000 UNIT) PO CAPS: 50000.0000 [IU] | ORAL_CAPSULE | ORAL | 0 refills | Status: DC

## 2018-03-07 ENCOUNTER — Encounter (INDEPENDENT_AMBULATORY_CARE_PROVIDER_SITE_OTHER): Payer: Self-pay | Admitting: Family Medicine

## 2018-03-07 NOTE — Progress Notes (Signed)
Office: [phone]  /  Fax: [phone]   HPI:   Chief Complaint: OBESITY Carrie Buck is here to discuss her progress with her obesity treatment plan. She is on the Category 2 plan and is following her eating plan approximately 80 to 85 % of the time. She states she is walking 15 to 20 minutes 2 to 3 times per week. Carrie Buck struggled to follow her plan closely due to her lap-band being very tight and she vomited about half an hour after meals. She started to use protein shakes to help increase protein.  Her weight is 258 lb (117 kg) today and has had a weight loss of 2 pounds over a period of 2 weeks since her last visit. She has lost 2  lbs since starting treatment with [name].  Elevated Liver Function Tests Carrie Buck's ALT was slightly elevated at 33 on 02/20/17, which is likely due to fatty liver. She denies abdominal pain and jaundice.  Elevated Triglycerides Carrie Buck has a history of elevated triglycerides. She is not on cholesterol medicines and denies abdominal pain.  Vitamin D deficiency Carrie Buck has a diagnosis of vitamin D deficiency. She is not currently taking vit D and her level is below goal. She admits fatigue.  Insulin Resistance Carrie Buck has a new diagnosis of insulin resistance based on her elevated fasting insulin level >5. Although Carrie Buck's blood glucose readings are still under good control, insulin resistance puts her at greater risk of metabolic syndrome and diabetes. Her fasting Insulin was elevated at 15.3. She is not taking metformin currently and continues to work on diet and exercise to decrease risk of diabetes. She denies polyphagia while she is on her diet prescription.  At risk for diabetes Carrie Buck is at higher than average risk for developing diabetes due to her insulin resistance and obesity. She currently denies polyuria or polydipsia.  ASSESSMENT AND PLAN:  Elevated LFTs  Hypertriglyceridemia  Vitamin D deficiency - Plan: Vitamin D, Ergocalciferol,  (DRISDOL) 1.25 MG (50000 UT) CAPS capsule  Insulin resistance  At risk for diabetes mellitus  Class 3 severe obesity with serious comorbidity and body mass index (BMI) of 40.0 to 44.9 in adult, unspecified obesity type (HCC)  PLAN:  Elevated Liver Function Tests Carrie Buck agreed to continue with diet and weight loss. We will recheck labs in 3 months and she will follow up in 3 weeks.  Elevated Triglycerides Carrie Buck agrees to continue with her diet and we will recheck labs in 3 months. She agrees to follow up as directed.  Vitamin D Deficiency Carrie Buck was informed that low vitamin D levels contributes to fatigue and are associated with obesity, breast, and colon cancer. She agrees to continue to take prescription Vit D @50 ,000 IU every week #4 with no refills and will follow up for routine testing of vitamin D, at least 2-3 times per year. She was informed of the risk of over-replacement of vitamin D and agrees to not increase her dose unless she discusses this with [name] first. Carrie Buck agrees to follow up in 3 weeks.  Insulin Resistance Carrie Buck will continue to work on weight loss, exercise, and decreasing simple carbohydrates in her diet to help decrease the risk of diabetes. We discussed metformin including benefits and risks. She was informed that eating too many simple carbohydrates or too many calories at one sitting increases the likelihood of GI side effects. Carrie Buck agrees to continue her diet and exercise for now and a prescription was deferred today. We will check labs in 3 months and  Carrie Buck agreed to follow up with us as directed to monitor her progress.  Diabetes risk counseling Carrie Buck was given extended (30 minutes) diabetes prevention counseling today. She is 38 y.o. female and has risk factors for diabetes including insulin resistance and obesity. We discussed intensive lifestyle modifications today with an emphasis on weight loss as well as increasing exercise and decreasing  simple carbohydrates in her diet.  Obesity Carrie Buck is currently in the action stage of change. As such, her goal is to continue with weight loss efforts. She has agreed to keep a food journal with 1400 to 1600 calories and 80+ grams of protein.  Carrie Buck has been instructed to work up to a goal of 150 minutes of combined cardio and strengthening exercise per week for weight loss and overall health benefits. We discussed the following Behavioral Modification Strategies today: increasing lean protein intake, decreasing simple carbohydrates, and work on meal planning and easy cooking plans Carrie Buck is to have her band loosened by her surgeon and we will reassess her ability to eat solid food.  Carrie Buck has agreed to follow up with our clinic in 3 weeks. She was informed of the importance of frequent follow up visits to maximize her success with intensive lifestyle modifications for her multiple health conditions.  ALLERGIES: Allergies  Allergen Reactions  . Penicillins Rash    Has patient had a PCN reaction causing immediate rash, facial/tongue/throat swelling, SOB or lightheadedness with hypotension: Yes Has patient had a PCN reaction causing severe rash involving mucus membranes or skin necrosis: No Has patient had a PCN reaction that required hospitalization: No Has patient had a PCN reaction occurring within the last 10 years: No If all of the above answers are "NO", then may proceed with Cephalosporin use.     MEDICATIONS: Current Outpatient Medications on File Prior to Visit  Medication Sig Dispense Refill  . CRYSELLE-28 0.3-30 MG-MCG tablet TAKE 1 TABLET BY MOUTH EVERY DAY 84 tablet 4  . Naltrexone-buPROPion HCl ER (CONTRAVE) 8-90 MG TB12 Take by mouth. Take 1-2 tablets by mouth daily     No current facility-administered medications on file prior to visit.     PAST MEDICAL HISTORY: Past Medical History:  Diagnosis Date  . Anxiety   . Constipation   . Generalized headaches   .  Joint pain   . Knee pain   . LAP-BAND surgery status   . Leg edema   . Lower back pain   . Obesity   . Ovarian cyst   . Prediabetes   . Vitamin D deficiency     PAST SURGICAL HISTORY: Past Surgical History:  Procedure Laterality Date  . CESAREAN SECTION    . LAPAROSCOPIC APPENDECTOMY N/A 03/25/2017   Procedure: APPENDECTOMY LAPAROSCOPIC;  Surgeon: Carrie Buck, David, Carrie Buck;  Location: [facility];  Service: General;  Laterality: N/A;  . LAPAROSCOPIC GASTRIC BANDING    . NASAL SINUS SURGERY    . TUBAL LIGATION      SOCIAL HISTORY: Social History   Tobacco Use  . Smoking status: Never Smoker  . Smokeless tobacco: Never Used  Substance Use Topics  . Alcohol use: Yes    Frequency: Never    Comment: glass of wine with dinner  . Drug use: No    FAMILY HISTORY: Family History  Problem Relation Age of Onset  . Hypertension Mother   . Diabetes Mother   . Thyroid disease Mother   . Obesity Mother   . Hypertension Father   . Cancer Father  ROS: Review of Systems  Constitutional: Positive for malaise/fatigue and weight loss.  Gastrointestinal: Positive for vomiting. Negative for abdominal pain.       Negative for jaundice.  Genitourinary:       Negative for polyuria.  Endo/Heme/Allergies: Negative for polydipsia.       Negative for polyphagia.    PHYSICAL EXAM: Blood pressure (!) 160/78, pulse 71, temperature 97.7 F (36.5 C), temperature source Oral, height 5\' 6"  (1.676 m), weight 258 lb (117 kg), last menstrual period 02/13/2018, SpO2 97 %. Body mass index is 41.64 kg/m. Physical Exam Vitals signs reviewed.  Constitutional:      Appearance: Normal appearance. She is obese.  Cardiovascular:     Rate and Rhythm: Normal rate.  Pulmonary:     Effort: Pulmonary effort is normal.  Musculoskeletal: Normal range of motion.  Skin:    General: Skin is warm and dry.  Neurological:     Mental Status: She is alert and oriented to person, place, and time.  Psychiatric:         Mood and Affect: Mood normal.        Behavior: Behavior normal.     RECENT LABS AND TESTS: BMET    Component Value Date/Time   NA 140 02/20/2018 0948   K 4.3 02/20/2018 0948   CL 103 02/20/2018 0948   CO2 18 (L) 02/20/2018 0948   GLUCOSE 91 02/20/2018 0948   GLUCOSE 107 (H) 03/25/2017 1749   BUN 9 02/20/2018 0948   CREATININE 0.91 02/20/2018 0948   CALCIUM 9.7 02/20/2018 0948   GFRNONAA 81 02/20/2018 0948   GFRAA 93 02/20/2018 0948   Lab Results  Component Value Date   HGBA1C 5.2 02/20/2018   Lab Results  Component Value Date   INSULIN 15.3 02/20/2018   CBC    Component Value Date/Time   WBC 7.6 02/20/2018 0948   WBC 7.4 03/25/2017 1749   RBC 4.59 02/20/2018 0948   RBC 4.37 03/25/2017 1749   HGB 13.6 02/20/2018 0948   HCT 40.5 02/20/2018 0948   PLT 269 05/09/2017 1334   MCV 88 02/20/2018 0948   MCH 29.6 02/20/2018 0948   MCH 29.1 03/25/2017 1749   MCHC 33.6 02/20/2018 0948   MCHC 33.0 03/25/2017 1749   RDW 12.7 02/20/2018 0948   LYMPHSABS 2.4 02/20/2018 0948   EOSABS 0.0 02/20/2018 0948   BASOSABS 0.0 02/20/2018 0948   Iron/TIBC/Ferritin/ %Sat No results found for: IRON, TIBC, FERRITIN, IRONPCTSAT Lipid Panel     Component Value Date/Time   CHOL 182 02/20/2018 0948   TRIG 184 (H) 02/20/2018 0948   HDL 57 02/20/2018 0948   LDLCALC 88 02/20/2018 0948   Hepatic Function Panel     Component Value Date/Time   PROT 7.5 02/20/2018 0948   ALBUMIN 4.6 02/20/2018 0948   AST 21 02/20/2018 0948   ALT 33 (H) 02/20/2018 0948   ALKPHOS 61 02/20/2018 0948   BILITOT 0.5 02/20/2018 0948      Component Value Date/Time   TSH 2.680 02/20/2018 0948   Results for MERRILL, KRUPA (MRN [id number]) as of 03/07/2018 10:09  Ref. Range 02/20/2018 09:48  Vitamin D, 25-Hydroxy Latest Ref Range: 30.0 - 100.0 ng/mL 31.2    OBESITY BEHAVIORAL INTERVENTION VISIT  Today's visit was # 2   Starting weight: 260 lbs Starting date: 02/20/18 Today's weight : Weight: 258 lb (117  kg)  Today's date: 03/06/2018 Total lbs lost to date: 2  ASK: We discussed the diagnosis of obesity with Carrie Bumps  Borello today and Audriana agreed to give [name] permission to discuss obesity behavioral modification therapy today.  ASSESS: Rosy has the diagnosis of obesity and her BMI today is 41.6. Mersadies is in the action stage of change.  ADVISE: Shevawn was educated on the multiple health risks of obesity as well as the benefit of weight loss to improve her health. She was advised of the need for long term treatment and the importance of lifestyle modifications to improve her current health and to decrease her risk of future health problems.  AGREE: Multiple dietary modification options and treatment options were discussed and Emera agreed to follow the recommendations documented in the above note.  ARRANGE: Conita was educated on the importance of frequent visits to treat obesity as outlined per CMS and USPSTF guidelines and agreed to schedule her next follow up appointment today.  IKirke Corin, CMA, am acting as transcriptionist for Wilder Glade, Carrie Buck  I have reviewed the above documentation for accuracy and completeness, and I agree with the above. -Quillian Quince, Carrie Buck

## 2018-03-27 DIAGNOSIS — Z9884 Bariatric surgery status: Secondary | ICD-10-CM | POA: Diagnosis not present

## 2018-03-27 DIAGNOSIS — Z6841 Body Mass Index (BMI) 40.0 and over, adult: Secondary | ICD-10-CM | POA: Diagnosis not present

## 2018-03-28 ENCOUNTER — Encounter (INDEPENDENT_AMBULATORY_CARE_PROVIDER_SITE_OTHER): Payer: Self-pay | Admitting: Family Medicine

## 2018-03-28 ENCOUNTER — Ambulatory Visit (INDEPENDENT_AMBULATORY_CARE_PROVIDER_SITE_OTHER): Payer: 59 | Admitting: Family Medicine

## 2018-03-28 VITALS — BP 122/77 | HR 83 | Ht 66.0 in | Wt 257.0 lb

## 2018-03-28 DIAGNOSIS — Z6841 Body Mass Index (BMI) 40.0 and over, adult: Secondary | ICD-10-CM

## 2018-03-28 DIAGNOSIS — E559 Vitamin D deficiency, unspecified: Secondary | ICD-10-CM | POA: Diagnosis not present

## 2018-03-28 DIAGNOSIS — Z9189 Other specified personal risk factors, not elsewhere classified: Secondary | ICD-10-CM

## 2018-03-28 DIAGNOSIS — F3289 Other specified depressive episodes: Secondary | ICD-10-CM | POA: Diagnosis not present

## 2018-03-28 MED ORDER — BUPROPION HCL ER (SR) 150 MG PO TB12: 150.0000 mg | ORAL_TABLET | Freq: Every day | ORAL | 0 refills | Status: DC

## 2018-03-28 MED ORDER — VITAMIN D (ERGOCALCIFEROL) 1.25 MG (50000 UNIT) PO CAPS: 50000.0000 [IU] | ORAL_CAPSULE | ORAL | 0 refills | Status: DC

## 2018-04-01 NOTE — Progress Notes (Signed)
Office: [phone]  /  Fax: [phone]   HPI:   Chief Complaint: OBESITY Carrie Buck is here to discuss her progress with her obesity treatment plan. She is keeping a food journal with 1400-1600 calories and 80 grams of protein and is following her eating plan approximately 50% of the time. She states she is walking 20 minutes 5 times per week. Carrie Buck had her band adjusted yesterday and decreased by 0.8 cc's. The patient can now keep down food and liquids. She is happy with journaling and working on increasing protein. Her weight is 257 lb (116.6 kg) today and has had a weight loss of 1 pound over a period of 3 weeks since her last visit. She has lost 3 lbs since starting treatment with [name].  Vitamin D deficiency Carrie Buck has a diagnosis of Vitamin D deficiency. She is currently stable on Vitamin but not yet at goal. Carrie Buck taking prescription Vit D and denies nausea, vomiting or muscle weakness.  At risk for osteopenia and osteoporosis [name] is at higher risk of osteopenia and osteoporosis due to vitamin D deficiency.   Depression with emotional eating behaviors [name] was on Contrave but had nausea. She has stopped this but is still is struggling with emotional eating.   Depression screen [name] 2/9 02/20/2018 06/11/2017 05/28/2017 05/09/2017  Decreased Interest 3 0 0 0  Down, Depressed, Hopeless 2 0 0 0  PHQ - 2 Score 5 0 0 0  Altered sleeping 2 2 0 3  Tired, decreased energy 3 0 0 1  Change in appetite 1 0 0 0  Feeling bad or failure about yourself  3 0 0 0  Trouble concentrating 0 0 0 0  Moving slowly or fidgety/restless 0 0 0 0  Suicidal thoughts 0 0 0 0  PHQ-9 Score 14 2 0 4  Difficult doing work/chores Not difficult at all - - -   ASSESSMENT AND PLAN:  Vitamin D deficiency - Plan: Vitamin D, Ergocalciferol, (DRISDOL) 1.25 MG (50000 UT) CAPS capsule  Other depression - with emotional eating - Plan: buPROPion (WELLBUTRIN SR) 150 MG 12 hr tablet  At risk for  osteoporosis  Class 3 severe obesity with serious comorbidity and body mass index (BMI) of 40.0 to 44.9 in adult, unspecified obesity type (HCC)  PLAN:  Vitamin D Deficiency Carrie Buck was informed that low Vitamin D levels contributes to fatigue and are associated with obesity, breast, and colon cancer. She agrees to continue to take prescription Vit D @ 50,000 IU every week #4 with no refills and will follow-up for routine testing of Vitamin D, at least 2-3 times per year. She was informed of the risk of over-replacement of Vitamin D and agrees to not increase her dose unless she discusses this with [name] first. [name] agrees to follow-up with our clinic in 2-3 weeks.  At risk for osteopenia and osteoporosis Carrie Buck was given extended  (15 minutes) osteoporosis prevention counseling today. Carrie Buck is at risk for osteopenia and osteoporsis due to her Vitamin D deficiency. She was encouraged to take her Vitamin D and follow her higher calcium diet and increase strengthening exercise to help strengthen her bones and decrease her risk of osteopenia and osteoporosis.  Depression with Emotional Eating Behaviors We discussed behavior modification techniques today to help Carrie Buck deal with her emotional eating and depression. She has agreed to start Wellbutrin SR 150 mg qam #30 with no refills and agrees to follow-up with our clinic in 2-3 weeks.   Obesity Carrie Buck is currently in  the action stage of change. As such, her goal is to continue with weight loss efforts. She has agreed to keep a food journal with 1400-1600 calories and 80 grams of protein. Carrie Buck has been instructed to work up to a goal of 150 minutes of combined cardio and strengthening exercise per week for weight loss and overall health benefits. We discussed the following Behavioral Modification Strategies today: increasing lean protein intake, decreasing simple carbohydrates, work on meal planning and easy cooking plans, and emotional  eating strategies.  Carrie Buck has agreed to follow-up with our clinic in 2-3 weeks. She was informed of the importance of frequent follow up visits to maximize her success with intensive lifestyle modifications for her multiple health conditions.  ALLERGIES: Allergies  Allergen Reactions  . Penicillins Rash    Has patient had a PCN reaction causing immediate rash, facial/tongue/throat swelling, SOB or lightheadedness with hypotension: Yes Has patient had a PCN reaction causing severe rash involving mucus membranes or skin necrosis: No Has patient had a PCN reaction that required hospitalization: No Has patient had a PCN reaction occurring within the last 10 years: No If all of the above answers are "NO", then may proceed with Cephalosporin use.     MEDICATIONS: Current Outpatient Medications on File Prior to Visit  Medication Sig Dispense Refill  . CRYSELLE-28 0.3-30 MG-MCG tablet TAKE 1 TABLET BY MOUTH EVERY DAY 84 tablet 4   No current facility-administered medications on file prior to visit.     PAST MEDICAL HISTORY: Past Medical History:  Diagnosis Date  . Anxiety   . Constipation   . Generalized headaches   . Joint pain   . Knee pain   . LAP-BAND surgery status   . Leg edema   . Lower back pain   . Obesity   . Ovarian cyst   . Prediabetes   . Vitamin D deficiency     PAST SURGICAL HISTORY: Past Surgical History:  Procedure Laterality Date  . CESAREAN SECTION    . LAPAROSCOPIC APPENDECTOMY N/A 03/25/2017   Procedure: APPENDECTOMY LAPAROSCOPIC;  Surgeon: Ovidio Kin, MD;  Location: [facility];  Service: General;  Laterality: N/A;  . LAPAROSCOPIC GASTRIC BANDING    . NASAL SINUS SURGERY    . TUBAL LIGATION      SOCIAL HISTORY: Social History   Tobacco Use  . Smoking status: Never Smoker  . Smokeless tobacco: Never Used  Substance Use Topics  . Alcohol use: Yes    Frequency: Never    Comment: glass of wine with dinner  . Drug use: No    FAMILY  HISTORY: Family History  Problem Relation Age of Onset  . Hypertension Mother   . Diabetes Mother   . Thyroid disease Mother   . Obesity Mother   . Hypertension Father   . Cancer Father     ROS: Review of Systems  Constitutional: Positive for weight loss.  Gastrointestinal: Negative for nausea and vomiting.  Musculoskeletal:       Negative for muscle weakness.  Endo/Heme/Allergies:       Negative for hypoglycemia.  Psychiatric/Behavioral: Positive for depression (with emotional eating.).   PHYSICAL EXAM: Blood pressure 122/77, pulse 83, height  (1.676 m), weight 257 lb (116.6 kg), SpO2 97 %. Body mass index is 41.48 kg/m. Physical Exam Vitals signs reviewed.  Constitutional:      Appearance: Normal appearance. She is obese.  Cardiovascular:     Rate and Rhythm: Normal rate.     Pulses: Normal pulses.  Pulmonary:     Effort: Pulmonary effort is normal.     Breath sounds: Normal breath sounds.  Musculoskeletal: Normal range of motion.  Skin:    General: Skin is warm and dry.  Neurological:     Mental Status: She is alert and oriented to person, place, and time.  Psychiatric:        Behavior: Behavior normal.   RECENT LABS AND TESTS: BMET    Component Value Date/Time   NA 140 02/20/2018 0948   K 4.3 02/20/2018 0948   CL 103 02/20/2018 0948   CO2 18 (L) 02/20/2018 0948   GLUCOSE 91 02/20/2018 0948   GLUCOSE 107 (H) 03/25/2017 1749   BUN 9 02/20/2018 0948   CREATININE 0.91 02/20/2018 0948   CALCIUM 9.7 02/20/2018 0948   GFRNONAA 81 02/20/2018 0948   GFRAA 93 02/20/2018 0948   Lab Results  Component Value Date   HGBA1C 5.2 02/20/2018   Lab Results  Component Value Date   INSULIN 15.3 02/20/2018   CBC    Component Value Date/Time   WBC 7.6 02/20/2018 0948   WBC 7.4 03/25/2017 1749   RBC 4.59 02/20/2018 0948   RBC 4.37 03/25/2017 1749   HGB 13.6 02/20/2018 0948   HCT 40.5 02/20/2018 0948   PLT 269 05/09/2017 1334   MCV 88 02/20/2018 0948    MCH 29.6 02/20/2018 0948   MCH 29.1 03/25/2017 1749   MCHC 33.6 02/20/2018 0948   MCHC 33.0 03/25/2017 1749   RDW 12.7 02/20/2018 0948   LYMPHSABS 2.4 02/20/2018 0948   EOSABS 0.0 02/20/2018 0948   BASOSABS 0.0 02/20/2018 0948   Iron/TIBC/Ferritin/ %Sat No results found for: IRON, TIBC, FERRITIN, IRONPCTSAT Lipid Panel     Component Value Date/Time   CHOL 182 02/20/2018 0948   TRIG 184 (H) 02/20/2018 0948   HDL 57 02/20/2018 0948   LDLCALC 88 02/20/2018 0948   Hepatic Function Panel     Component Value Date/Time   PROT 7.5 02/20/2018 0948   ALBUMIN 4.6 02/20/2018 0948   AST 21 02/20/2018 0948   ALT 33 (H) 02/20/2018 0948   ALKPHOS 61 02/20/2018 0948   BILITOT 0.5 02/20/2018 0948      Component Value Date/Time   TSH 2.680 02/20/2018 0948    Ref. Range 02/20/2018 09:48  Vitamin D, 25-Hydroxy Latest Ref Range: 30.0 - 100.0 ng/mL 31.2   OBESITY BEHAVIORAL INTERVENTION VISIT  Today's visit was #3   Starting weight: 260 lbs Starting date: 02/20/2018 Today's weight: 257 lbs Today's date: 03/28/2018 Total lbs lost to date: 3    03/28/2018  Height 5\' 6"  (1.676 m)  Weight 257 lb (116.6 kg)  BMI (Calculated) 41.5  BLOOD PRESSURE - SYSTOLIC 122  BLOOD PRESSURE - DIASTOLIC 77   Body Fat % 46.6 %  Total Body Water (lbs) 99.2 lbs   ASK: We discussed the diagnosis of obesity with Lanny Hurst today and Debra agreed to give [name] permission to discuss obesity behavioral modification therapy today.  ASSESS: Tameika has the diagnosis of obesity and her BMI today is 41.48. Cressida is in the action stage of change.   ADVISE: Dynesty was educated on the multiple health risks of obesity as well as the benefit of weight loss to improve her health. She was advised of the need for long term treatment and the importance of lifestyle modifications to improve her current health and to decrease her risk of future health problems.  AGREE: Multiple dietary modification options and  treatment options  were discussed and  Avva agreed to follow the recommendations documented in the above note.  ARRANGE: Niyla was educated on the importance of frequent visits to treat obesity as outlined per CMS and USPSTF guidelines and agreed to schedule her next follow up appointment today.  I, [name], am acting as Energy manager for Quillian Quince, MD  I have reviewed the above documentation for accuracy and completeness, and I agree with the above. -Quillian Quince, MD

## 2018-04-17 ENCOUNTER — Encounter (INDEPENDENT_AMBULATORY_CARE_PROVIDER_SITE_OTHER): Payer: Self-pay

## 2018-04-17 ENCOUNTER — Ambulatory Visit (INDEPENDENT_AMBULATORY_CARE_PROVIDER_SITE_OTHER): Payer: 59 | Admitting: Family Medicine

## 2018-04-21 ENCOUNTER — Other Ambulatory Visit (INDEPENDENT_AMBULATORY_CARE_PROVIDER_SITE_OTHER): Payer: Self-pay | Admitting: Family Medicine

## 2018-04-21 DIAGNOSIS — F3289 Other specified depressive episodes: Secondary | ICD-10-CM

## 2018-04-26 ENCOUNTER — Other Ambulatory Visit (INDEPENDENT_AMBULATORY_CARE_PROVIDER_SITE_OTHER): Payer: Self-pay | Admitting: Family Medicine

## 2018-04-26 DIAGNOSIS — E559 Vitamin D deficiency, unspecified: Secondary | ICD-10-CM

## 2018-05-01 ENCOUNTER — Encounter (INDEPENDENT_AMBULATORY_CARE_PROVIDER_SITE_OTHER): Payer: Self-pay

## 2018-05-28 DIAGNOSIS — Z0000 Encounter for general adult medical examination without abnormal findings: Secondary | ICD-10-CM | POA: Diagnosis not present

## 2018-05-28 DIAGNOSIS — E559 Vitamin D deficiency, unspecified: Secondary | ICD-10-CM | POA: Diagnosis not present

## 2018-08-17 ENCOUNTER — Other Ambulatory Visit: Payer: Self-pay | Admitting: Obstetrics & Gynecology

## 2019-05-08 ENCOUNTER — Ambulatory Visit: Payer: Self-pay

## 2019-06-18 ENCOUNTER — Encounter (INDEPENDENT_AMBULATORY_CARE_PROVIDER_SITE_OTHER): Payer: Self-pay | Admitting: Family Medicine

## 2019-06-18 ENCOUNTER — Ambulatory Visit (INDEPENDENT_AMBULATORY_CARE_PROVIDER_SITE_OTHER): Payer: 59 | Admitting: Family Medicine

## 2019-06-18 ENCOUNTER — Other Ambulatory Visit: Payer: Self-pay

## 2019-06-18 VITALS — BP 136/86 | HR 73 | Temp 98.0°F | Ht 66.0 in | Wt 287.0 lb

## 2019-06-18 DIAGNOSIS — R7401 Elevation of levels of liver transaminase levels: Secondary | ICD-10-CM

## 2019-06-18 DIAGNOSIS — R5383 Other fatigue: Secondary | ICD-10-CM | POA: Diagnosis not present

## 2019-06-18 DIAGNOSIS — E559 Vitamin D deficiency, unspecified: Secondary | ICD-10-CM | POA: Diagnosis not present

## 2019-06-18 DIAGNOSIS — Z6841 Body Mass Index (BMI) 40.0 and over, adult: Secondary | ICD-10-CM

## 2019-06-18 DIAGNOSIS — R0602 Shortness of breath: Secondary | ICD-10-CM | POA: Diagnosis not present

## 2019-06-18 DIAGNOSIS — R7303 Prediabetes: Secondary | ICD-10-CM

## 2019-06-18 DIAGNOSIS — Z9189 Other specified personal risk factors, not elsewhere classified: Secondary | ICD-10-CM | POA: Diagnosis not present

## 2019-06-18 DIAGNOSIS — Z1331 Encounter for screening for depression: Secondary | ICD-10-CM

## 2019-06-18 DIAGNOSIS — Z0289 Encounter for other administrative examinations: Secondary | ICD-10-CM

## 2019-06-18 NOTE — Progress Notes (Signed)
Chief Complaint:   OBESITY Carrie Buck (MR# [id number]) is a 39 y.o. female who presents for evaluation and treatment of obesity and related comorbidities. Current BMI is Body mass index is 46.32 kg/m.Marland Kitchen Carrie Buck has been struggling with her weight for many years and has been unsuccessful in either losing weight, maintaining weight loss, or reaching her healthy weight goal.  Carrie Buck is currently in the action stage of change and ready to dedicate time achieving and maintaining a healthier weight. Carrie Buck is interested in becoming our patient and working on intensive lifestyle modifications including (but not limited to) diet and exercise for weight loss.  Carrie Buck is a returning patient - was last seen in February 2020. She is status post lap band surgery in 2020. She doesn't tolerate bread/rice. She is an Airline pilot and works through lunch. For breakfast she has toast or 2 eggs; Snack is an apple; Lunch is stir fry with noodles, yogurt parfait, or leftover fajitas; Snack is seedy nut crackers or fruit; Dinner is 1/2 breast of grilled chicken and 2 scoops of vegetables.  Carrie Buck's habits were reviewed today and are as follows: Her family eats meals together, she thinks her family will eat healthier with her, her desired weight loss is 87-107 lbs, she has been heavy most of her life, she started gaining weight ~[age] years old, her heaviest weight ever was 287 pounds, she craves savory food and bread, she snacks frequently in the evenings, she skips breakfast ~2 times weekly, she frequently makes poor food choices, she has binge eating behaviors and she struggles with emotional eating.  Depression Screen Carrie Buck's Food and Mood (modified PHQ-9) score was 11.  Depression screen Carrie Buck 2/9 06/18/2019  Decreased Interest 3  Down, Depressed, Hopeless 1  PHQ - 2 Score 4  Altered sleeping 1  Tired, decreased energy 1  Change in appetite 3  Feeling bad or failure about yourself  2  Trouble  concentrating 0  Moving slowly or fidgety/restless 0  Suicidal thoughts 0  PHQ-9 Score 11  Difficult doing work/chores Not difficult at all   Subjective:   Other fatigue. Carrie Buck denies daytime somnolence and admits to waking up still tired. Carrie Buck generally gets 6 hours of sleep per night, and states that she does not sleep well most nights. Snoring is present. Apneic episodes are not present. Epworth Sleepiness Score is 5.  SOB (shortness of breath) on exertion. [name] increasing shortness of breath with exercising and seems to be worsening over time with weight gain. She notes getting out of breath sooner with activity than she used to. This has not gotten worse recently. Carrie Buck denies shortness of breath at rest or orthopnea.  Prediabetes. Carrie Buck has a diagnosis of prediabetes based on her elevated HgA1c and was informed this puts her at greater risk of developing diabetes. She continues to work on diet and exercise to decrease her risk of diabetes. She denies nausea or hypoglycemia. Carrie Buck has a historical diagnosis; last A1c 5.2 on 02/20/2018. Last fasting lipid panel showed total cholesterol 168, triglycerides 183, HDL 63, and LDL 74.  Lab Results  Component Value Date   HGBA1C 5.2 02/20/2018   Lab Results  Component Value Date   INSULIN 15.3 02/20/2018   Vitamin D deficiency. Last Vitamin D 29.5. Carrie Buck is not taking Vitamin D currently.  Transaminitis. Last liver function tests showed slight elevation of ALT. CT scan in 2019 within normal limits hepatic exam.  Depression screening. Carrie Buck has a moderately positive depression screen with  a PHQ-9 score of 11.  At risk for diabetes mellitus. Carrie Buck is at higher than average risk for developing diabetes due to her obesity.   Assessment/Plan:   Other fatigue. Carrie Buck does feel that her weight is causing her energy to be lower than it should be. Fatigue may be related to obesity, depression or many other causes. Labs  will be ordered, and in the meanwhile, Carrie Buck will focus on self care including making healthy food choices, increasing physical activity and focusing on stress reduction.  EKG 12-Lead, CBC with Differential/Platelet, Vitamin B12, Folate, T3, T4 testing ordered today.  SOB (shortness of breath) on exertion. Carrie Buck does not feel that she gets out of breath more easily that she used to when she exercises. Carrie Buck's shortness of breath appears to be obesity related and exercise induced. She has agreed to work on weight loss and gradually increase exercise to treat her exercise induced shortness of breath. Will continue to monitor closely. CBC with Differential/Platelet, Vitamin B12, Folate, T3, T4 labs ordered today.  Prediabetes. Carrie Buck will continue to work on weight loss, exercise, and decreasing simple carbohydrates to help decrease the risk of diabetes. Insulin, random ordered today.  Vitamin D deficiency. Low Vitamin D level contributes to fatigue and are associated with obesity, breast, and colon cancer. She will follow-up for routine testing of Vitamin Din 3 months.  Transaminitis. Comprehensive metabolic panel ordered today.  Depression screening. Carrie Buck had a positive depression screening. Depression is commonly associated with obesity and often results in emotional eating behaviors. We will monitor this closely and work on CBT to help improve the non-hunger eating patterns. Referral to Psychology may be required if no improvement is seen as she continues in our clinic.  At risk for diabetes mellitus. Carrie Buck was given approximately 15 minutes of diabetes education and counseling today. We discussed intensive lifestyle modifications today with an emphasis on weight loss as well as increasing exercise and decreasing simple carbohydrates in her diet. We also reviewed medication options with an emphasis on risk versus benefit of those discussed.   Repetitive spaced learning was employed today  to elicit superior memory formation and behavioral change.  Class 3 severe obesity with serious comorbidity and body mass index (BMI) of 45.0 to 49.9 in adult, unspecified obesity type (HCC).  Sabrinia is currently in the action stage of change and her goal is to continue with weight loss efforts. I recommend Bekka begin the structured treatment plan as follows:  She has agreed to the [employer] + 300 calories.  Exercise goals: For substantial health benefits, adults should do at least 150 minutes (2 hours and 30 minutes) a week of moderate-intensity, or 75 minutes (1 hour and 15 minutes) a week of vigorous-intensity aerobic physical activity, or an equivalent combination of moderate- and vigorous-intensity aerobic activity. Aerobic activity should be performed in episodes of at least 10 minutes, and preferably, it should be spread throughout the week.   Behavioral modification strategies: increasing lean protein intake, increasing vegetables and meal planning and cooking strategies.  She was informed of the importance of frequent follow-up visits to maximize her success with intensive lifestyle modifications for her multiple health conditions. She was informed we would discuss her lab results at her next visit unless there is a critical issue that needs to be addressed sooner. Andres agreed to keep her next visit at the agreed upon time to discuss these results.  Objective:   Blood pressure 136/86, pulse 73, temperature 98 F (36.7 C), temperature source  Oral, height 5\' 6"  (1.676 m), weight 287 lb (130.2 kg), last menstrual period 06/05/2019, SpO2 95 %. Body mass index is 46.32 kg/m.  EKG: Sinus  Rhythm with a rate of 69 BPM. Low voltage in precordial leads. RSR(V1) - nondiagnostic. Abnormal.    Indirect Calorimeter completed today shows a VO2 of 297 and a REE of 2060.  Her calculated basal metabolic rate is 2542 thus her basal metabolic rate is worse than expected.  General:  Cooperative, alert, well developed, in no acute distress. HEENT: Conjunctivae and lids unremarkable. Cardiovascular: Regular rhythm.  Lungs: Normal work of breathing. Neurologic: No focal deficits.   Lab Results  Component Value Date   CREATININE 0.91 02/20/2018   BUN 9 02/20/2018   NA 140 02/20/2018   K 4.3 02/20/2018   CL 103 02/20/2018   CO2 18 (L) 02/20/2018   Lab Results  Component Value Date   ALT 33 (H) 02/20/2018   AST 21 02/20/2018   ALKPHOS 61 02/20/2018   BILITOT 0.5 02/20/2018   Lab Results  Component Value Date   HGBA1C 5.2 02/20/2018   Lab Results  Component Value Date   INSULIN 15.3 02/20/2018   Lab Results  Component Value Date   TSH 2.680 02/20/2018   Lab Results  Component Value Date   CHOL 182 02/20/2018   HDL 57 02/20/2018   LDLCALC 88 02/20/2018   TRIG 184 (H) 02/20/2018   Lab Results  Component Value Date   WBC 7.6 02/20/2018   HGB 13.6 02/20/2018   HCT 40.5 02/20/2018   MCV 88 02/20/2018   PLT 269 05/09/2017   No results found for: IRON, TIBC, FERRITIN  Attestation Statements:   This is the patient's first visit at [facility]. The patient's NEW PATIENT PACKET was reviewed at length. Included in the packet: current and past health history, medications, allergies, ROS, gynecologic history (women only), surgical history, family history, social history, weight history, weight loss surgery history (for those that have had weight loss surgery), nutritional evaluation, mood and food questionnaire, PHQ9, Epworth questionnaire, sleep habits questionnaire, patient life and health improvement goals questionnaire. These will all be scanned into the patient's chart under media.   During the visit, I independently reviewed the patient's EKG, bioimpedance scale results, and indirect calorimeter results. I used this information to tailor a meal plan for the patient that will help her to lose weight and will improve her obesity-related  conditions going forward. I performed a medically necessary appropriate examination and/or evaluation. I discussed the assessment and treatment plan with the patient. The patient was provided an opportunity to ask questions and all were answered. The patient agreed with the plan and demonstrated an understanding of the instructions. Labs were ordered at this visit and will be reviewed at the next visit unless more critical results need to be addressed immediately. Clinical information was updated and documented in the EMR.   Time spent on visit including pre-visit chart review and post-visit care was 45 minutes.   A separate 15 minutes was spent on risk counseling (see above).   I, Michaelene Song, am acting as transcriptionist for Coralie Common, MD   I have reviewed the above documentation for accuracy and completeness, and I agree with the above. - Jinny Blossom, MD

## 2019-06-19 LAB — CBC WITH DIFFERENTIAL/PLATELET
Basophils Absolute: 0 10*3/uL (ref 0.0–0.2)
Basos: 1 %
EOS (ABSOLUTE): 0.2 10*3/uL (ref 0.0–0.4)
Eos: 3 %
Hematocrit: 41.9 % (ref 34.0–46.6)
Hemoglobin: 14.1 g/dL (ref 11.1–15.9)
Immature Grans (Abs): 0 10*3/uL (ref 0.0–0.1)
Immature Granulocytes: 0 %
Lymphocytes Absolute: 2.3 10*3/uL (ref 0.7–3.1)
Lymphs: 30 %
MCH: 30 pg (ref 26.6–33.0)
MCHC: 33.7 g/dL (ref 31.5–35.7)
MCV: 89 fL (ref 79–97)
Monocytes Absolute: 0.5 10*3/uL (ref 0.1–0.9)
Monocytes: 6 %
Neutrophils Absolute: 4.6 10*3/uL (ref 1.4–7.0)
Neutrophils: 60 %
Platelets: 252 10*3/uL (ref 150–450)
RBC: 4.7 x10E6/uL (ref 3.77–5.28)
RDW: 12.4 % (ref 11.7–15.4)
WBC: 7.6 10*3/uL (ref 3.4–10.8)

## 2019-06-19 LAB — COMPREHENSIVE METABOLIC PANEL
ALT: 13 IU/L (ref 0–32)
AST: 12 IU/L (ref 0–40)
Albumin/Globulin Ratio: 1.4 (ref 1.2–2.2)
Albumin: 4.4 g/dL (ref 3.8–4.8)
Alkaline Phosphatase: 60 IU/L (ref 39–117)
BUN/Creatinine Ratio: 13 (ref 9–23)
BUN: 10 mg/dL (ref 6–20)
Bilirubin Total: 0.3 mg/dL (ref 0.0–1.2)
CO2: 22 mmol/L (ref 20–29)
Calcium: 9.6 mg/dL (ref 8.7–10.2)
Chloride: 103 mmol/L (ref 96–106)
Creatinine, Ser: 0.78 mg/dL (ref 0.57–1.00)
GFR calc Af Amer: 112 mL/min/{1.73_m2} (ref >59)
GFR calc non Af Amer: 97 mL/min/{1.73_m2} (ref >59)
Globulin, Total: 3.1 g/dL (ref 1.5–4.5)
Glucose: 89 mg/dL (ref 65–99)
Potassium: 4.2 mmol/L (ref 3.5–5.2)
Sodium: 138 mmol/L (ref 134–144)
Total Protein: 7.5 g/dL (ref 6.0–8.5)

## 2019-06-19 LAB — T3: T3, Total: 162 ng/dL (ref 71–180)

## 2019-06-19 LAB — FOLATE: Folate: 7 ng/mL (ref >3.0)

## 2019-06-19 LAB — INSULIN, RANDOM: INSULIN: 20.8 u[IU]/mL (ref 2.6–24.9)

## 2019-06-19 LAB — VITAMIN B12: Vitamin B-12: 421 pg/mL (ref 232–1245)

## 2019-06-19 LAB — T4: T4, Total: 8.1 ug/dL (ref 4.5–12.0)

## 2019-07-02 ENCOUNTER — Ambulatory Visit (INDEPENDENT_AMBULATORY_CARE_PROVIDER_SITE_OTHER): Payer: 59 | Admitting: Family Medicine

## 2019-07-02 ENCOUNTER — Encounter (INDEPENDENT_AMBULATORY_CARE_PROVIDER_SITE_OTHER): Payer: Self-pay | Admitting: Family Medicine

## 2019-07-02 ENCOUNTER — Other Ambulatory Visit: Payer: Self-pay

## 2019-07-02 VITALS — BP 128/72 | HR 82 | Temp 97.9°F | Ht 66.0 in | Wt 284.0 lb

## 2019-07-02 DIAGNOSIS — E559 Vitamin D deficiency, unspecified: Secondary | ICD-10-CM | POA: Diagnosis not present

## 2019-07-02 DIAGNOSIS — Z9189 Other specified personal risk factors, not elsewhere classified: Secondary | ICD-10-CM | POA: Diagnosis not present

## 2019-07-02 DIAGNOSIS — R7303 Prediabetes: Secondary | ICD-10-CM

## 2019-07-02 DIAGNOSIS — Z6841 Body Mass Index (BMI) 40.0 and over, adult: Secondary | ICD-10-CM

## 2019-07-02 MED ORDER — METFORMIN HCL 500 MG PO TABS: 500.0000 mg | ORAL_TABLET | Freq: Every day | ORAL | 0 refills | Status: DC

## 2019-07-03 NOTE — Progress Notes (Signed)
Chief Complaint:   OBESITY Carrie Buck is here to discuss her progress with her obesity treatment plan along with follow-up of her obesity related diagnoses. Carrie Buck is on the [place] +300 calories and states she is following her eating plan approximately 90% of the time. Carrie Buck states she is exercising for 0 minutes 0 times per week.  Today's visit was #: 2 Starting weight: 287 lbs Starting date: 06/18/2019 Today's weight: 284 lbs Today's date: 07/02/2019 Total lbs lost to date: 3 lbs Total lbs lost since last in-office visit: 3 lbs  Interim History:  Carrie Buck says she is really enjoying the meal plan (as is her husband).  She endorses occasional hunger in the afternoon, but it is not consistent.  She says it still takes her quite a bit of time to eat.  Her daughters will eat pasta for dinner.  She is doing wine in the evening or other alcohol in the evening.  She is going to [place] in mid June for 2 weeks and says she is concerned about sabotage.   Subjective:   1. Prediabetes Carrie Buck has a diagnosis of prediabetes based on her elevated HgA1c and was informed this puts her at greater risk of developing diabetes. She continues to work on diet and exercise to decrease her risk of diabetes. She denies nausea or hypoglycemia.  Carrie Buck was on metformin 3.5 years ago to bring her A1c back to normal.  She did not have side effects at that time.  Lab Results  Component Value Date   HGBA1C 5.2 02/20/2018   Lab Results  Component Value Date   INSULIN 20.8 06/18/2019   INSULIN 15.3 02/20/2018   2. Vitamin D deficiency Carrie Buck's Vitamin D level was 31.2 on 02/20/2018. She is currently taking prescription vitamin D 50,000 IU each week. She denies nausea, vomiting or muscle weakness.  She endorses fatigue.  3. At risk for osteoporosis Carrie Buck is at higher risk of osteopenia and osteoporosis due to Vitamin D deficiency.   Assessment/Plan:   1. Prediabetes Carrie Buck will continue to  work on weight loss, exercise, and decreasing simple carbohydrates to help decrease the risk of diabetes.  Will restart metformin, as per below. - metFORMIN (GLUCOPHAGE) 500 MG tablet; Take 1 tablet (500 mg total) by mouth daily with breakfast.  Dispense: 30 tablet; Refill: 0  2. Vitamin D deficiency Low Vitamin D level contributes to fatigue and are associated with obesity, breast, and colon cancer. She agrees to continue to take prescription Vitamin D @50 ,000 IU every week and will follow-up for routine testing of Vitamin D, at least 2-3 times per year to avoid over-replacement.  3. At risk for osteoporosis Carrie Buck was given approximately 15 minutes of osteoporosis prevention counseling today. Carrie Buck is at risk for osteopenia and osteoporosis due to her Vitamin D deficiency. She was encouraged to take her Vitamin D and follow her higher calcium diet and increase strengthening exercise to help strengthen her bones and decrease her risk of osteopenia and osteoporosis.  Repetitive spaced learning was employed today to elicit superior memory formation and behavioral change.  4. Class 3 severe obesity with serious comorbidity and body mass index (BMI) of 45.0 to 49.9 in adult, unspecified obesity type (HCC) Carrie Buck is currently in the action stage of change. As such, her goal is to continue with weight loss efforts. She has agreed to the [employer] +300 calories.   Exercise goals: No exercise has been prescribed at this time.  Behavioral modification strategies: increasing lean  protein intake, increasing vegetables, meal planning and cooking strategies, keeping healthy foods in the home and planning for success.  Carrie Buck has agreed to follow-up with our clinic in 2 weeks. She was informed of the importance of frequent follow-up visits to maximize her success with intensive lifestyle modifications for her multiple health conditions.   Objective:   Blood pressure 128/72, pulse 82, temperature  97.9 F (36.6 C), temperature source Oral, height 5\' 6"  (1.676 m), weight 284 lb (128.8 kg), last menstrual period 07/02/2019, SpO2 97 %. Body mass index is 45.84 kg/m.  General: Cooperative, alert, well developed, in no acute distress. HEENT: Conjunctivae and lids unremarkable. Cardiovascular: Regular rhythm.  Lungs: Normal work of breathing. Neurologic: No focal deficits.   Lab Results  Component Value Date   CREATININE 0.78 06/18/2019   BUN 10 06/18/2019   NA 138 06/18/2019   K 4.2 06/18/2019   CL 103 06/18/2019   CO2 22 06/18/2019   Lab Results  Component Value Date   ALT 13 06/18/2019   AST 12 06/18/2019   ALKPHOS 60 06/18/2019   BILITOT 0.3 06/18/2019   Lab Results  Component Value Date   HGBA1C 5.2 02/20/2018   Lab Results  Component Value Date   INSULIN 20.8 06/18/2019   INSULIN 15.3 02/20/2018   Lab Results  Component Value Date   TSH 2.680 02/20/2018   Lab Results  Component Value Date   CHOL 182 02/20/2018   HDL 57 02/20/2018   LDLCALC 88 02/20/2018   TRIG 184 (H) 02/20/2018   Lab Results  Component Value Date   WBC 7.6 06/18/2019   HGB 14.1 06/18/2019   HCT 41.9 06/18/2019   MCV 89 06/18/2019   PLT 252 06/18/2019   Attestation Statements:   Reviewed by clinician on day of visit: allergies, medications, problem list, medical history, surgical history, family history, social history, and previous encounter notes.  I, 08/18/2019, CMA, am acting as transcriptionist for Insurance claims handler, MD.  I have reviewed the above documentation for accuracy and completeness, and I agree with the above. - Reuben Likes, MD

## 2019-07-14 ENCOUNTER — Encounter (INDEPENDENT_AMBULATORY_CARE_PROVIDER_SITE_OTHER): Payer: Self-pay | Admitting: Family Medicine

## 2019-07-14 ENCOUNTER — Ambulatory Visit (INDEPENDENT_AMBULATORY_CARE_PROVIDER_SITE_OTHER): Payer: 59 | Admitting: Family Medicine

## 2019-07-14 ENCOUNTER — Other Ambulatory Visit: Payer: Self-pay

## 2019-07-14 VITALS — BP 125/80 | HR 72 | Temp 98.0°F | Ht 66.0 in | Wt 284.0 lb

## 2019-07-14 DIAGNOSIS — E559 Vitamin D deficiency, unspecified: Secondary | ICD-10-CM

## 2019-07-14 DIAGNOSIS — Z6841 Body Mass Index (BMI) 40.0 and over, adult: Secondary | ICD-10-CM

## 2019-07-14 DIAGNOSIS — E8881 Metabolic syndrome: Secondary | ICD-10-CM | POA: Diagnosis not present

## 2019-07-14 DIAGNOSIS — Z9189 Other specified personal risk factors, not elsewhere classified: Secondary | ICD-10-CM | POA: Diagnosis not present

## 2019-07-14 DIAGNOSIS — E88819 Insulin resistance, unspecified: Secondary | ICD-10-CM

## 2019-07-14 MED ORDER — METFORMIN HCL 500 MG PO TABS: 500.0000 mg | ORAL_TABLET | Freq: Every day | ORAL | 0 refills | Status: DC

## 2019-07-15 NOTE — Progress Notes (Signed)
Chief Complaint:   OBESITY Carrie Buck is here to discuss her progress with her obesity treatment plan along with follow-up of her obesity related diagnoses. Carrie Buck is on the [employer] +300 calories and states she is following her eating plan approximately 50-60% of the time. Carrie Buck states she is exercising for 0 minutes 0 times per week.  Today's visit was #: 3 Starting weight: 287 lbs Starting date: 06/18/2019 Today's weight: 284 lbs Today's date: 07/14/2019 Total lbs lost to date: 3 lbs Total lbs lost since last in-office visit: 0  Interim History: Carrie Buck says that the last few weeks were difficult for dinner and she was often eating out.  She noted that she and her husband have refocused on the plan.  She will be flying out to [place] in 1 week and will come back in 2 weeks.  She says she feels slightly more hungry if she does the yogurt option for breakfast.  Subjective:   1. Insulin resistance Carrie Buck has a diagnosis of insulin resistance based on her elevated fasting insulin level >5. She continues to work on diet and exercise to decrease her risk of diabetes.  She is taking metformin 500 mg daily.  Lab Results  Component Value Date   INSULIN 20.8 06/18/2019   INSULIN 15.3 02/20/2018   Lab Results  Component Value Date   HGBA1C 5.2 02/20/2018   2. Vitamin D deficiency Carrie Buck's Vitamin D level was 31.2 on 02/20/2018. She is currently taking prescription vitamin D 50,000 IU each week. She denies nausea, vomiting or muscle weakness. She endorses fatigue.  3. At risk for diabetes mellitus Carrie Buck is at higher than average risk for developing diabetes due to her obesity.   Assessment/Plan:   1. Insulin resistance Carrie Buck will continue to work on weight loss, exercise, and decreasing simple carbohydrates to help decrease the risk of diabetes. Aide agreed to follow-up with [name] as directed to closely monitor her progress. - metFORMIN (GLUCOPHAGE) 500 MG tablet; Take 1  tablet (500 mg total) by mouth daily with breakfast.  Dispense: 30 tablet; Refill: 0  2. Vitamin D deficiency Low Vitamin D level contributes to fatigue and are associated with obesity, breast, and colon cancer. She agrees to continue to take prescription Vitamin D @50 ,000 IU every week and will follow-up for routine testing of Vitamin D, at least 2-3 times per year to avoid over-replacement.  3. At risk for diabetes mellitus Carrie Buck was given approximately 15 minutes of diabetes education and counseling today. We discussed intensive lifestyle modifications today with an emphasis on weight loss as well as increasing exercise and decreasing simple carbohydrates in her diet. We also reviewed medication options with an emphasis on risk versus benefit of those discussed.   Repetitive spaced learning was employed today to elicit superior memory formation and behavioral change.  4. Class 3 severe obesity with serious comorbidity and body mass index (BMI) of 45.0 to 49.9 in adult, unspecified obesity type (HCC) Carrie Buck is currently in the action stage of change. As such, her goal is to continue with weight loss efforts. She has agreed to the [name] +300 calories.   Exercise goals: All adults should avoid inactivity. Some physical activity is better than none, and adults who participate in any amount of physical activity gain some health benefits.  Behavioral modification strategies: increasing lean protein intake, increasing vegetables, meal planning and cooking strategies, keeping healthy foods in the home and planning for success.  Carrie Buck has agreed to follow-up with our  clinic in 3 weeks. She was informed of the importance of frequent follow-up visits to maximize her success with intensive lifestyle modifications for her multiple health conditions.   Objective:   Blood pressure 125/80, pulse 72, temperature 98 F (36.7 C), temperature source Oral, height 5\' 6"  (1.676 m), weight 284 lb  (128.8 kg), last menstrual period 07/02/2019, SpO2 97 %. Body mass index is 45.84 kg/m.  General: Cooperative, alert, well developed, in no acute distress. HEENT: Conjunctivae and lids unremarkable. Cardiovascular: Regular rhythm.  Lungs: Normal work of breathing. Neurologic: No focal deficits.   Lab Results  Component Value Date   CREATININE 0.78 06/18/2019   BUN 10 06/18/2019   NA 138 06/18/2019   K 4.2 06/18/2019   CL 103 06/18/2019   CO2 22 06/18/2019   Lab Results  Component Value Date   ALT 13 06/18/2019   AST 12 06/18/2019   ALKPHOS 60 06/18/2019   BILITOT 0.3 06/18/2019   Lab Results  Component Value Date   HGBA1C 5.2 02/20/2018   Lab Results  Component Value Date   INSULIN 20.8 06/18/2019   INSULIN 15.3 02/20/2018   Lab Results  Component Value Date   TSH 2.680 02/20/2018   Lab Results  Component Value Date   CHOL 182 02/20/2018   HDL 57 02/20/2018   LDLCALC 88 02/20/2018   TRIG 184 (H) 02/20/2018   Lab Results  Component Value Date   WBC 7.6 06/18/2019   HGB 14.1 06/18/2019   HCT 41.9 06/18/2019   MCV 89 06/18/2019   PLT 252 06/18/2019   Attestation Statements:   Reviewed by clinician on day of visit: allergies, medications, problem list, medical history, surgical history, family history, social history, and previous encounter notes.  I, Water quality scientist, CMA, am acting as transcriptionist for Coralie Common, MD.  I have reviewed the above documentation for accuracy and completeness, and I agree with the above. - Jinny Blossom, MD

## 2019-07-25 ENCOUNTER — Other Ambulatory Visit (INDEPENDENT_AMBULATORY_CARE_PROVIDER_SITE_OTHER): Payer: Self-pay | Admitting: Family Medicine

## 2019-07-25 DIAGNOSIS — E8881 Metabolic syndrome: Secondary | ICD-10-CM

## 2019-08-04 ENCOUNTER — Ambulatory Visit (INDEPENDENT_AMBULATORY_CARE_PROVIDER_SITE_OTHER): Payer: 59 | Admitting: Family Medicine

## 2019-08-04 ENCOUNTER — Encounter (INDEPENDENT_AMBULATORY_CARE_PROVIDER_SITE_OTHER): Payer: Self-pay | Admitting: Family Medicine

## 2019-08-04 ENCOUNTER — Other Ambulatory Visit: Payer: Self-pay

## 2019-08-04 VITALS — BP 127/83 | HR 108 | Temp 98.1°F | Ht 66.0 in | Wt 281.0 lb

## 2019-08-04 DIAGNOSIS — E559 Vitamin D deficiency, unspecified: Secondary | ICD-10-CM

## 2019-08-04 DIAGNOSIS — R7303 Prediabetes: Secondary | ICD-10-CM | POA: Diagnosis not present

## 2019-08-04 DIAGNOSIS — Z6841 Body Mass Index (BMI) 40.0 and over, adult: Secondary | ICD-10-CM | POA: Diagnosis not present

## 2019-08-05 NOTE — Progress Notes (Signed)
Chief Complaint:   OBESITY Carrie Buck is here to discuss her progress with her obesity treatment plan along with follow-up of her obesity related diagnoses. Carrie Buck is on the [employer] + 300 calories and states she is following her eating plan approximately 50% of the time. Carrie Buck states she is walking 1-2 miles 5 times per week.  Today's visit was #: 4 Starting weight: 287 lbs Starting date: 06/18/2019 Today's weight: 281 lbs Today's date: 08/04/2019 Total lbs lost to date: 6 Total lbs lost since last in-office visit: 3  Interim History: Unique voices she just came back from a 2 week trip to [place]. She ate mostly salad and fish and only occasionally red meat. She was able to do eggs for breakfast everyday. She is getting groceries today.  Subjective:   1. Pre-diabetes Carrie Buck last A1c was 5.2 and insulin 20. She is on metformin and denies GI side effects.  2. Vitamin D deficiency Carrie Buck denies nausea, vomiting, or muscle weakness, but notes fatigue. She is on prescription Vit D.  Assessment/Plan:   1. Pre-diabetes Carrie Buck will continue metformin, and will continue to work on weight loss, exercise, and decreasing simple carbohydrates to help decrease the risk of diabetes.   2. Vitamin D deficiency Low Vitamin D level contributes to fatigue and are associated with obesity, breast, and colon cancer. Carrie Buck agreed to continue taking prescription Vitamin D 50,000 IU every week, no refill needed. She will follow-up for routine testing of Vitamin D, at least 2-3 times per year to avoid over-replacement.  3. Class 3 severe obesity with serious comorbidity and body mass index (BMI) of 45.0 to 49.9 in adult, unspecified obesity type (HCC) Carrie Buck is currently in the action stage of change. As such, her goal is to continue with weight loss efforts. She has agreed to the [employer] + 300 calories.   Exercise goals: As is.  Behavioral modification strategies: increasing  lean protein intake, increasing vegetables, meal planning and cooking strategies, keeping healthy foods in the home and planning for success.  Carrie Buck has agreed to follow-up with our clinic in 2 weeks. She was informed of the importance of frequent follow-up visits to maximize her success with intensive lifestyle modifications for her multiple health conditions.   Objective:   Blood pressure 127/83, pulse (!) 108, temperature 98.1 F (36.7 C), temperature source Oral, height 5\' 6"  (1.676 m), weight 281 lb (127.5 kg), last menstrual period 08/01/2019, SpO2 99 %. Body mass index is 45.35 kg/m.  General: Cooperative, alert, well developed, in no acute distress. HEENT: Conjunctivae and lids unremarkable. Cardiovascular: Regular rhythm.  Lungs: Normal work of breathing. Neurologic: No focal deficits.   Lab Results  Component Value Date   CREATININE 0.78 06/18/2019   BUN 10 06/18/2019   NA 138 06/18/2019   K 4.2 06/18/2019   CL 103 06/18/2019   CO2 22 06/18/2019   Lab Results  Component Value Date   ALT 13 06/18/2019   AST 12 06/18/2019   ALKPHOS 60 06/18/2019   BILITOT 0.3 06/18/2019   Lab Results  Component Value Date   HGBA1C 5.2 02/20/2018   Lab Results  Component Value Date   INSULIN 20.8 06/18/2019   INSULIN 15.3 02/20/2018   Lab Results  Component Value Date   TSH 2.680 02/20/2018   Lab Results  Component Value Date   CHOL 182 02/20/2018   HDL 57 02/20/2018   LDLCALC 88 02/20/2018   TRIG 184 (H) 02/20/2018   Lab Results  Component Value Date   WBC 7.6 06/18/2019   HGB 14.1 06/18/2019   HCT 41.9 06/18/2019   MCV 89 06/18/2019   PLT 252 06/18/2019   No results found for: IRON, TIBC, FERRITIN  Attestation Statements:   Reviewed by clinician on day of visit: allergies, medications, problem list, medical history, surgical history, family history, social history, and previous encounter notes.  Time spent on visit including pre-visit chart review and  post-visit care and charting was 15 minutes.    I, Burt Knack, am acting as transcriptionist for Reuben Likes, MD. I have reviewed the above documentation for accuracy and completeness, and I agree with the above. - Katherina Mires, MD

## 2019-08-18 ENCOUNTER — Ambulatory Visit (INDEPENDENT_AMBULATORY_CARE_PROVIDER_SITE_OTHER): Payer: 59 | Admitting: Family Medicine

## 2019-08-19 ENCOUNTER — Ambulatory Visit (INDEPENDENT_AMBULATORY_CARE_PROVIDER_SITE_OTHER): Payer: 59 | Admitting: Family Medicine

## 2019-08-19 ENCOUNTER — Other Ambulatory Visit: Payer: Self-pay

## 2019-08-19 ENCOUNTER — Encounter (INDEPENDENT_AMBULATORY_CARE_PROVIDER_SITE_OTHER): Payer: Self-pay | Admitting: Family Medicine

## 2019-08-19 VITALS — BP 119/76 | HR 73 | Temp 98.0°F | Ht 66.0 in | Wt 281.0 lb

## 2019-08-19 DIAGNOSIS — E8881 Metabolic syndrome: Secondary | ICD-10-CM | POA: Diagnosis not present

## 2019-08-19 DIAGNOSIS — Z6841 Body Mass Index (BMI) 40.0 and over, adult: Secondary | ICD-10-CM | POA: Diagnosis not present

## 2019-08-19 DIAGNOSIS — E88819 Insulin resistance, unspecified: Secondary | ICD-10-CM

## 2019-08-19 DIAGNOSIS — E66813 Obesity, class 3: Secondary | ICD-10-CM

## 2019-08-19 DIAGNOSIS — E559 Vitamin D deficiency, unspecified: Secondary | ICD-10-CM | POA: Diagnosis not present

## 2019-08-25 NOTE — Progress Notes (Signed)
Chief Complaint:   OBESITY Liylah is here to discuss her progress with her obesity treatment plan along with follow-up of her obesity related diagnoses. Kyrah is on the [employer] + 300 calories and states she is following her eating plan approximately 80% of the time. Ortha states she is walking 1 mile 2 times per week and 3 miles hike 1 time per week.  Today's visit was #: 5 Starting weight: 287 lbs Starting date: 06/18/2019 Today's weight: 281 lbs Today's date: 08/19/2019 Total lbs lost to date: 6 Total lbs lost since last in-office visit: 0  Interim History: Heather had a good few weeks, and she started walking and increased her exercise. She is looking for a change on meal plan. She is going back to the office and she is looking for easier lunch options and to keep some carbohydrates in the plan.  Subjective:   1. Insulin resistance Reniya's last A1c was 5.2 and insulin 20.8. She is on metformin 500 mg daily.  2. Vitamin D deficiency Aleila denies nausea, vomiting, or muscle weakness, but she notes fatigue. She is on prescription Vit D.  Assessment/Plan:   1. Insulin resistance Shakemia will continue metformin, and will continue to work on weight loss, exercise, and decreasing simple carbohydrates to help decrease the risk of diabetes. Chestine agreed to follow-up with [name] as directed to closely monitor her progress.  2. Vitamin D deficiency Low Vitamin D level contributes to fatigue and are associated with obesity, breast, and colon cancer. Madeleine agreed to continue taking prescription Vitamin D 50,000 IU every week, no refill needed. She will follow-up for routine testing of Vitamin D, at least 2-3 times per year to avoid over-replacement.  3. Class 3 severe obesity with serious comorbidity and body mass index (BMI) of 45.0 to 49.9 in adult, unspecified obesity type (HCC) Madelina is currently in the action stage of change. As such, her goal is to continue with  weight loss efforts. She has agreed to the Category 3 Plan.   Exercise goals: As is.  Behavioral modification strategies: increasing lean protein intake, meal planning and cooking strategies, keeping healthy foods in the home and planning for success.  Dylann has agreed to follow-up with our clinic in 2 to 3 weeks. She was informed of the importance of frequent follow-up visits to maximize her success with intensive lifestyle modifications for her multiple health conditions.   Objective:   Blood pressure 119/76, pulse 73, temperature 98 F (36.7 C), temperature source Oral, height 5\' 6"  (1.676 m), weight 281 lb (127.5 kg), last menstrual period 08/01/2019, SpO2 99 %. Body mass index is 45.35 kg/m.  General: Cooperative, alert, well developed, in no acute distress. HEENT: Conjunctivae and lids unremarkable. Cardiovascular: Regular rhythm.  Lungs: Normal work of breathing. Neurologic: No focal deficits.   Lab Results  Component Value Date   CREATININE 0.78 06/18/2019   BUN 10 06/18/2019   NA 138 06/18/2019   K 4.2 06/18/2019   CL 103 06/18/2019   CO2 22 06/18/2019   Lab Results  Component Value Date   ALT 13 06/18/2019   AST 12 06/18/2019   ALKPHOS 60 06/18/2019   BILITOT 0.3 06/18/2019   Lab Results  Component Value Date   HGBA1C 5.2 02/20/2018   Lab Results  Component Value Date   INSULIN 20.8 06/18/2019   INSULIN 15.3 02/20/2018   Lab Results  Component Value Date   TSH 2.680 02/20/2018   Lab Results  Component Value Date  CHOL 182 02/20/2018   HDL 57 02/20/2018   LDLCALC 88 02/20/2018   TRIG 184 (H) 02/20/2018   Lab Results  Component Value Date   WBC 7.6 06/18/2019   HGB 14.1 06/18/2019   HCT 41.9 06/18/2019   MCV 89 06/18/2019   PLT 252 06/18/2019   No results found for: IRON, TIBC, FERRITIN  Attestation Statements:   Reviewed by clinician on day of visit: allergies, medications, problem list, medical history, surgical history, family  history, social history, and previous encounter notes.  Time spent on visit including pre-visit chart review and post-visit care and charting was 15 minutes.   I, Burt Knack, am acting as transcriptionist for Reuben Likes, MD.  I have reviewed the above documentation for accuracy and completeness, and I agree with the above. - Katherina Mires, MD

## 2019-09-08 IMAGING — CT CT ABD-PELV W/ CM
1 of 2 series · 15 of 32 positions shown, 19 images · IV contrast (OMNIPAQUE)
Comparison: None.

CLINICAL DATA: Right-sided abdominal pain history of ovarian cysts

EXAM:
CT ABDOMEN AND PELVIS WITH CONTRAST
TECHNIQUE: Multidetector CT imaging of the abdomen and pelvis was performed
using the standard protocol following bolus administration of
intravenous contrast.
CONTRAST:  100mL YNVZBM-0MM IOPAMIDOL (YNVZBM-0MM) INJECTION 61%

[Series 2: routine abdomen/pelvis with · axial · 0.77mm/px · z∈[+716,+1166]mm · 15 of 100 slices shown, 19 images]
[im 5/100  soft-tissue]
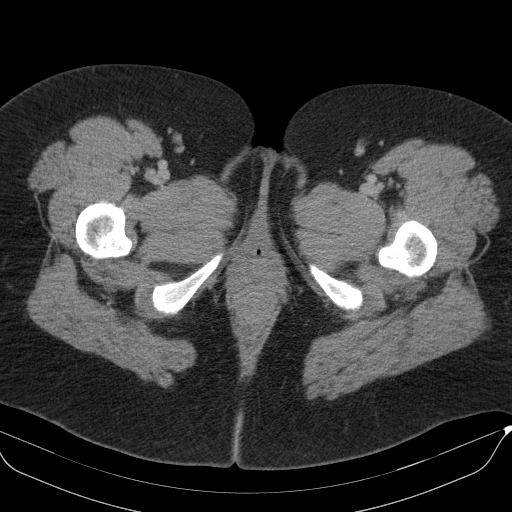
[im 5/100  bone]
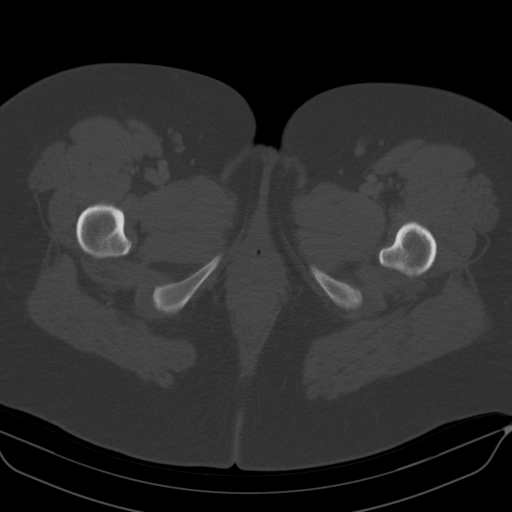
[im 13/100  soft-tissue]
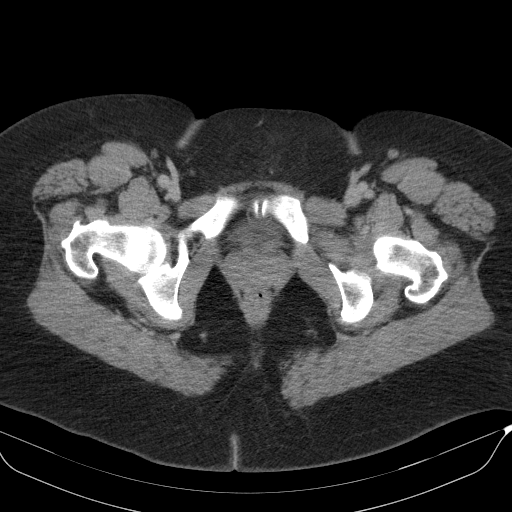
[im 22/100  soft-tissue]
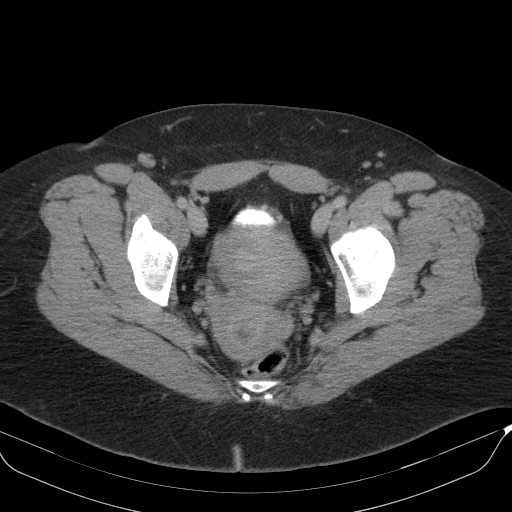
[im 26/100  soft-tissue]
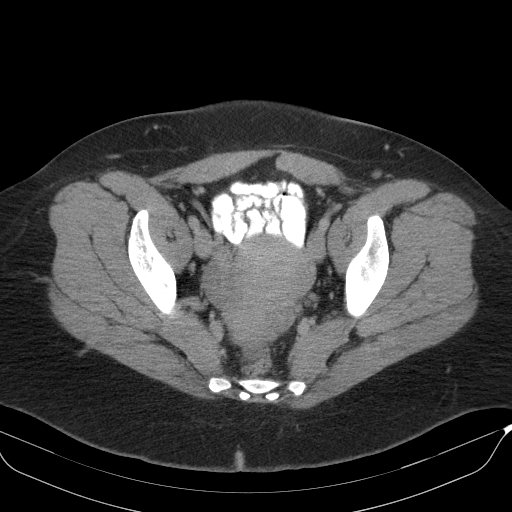
[im 35/100  soft-tissue]
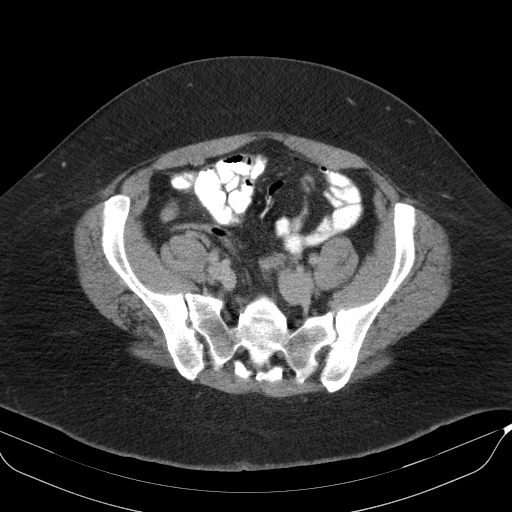
[im 44/100  soft-tissue]
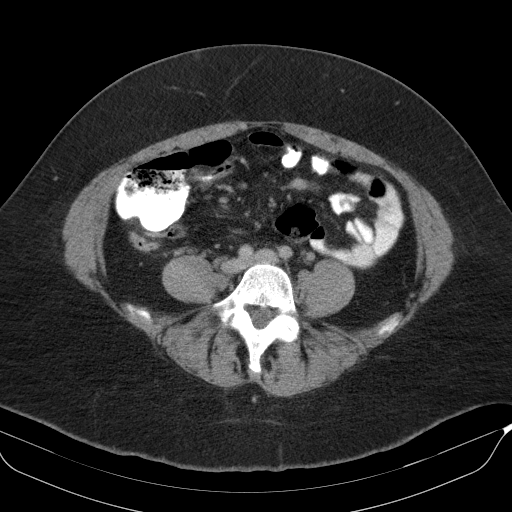
[im 52/100  soft-tissue]
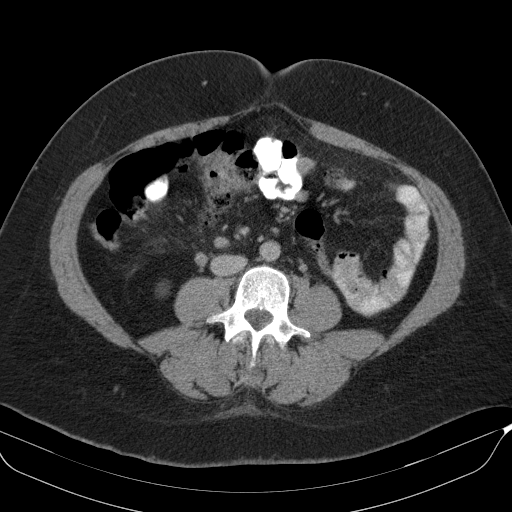
[im 56/100  soft-tissue]
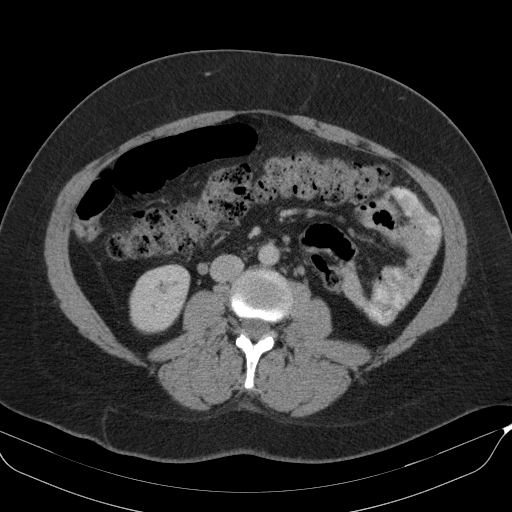
[im 65/100  soft-tissue]
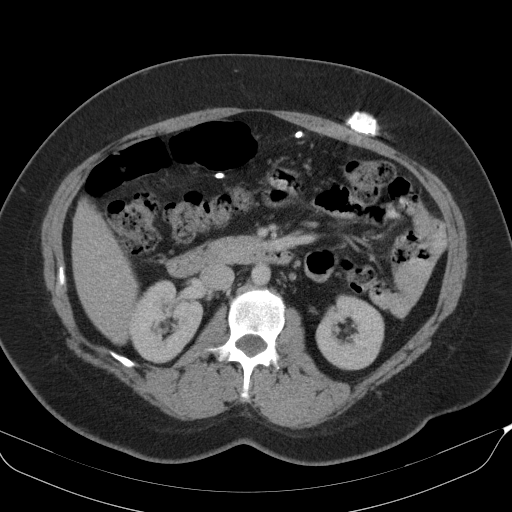
[im 65/100  bone]
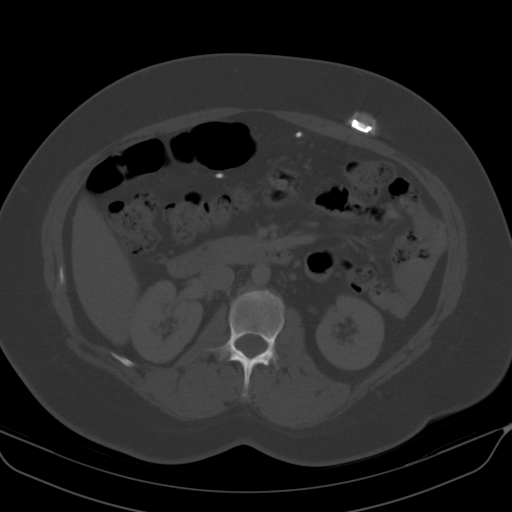
[im 74/100  soft-tissue]
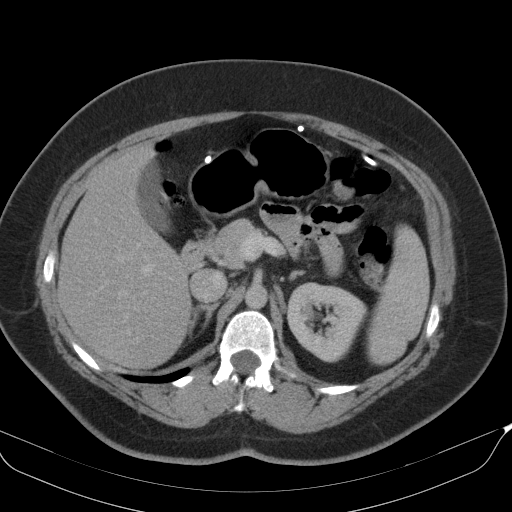
[im 78/100  soft-tissue]
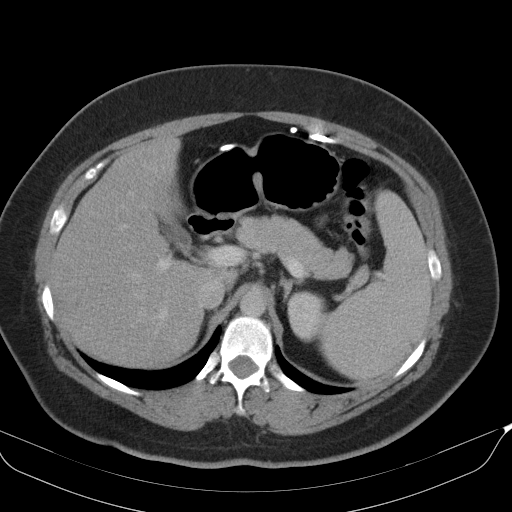
[im 82/100  lung]
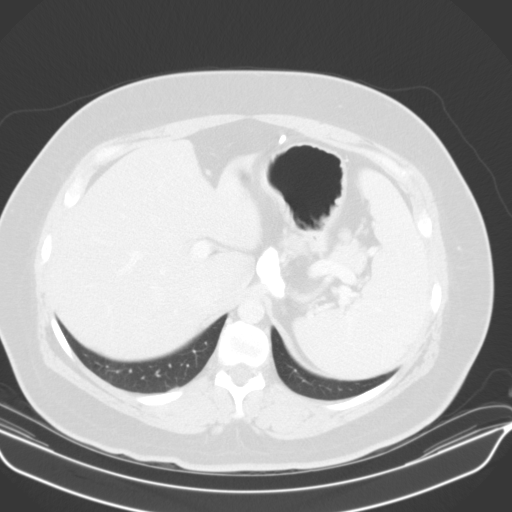
[im 87/100  soft-tissue]
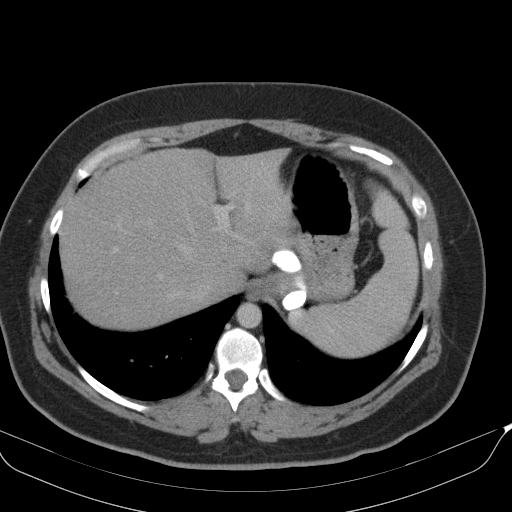
[im 87/100  lung]
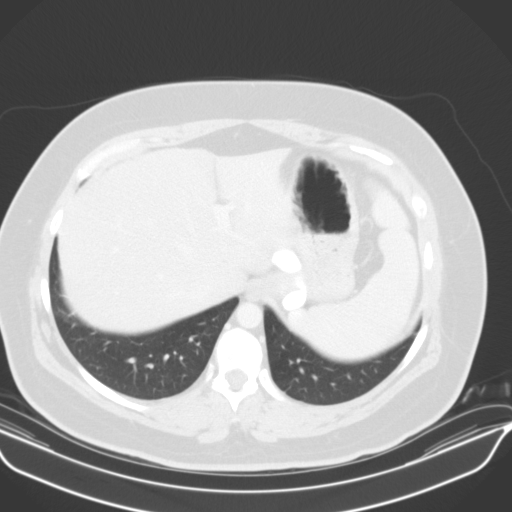
[im 91/100  lung]
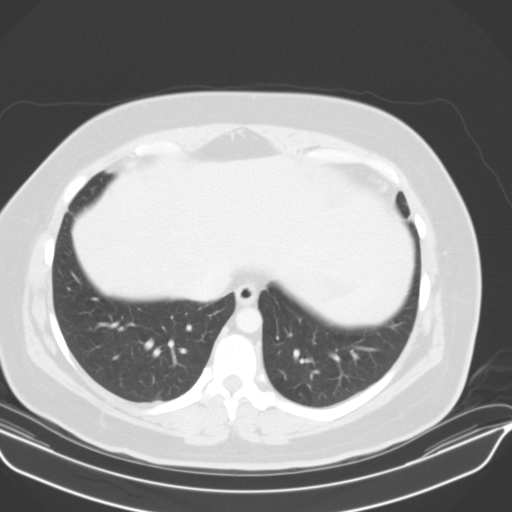
[im 95/100  soft-tissue]
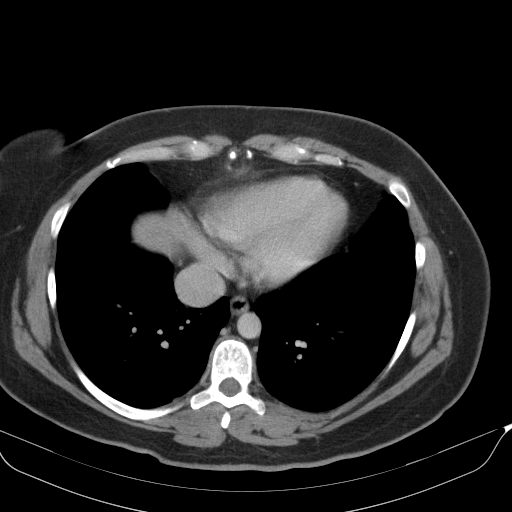
[im 95/100  lung]
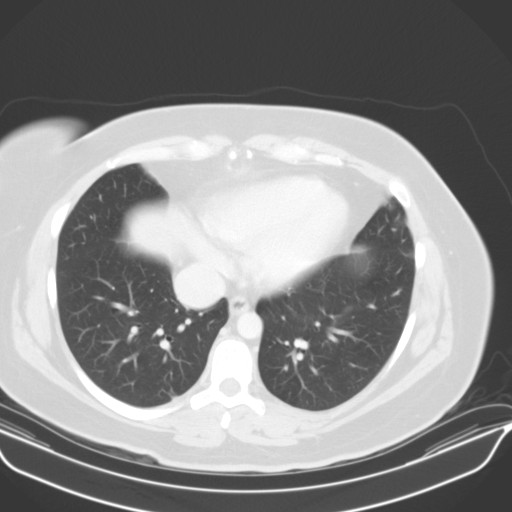

[15 of 32 positions shown; findings below may reference images not displayed]

FINDINGS: Lower chest: Lung bases demonstrate no acute consolidation or
effusion. Heart size upper normal.

Hepatobiliary: No focal hepatic abnormality. No calcified gallstones
or biliary dilatation

Pancreas: Unremarkable. No pancreatic ductal dilatation or
surrounding inflammatory changes.

Spleen: Enlarged, measuring 16 cm AP.  No focal abnormality

Adrenals/Urinary Tract: Adrenal glands are within normal limits. No
hydronephrosis. Punctate stone in the midpole of the left kidney.
Punctate stones in the mid and lower pole of the right kidney.
Bladder unremarkable

Stomach/Bowel: Status post gastric banding. No dilated small bowel.
No colon wall thickening.

Enlarged appendix, measuring up to 12 mm in diameter with mild
surrounding soft tissue stranding. No extraluminal gas.

Vascular/Lymphatic: No significant vascular findings are present. No
enlarged abdominal or pelvic lymph nodes.

Reproductive: Uterus and bilateral adnexa are unremarkable.

Other: Negative for free air or free fluid. Tiny calcifications or
surgical changes deep to the umbilicus.

Musculoskeletal: No acute or significant osseous findings.
IMPRESSION: 1. Findings consistent with acute appendicitis

Appendix: Location: Right lower quadrant

Diameter: 12 mm

Appendicolith: None

Mucosal hyper-enhancement: Not seen

Extraluminal gas: Not seen

Periappendiceal collection: Not seen

2.   Enlarged spleen

3.  Nonobstructing kidney stones

Critical Value/emergent results were called by telephone at the time
of interpretation on 03/25/2017 at [DATE] to Dr. Hans-Anton Thams,
who verbally acknowledged these results.

## 2019-09-09 ENCOUNTER — Other Ambulatory Visit: Payer: Self-pay

## 2019-09-09 ENCOUNTER — Ambulatory Visit (INDEPENDENT_AMBULATORY_CARE_PROVIDER_SITE_OTHER): Payer: 59 | Admitting: Family Medicine

## 2019-09-09 ENCOUNTER — Encounter (INDEPENDENT_AMBULATORY_CARE_PROVIDER_SITE_OTHER): Payer: Self-pay | Admitting: Family Medicine

## 2019-09-09 VITALS — BP 129/85 | HR 71 | Temp 98.4°F | Ht 66.0 in | Wt 281.0 lb

## 2019-09-09 DIAGNOSIS — Z6841 Body Mass Index (BMI) 40.0 and over, adult: Secondary | ICD-10-CM

## 2019-09-09 DIAGNOSIS — E8881 Metabolic syndrome: Secondary | ICD-10-CM

## 2019-09-09 DIAGNOSIS — Z9189 Other specified personal risk factors, not elsewhere classified: Secondary | ICD-10-CM | POA: Diagnosis not present

## 2019-09-09 DIAGNOSIS — E559 Vitamin D deficiency, unspecified: Secondary | ICD-10-CM

## 2019-09-09 MED ORDER — METFORMIN HCL 500 MG PO TABS: 500.0000 mg | ORAL_TABLET | Freq: Every day | ORAL | 0 refills | Status: DC

## 2019-09-09 NOTE — Progress Notes (Signed)
Chief Complaint:   OBESITY Carrie Buck is here to discuss her progress with her obesity treatment plan along with follow-up of her obesity related diagnoses. Carrie Buck is on the Category 3 Plan and states she is following her eating plan approximately 70-80% of the time. Carrie Buck states she is walking for 10-15 minutes 5 times per week.  Today's visit was #: 6 Starting weight: 287 lbs Starting date: 06/18/2019 Today's weight: 280 lbs Today's date: 09/09/2019 Total lbs lost to date: 7 Total lbs lost since last in-office visit: 1  Interim History: Carrie Buck had a toenail removed a week ago, so she has been walking less. Her husband is starting our program tomorrow. She did notice she is struggling with the quantity of meat at dinner. She had a indulgent meal yesterday. She feels she is making better choices food wise.  Subjective:   1. Insulin resistance Carrie Buck's last A1c was 5.2 and insulin 20.8. She is on metformin daily.  2. Vitamin D deficiency Carrie Buck denies nausea, vomiting, or muscle weakness, but she notes fatigue. She is on prescription Vit D.  3. At risk for diabetes mellitus Carrie Buck is at higher than average risk for developing diabetes due to her obesity.   Assessment/Plan:   1. Insulin resistance Carrie Buck will continue to work on weight loss, exercise, and decreasing simple carbohydrates to help decrease the risk of diabetes. We will refill metformin for 1 month. Carrie Buck agreed to follow-up with [name] as directed to closely monitor her progress.  - metFORMIN (GLUCOPHAGE) 500 MG tablet; Take 1 tablet (500 mg total) by mouth daily with breakfast.  Dispense: 30 tablet; Refill: 0  2. Vitamin D deficiency Low Vitamin D level contributes to fatigue and are associated with obesity, breast, and colon cancer. Carrie Buck agreed to continue taking prescription Vitamin D 50,000 IU every week, no refill needed. She will follow-up for routine testing of Vitamin D, at least 2-3 times per year to  avoid over-replacement.  3. At risk for diabetes mellitus Carrie Buck was given approximately 15 minutes of diabetes education and counseling today. We discussed intensive lifestyle modifications today with an emphasis on weight loss as well as increasing exercise and decreasing simple carbohydrates in her diet. We also reviewed medication options with an emphasis on risk versus benefit of those discussed.   Repetitive spaced learning was employed today to elicit superior memory formation and behavioral change.  4. Class 3 severe obesity with serious comorbidity and body mass index (BMI) of 45.0 to 49.9 in adult, unspecified obesity type (HCC) Carrie Buck is currently in the action stage of change. As such, her goal is to continue with weight loss efforts. She has agreed to the Category 3 Plan with protein equivalents.   Exercise goals: As is.  Behavioral modification strategies: increasing lean protein intake, increasing vegetables, meal planning and cooking strategies, keeping healthy foods in the home and planning for success.  Carrie Buck has agreed to follow-up with our clinic in 2 weeks. She was informed of the importance of frequent follow-up visits to maximize her success with intensive lifestyle modifications for her multiple health conditions.   Objective:   Blood pressure 129/85, pulse 71, temperature 98.4 F (36.9 C), temperature source Oral, height 5\' 6"  (1.676 m), weight 281 lb (127.5 kg), SpO2 98 %. Body mass index is 45.35 kg/m.  General: Cooperative, alert, well developed, in no acute distress. HEENT: Conjunctivae and lids unremarkable. Cardiovascular: Regular rhythm.  Lungs: Normal work of breathing. Neurologic: No focal deficits.   Lab Results  Component Value Date   CREATININE 0.78 06/18/2019   BUN 10 06/18/2019   NA 138 06/18/2019   K 4.2 06/18/2019   CL 103 06/18/2019   CO2 22 06/18/2019   Lab Results  Component Value Date   ALT 13 06/18/2019   AST 12 06/18/2019    ALKPHOS 60 06/18/2019   BILITOT 0.3 06/18/2019   Lab Results  Component Value Date   HGBA1C 5.2 02/20/2018   Lab Results  Component Value Date   INSULIN 20.8 06/18/2019   INSULIN 15.3 02/20/2018   Lab Results  Component Value Date   TSH 2.680 02/20/2018   Lab Results  Component Value Date   CHOL 182 02/20/2018   HDL 57 02/20/2018   LDLCALC 88 02/20/2018   TRIG 184 (H) 02/20/2018   Lab Results  Component Value Date   WBC 7.6 06/18/2019   HGB 14.1 06/18/2019   HCT 41.9 06/18/2019   MCV 89 06/18/2019   PLT 252 06/18/2019   No results found for: IRON, TIBC, FERRITIN  Attestation Statements:   Reviewed by clinician on day of visit: allergies, medications, problem list, medical history, surgical history, family history, social history, and previous encounter notes.   I, Burt Knack, am acting as transcriptionist for Reuben Likes, MD.  I have reviewed the above documentation for accuracy and completeness, and I agree with the above. - Katherina Mires, MD

## 2019-09-23 ENCOUNTER — Encounter (INDEPENDENT_AMBULATORY_CARE_PROVIDER_SITE_OTHER): Payer: Self-pay | Admitting: Family Medicine

## 2019-09-23 ENCOUNTER — Other Ambulatory Visit: Payer: Self-pay

## 2019-09-23 ENCOUNTER — Ambulatory Visit (INDEPENDENT_AMBULATORY_CARE_PROVIDER_SITE_OTHER): Payer: 59 | Admitting: Family Medicine

## 2019-09-23 VITALS — BP 122/80 | HR 86 | Temp 98.4°F | Ht 66.0 in | Wt 278.0 lb

## 2019-09-23 DIAGNOSIS — Z9189 Other specified personal risk factors, not elsewhere classified: Secondary | ICD-10-CM

## 2019-09-23 DIAGNOSIS — E8881 Metabolic syndrome: Secondary | ICD-10-CM | POA: Diagnosis not present

## 2019-09-23 DIAGNOSIS — Z6841 Body Mass Index (BMI) 40.0 and over, adult: Secondary | ICD-10-CM

## 2019-09-23 DIAGNOSIS — F3289 Other specified depressive episodes: Secondary | ICD-10-CM

## 2019-09-24 ENCOUNTER — Encounter (INDEPENDENT_AMBULATORY_CARE_PROVIDER_SITE_OTHER): Payer: Self-pay | Admitting: Family Medicine

## 2019-09-24 MED ORDER — METFORMIN HCL 500 MG PO TABS: 500.0000 mg | ORAL_TABLET | Freq: Every day | ORAL | 0 refills | Status: DC

## 2019-09-24 NOTE — Progress Notes (Signed)
Chief Complaint:   OBESITY Carrie Buck is here to discuss her progress with her obesity treatment plan along with follow-up of her obesity related diagnoses. Carrie Buck is on the Category 3 Plan and states she is following her eating plan approximately 50% of the time. Carrie Buck states she walked 1 mile 1 time for 1 week.   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 Her husband is now on the plan and this had made it easier for her to follow. She did have cake for her birthday, but otherwise on the plan when she is feeling better. She is still taking 1/2 dose of phentermine.  Subjective:   1. Insulin resistance Carrie Buck's last A1c was 5.2 and insulin 20.8. She denies GI side effects of metformin.  2. Other depression, with emotional eating Carrie Buck is on Prozac 20 mg daily, and she denies suicidal ideas or homicidal ideas. She notes her symptoms are better controlled.  3. At risk for diabetes mellitus Carrie Buck is at higher than average risk for developing diabetes due to her obesity.   Assessment/Plan:   1. Insulin resistance Carrie Buck will continue to work on weight loss, exercise, and decreasing simple carbohydrates to help decrease the risk of diabetes. Carrie Buck will continue metformin, no refill needed. Carrie Buck agreed to follow-up with [name] as directed to closely monitor her progress.  2. Other depression, with emotional eating Behavior modification techniques were discussed today to help Carrie Buck deal with her emotional/non-hunger eating behaviors. Carrie Buck will continue Prozac, no refill needed. Orders and follow up as documented in patient record.   3. At risk for  diabetes mellitus Carrie Buck was given approximately 15 minutes of diabetes education and counseling today. We discussed intensive lifestyle modifications today with an emphasis on weight loss as well as increasing exercise and decreasing simple carbohydrates in her diet. We also reviewed medication options with an emphasis on risk versus benefit of those discussed.   Repetitive spaced learning was employed today to elicit superior memory formation and behavioral change.  4. Class 3 severe obesity with serious comorbidity and body mass index (BMI) of 50.0 to 59.9 in adult, unspecified obesity type (HCC) Carrie Buck is currently in the action stage of change. As such, her goal is to continue with weight loss efforts. She has agreed to the Category 3 Plan.   Exercise goals: All adults should avoid inactivity. Some physical activity is better than none, and adults who participate in any amount of physical activity gain some health benefits.  Behavioral modification strategies: increasing lean protein intake, increasing vegetables, meal planning and cooking strategies, keeping healthy foods in the home and planning for success.  Carrie Buck has agreed to follow-up with our clinic in 2 weeks. She was informed of the importance of frequent follow-up visits to maximize her success with intensive lifestyle modifications for her multiple health conditions.   Objective:   Blood pressure 122/80, pulse 86, temperature 98.4 F (36.9 C), temperature source Oral, height 5\' 6"  (1.676 m), weight 278 lb (126.1 kg), last menstrual period 09/05/2019, SpO2 99 %. Body mass index is 44.87 kg/m.  General: Cooperative, alert, well developed, in no acute distress. HEENT: Conjunctivae and lids unremarkable. Cardiovascular:  Regular rhythm.  Lungs: Normal work of breathing. Neurologic: No focal deficits.   Lab Results  Component Value Date   CREATININE 0.78 06/18/2019   BUN 10 06/18/2019   NA 138 06/18/2019   K 4.2  06/18/2019   CL 103 06/18/2019   CO2 22 06/18/2019   Lab Results  Component Value Date   ALT 13 06/18/2019   AST 12 06/18/2019   ALKPHOS 60 06/18/2019   BILITOT 0.3 06/18/2019   Lab Results  Component Value Date   HGBA1C 5.2 02/20/2018   Lab Results  Component Value Date   INSULIN 20.8 06/18/2019   INSULIN 15.3 02/20/2018   Lab Results  Component Value Date   TSH 2.680 02/20/2018   Lab Results  Component Value Date   CHOL 182 02/20/2018   HDL 57 02/20/2018   LDLCALC 88 02/20/2018   TRIG 184 (H) 02/20/2018   Lab Results  Component Value Date   WBC 7.6 06/18/2019   HGB 14.1 06/18/2019   HCT 41.9 06/18/2019   MCV 89 06/18/2019   PLT 252 06/18/2019   No results found for: IRON, TIBC, FERRITIN  Attestation Statements:   Reviewed by clinician on day of visit: allergies, medications, problem list, medical history, surgical history, family history, social history, and previous encounter notes.  Time spent on visit including pre-visit chart review and post-visit care and charting was 16 minutes.    I, Burt Knack, am acting as transcriptionist for Reuben Likes, MD.  I have reviewed the above documentation for accuracy and completeness, and I agree with the above. - Katherina Mires, MD

## 2019-10-07 ENCOUNTER — Ambulatory Visit (INDEPENDENT_AMBULATORY_CARE_PROVIDER_SITE_OTHER): Payer: 59 | Admitting: Family Medicine

## 2019-10-07 ENCOUNTER — Other Ambulatory Visit: Payer: Self-pay

## 2019-10-07 ENCOUNTER — Encounter (INDEPENDENT_AMBULATORY_CARE_PROVIDER_SITE_OTHER): Payer: Self-pay | Admitting: Family Medicine

## 2019-10-07 VITALS — BP 118/80 | HR 94 | Temp 98.2°F | Ht 66.0 in | Wt 276.0 lb

## 2019-10-07 DIAGNOSIS — Z9189 Other specified personal risk factors, not elsewhere classified: Secondary | ICD-10-CM

## 2019-10-07 DIAGNOSIS — R7303 Prediabetes: Secondary | ICD-10-CM

## 2019-10-07 DIAGNOSIS — F418 Other specified anxiety disorders: Secondary | ICD-10-CM | POA: Diagnosis not present

## 2019-10-07 DIAGNOSIS — Z6841 Body Mass Index (BMI) 40.0 and over, adult: Secondary | ICD-10-CM

## 2019-10-07 MED ORDER — METFORMIN HCL 500 MG PO TABS: 500.0000 mg | ORAL_TABLET | Freq: Every day | ORAL | 0 refills | Status: DC

## 2019-10-08 NOTE — Progress Notes (Signed)
Chief Complaint:   OBESITY Carrie Buck is here to discuss her progress with her obesity treatment plan along with follow-up of her obesity related diagnoses. Carrie Buck is on the Category 3 Plan and states she is following her eating plan approximately 75-80% of the time. Carrie Buck states she is walking for 15-20 minutes 3 times per week.  Today's visit was #: 8 Starting weight: 287 lbs Starting date: 06/18/2019 Today's weight: 276 lbs Today's date: 10/07/2019 Total lbs lost to date: 11 Total lbs lost since last in-office visit: 2  Interim History: Carrie Buck has had a good 2 weeks. She went to the beach Thursday-Sunday, and she threw her back out while there. She did stick to the meal plan while away. She voices planning food and bringing food with her made it so much easier.  Subjective:   1. Pre-diabetes Carrie Buck's last A1c was 5.2 and insulin 15.3, and she is on metformin.  2. Anxiety with depression Carrie Buck has been on Prozac since March. She denies suicidal ideas or homicidal.   3. At risk for diabetes mellitus Carrie Buck is at higher than average risk for developing diabetes due to her obesity.   Assessment/Plan:   1. Pre-diabetes Carrie Buck will continue to work on weight loss, exercise, and decreasing simple carbohydrates to help decrease the risk of diabetes. We will refill metformin for 90 days with no refills.  - metFORMIN (GLUCOPHAGE) 500 MG tablet; Take 1 tablet (500 mg total) by mouth daily with breakfast.  Dispense: 90 tablet; Refill: 0  2. Anxiety with depression Behavior modification techniques were discussed today to help Carrie Buck deal with her anxiety and depression. Carrie Buck will continue Prozac, no refill needed. Orders and follow up as documented in patient record.   3. At risk for diabetes mellitus Carrie Buck was given approximately 15 minutes of diabetes education and counseling today. We discussed intensive lifestyle modifications today with an emphasis on weight loss as  well as increasing exercise and decreasing simple carbohydrates in her diet. We also reviewed medication options with an emphasis on risk versus benefit of those discussed.   Repetitive spaced learning was employed today to elicit superior memory formation and behavioral change.  4. Class 3 severe obesity with serious comorbidity and body mass index (BMI) of 40.0 to 44.9 in adult, unspecified obesity type (HCC) Carrie Buck is currently in the action stage of change. As such, her goal is to continue with weight loss efforts. She has agreed to the Category 3 Plan.   Exercise goals: As is.  We will check labs at her next appointment.  Behavioral modification strategies: increasing lean protein intake, increasing vegetables, meal planning and cooking strategies, keeping healthy foods in the home and planning for success.  Carrie Buck has agreed to follow-up with our clinic in 2 weeks. She was informed of the importance of frequent follow-up visits to maximize her success with intensive lifestyle modifications for her multiple health conditions.   Objective:   Blood pressure 118/80, pulse 94, temperature 98.2 F (36.8 C), temperature source Oral, height 5\' 6"  (1.676 m), weight 276 lb (125.2 kg), last menstrual period 09/26/2019, SpO2 98 %. Body mass index is 44.55 kg/m.  General: Cooperative, alert, well developed, in no acute distress. HEENT: Conjunctivae and lids unremarkable. Cardiovascular: Regular rhythm.  Lungs: Normal work of breathing. Neurologic: No focal deficits.   Lab Results  Component Value Date   CREATININE 0.78 06/18/2019   BUN 10 06/18/2019   NA 138 06/18/2019   K 4.2 06/18/2019   CL  103 06/18/2019   CO2 22 06/18/2019   Lab Results  Component Value Date   ALT 13 06/18/2019   AST 12 06/18/2019   ALKPHOS 60 06/18/2019   BILITOT 0.3 06/18/2019   Lab Results  Component Value Date   HGBA1C 5.2 02/20/2018   Lab Results  Component Value Date   INSULIN 20.8 06/18/2019    INSULIN 15.3 02/20/2018   Lab Results  Component Value Date   TSH 2.680 02/20/2018   Lab Results  Component Value Date   CHOL 182 02/20/2018   HDL 57 02/20/2018   LDLCALC 88 02/20/2018   TRIG 184 (H) 02/20/2018   Lab Results  Component Value Date   WBC 7.6 06/18/2019   HGB 14.1 06/18/2019   HCT 41.9 06/18/2019   MCV 89 06/18/2019   PLT 252 06/18/2019   No results found for: IRON, TIBC, FERRITIN  Attestation Statements:   Reviewed by clinician on day of visit: allergies, medications, problem list, medical history, surgical history, family history, social history, and previous encounter notes.   I, Burt Knack, am acting as transcriptionist for Reuben Likes, MD.  I have reviewed the above documentation for accuracy and completeness, and I agree with the above. - Katherina Mires, MD

## 2019-10-23 ENCOUNTER — Other Ambulatory Visit: Payer: Self-pay

## 2019-10-23 ENCOUNTER — Encounter (INDEPENDENT_AMBULATORY_CARE_PROVIDER_SITE_OTHER): Payer: Self-pay | Admitting: Family Medicine

## 2019-10-23 ENCOUNTER — Ambulatory Visit (INDEPENDENT_AMBULATORY_CARE_PROVIDER_SITE_OTHER): Payer: 59 | Admitting: Family Medicine

## 2019-10-23 VITALS — BP 120/75 | HR 75 | Temp 98.1°F | Ht 66.0 in | Wt 280.0 lb

## 2019-10-23 DIAGNOSIS — Z6841 Body Mass Index (BMI) 40.0 and over, adult: Secondary | ICD-10-CM

## 2019-10-23 DIAGNOSIS — Z9189 Other specified personal risk factors, not elsewhere classified: Secondary | ICD-10-CM

## 2019-10-23 DIAGNOSIS — E559 Vitamin D deficiency, unspecified: Secondary | ICD-10-CM

## 2019-10-23 DIAGNOSIS — R7303 Prediabetes: Secondary | ICD-10-CM | POA: Diagnosis not present

## 2019-10-23 MED ORDER — WEGOVY 0.25 MG/0.5ML ~~LOC~~ SOAJ: 0.2500 mg | SUBCUTANEOUS | 0 refills | Status: DC

## 2019-10-24 LAB — COMPREHENSIVE METABOLIC PANEL
ALT: 14 IU/L (ref 0–32)
AST: 10 IU/L (ref 0–40)
Albumin/Globulin Ratio: 1.6 (ref 1.2–2.2)
Albumin: 4.4 g/dL (ref 3.8–4.8)
Alkaline Phosphatase: 57 IU/L (ref 44–121)
BUN/Creatinine Ratio: 14 (ref 9–23)
BUN: 11 mg/dL (ref 6–20)
Bilirubin Total: 0.3 mg/dL (ref 0.0–1.2)
CO2: 22 mmol/L (ref 20–29)
Calcium: 9.3 mg/dL (ref 8.7–10.2)
Chloride: 102 mmol/L (ref 96–106)
Creatinine, Ser: 0.77 mg/dL (ref 0.57–1.00)
GFR calc Af Amer: 112 mL/min/{1.73_m2} (ref >59)
GFR calc non Af Amer: 98 mL/min/{1.73_m2} (ref >59)
Globulin, Total: 2.8 g/dL (ref 1.5–4.5)
Glucose: 88 mg/dL (ref 65–99)
Potassium: 4.3 mmol/L (ref 3.5–5.2)
Sodium: 138 mmol/L (ref 134–144)
Total Protein: 7.2 g/dL (ref 6.0–8.5)

## 2019-10-24 LAB — HEMOGLOBIN A1C
Est. average glucose Bld gHb Est-mCnc: 105 mg/dL
Hgb A1c MFr Bld: 5.3 % (ref 4.8–5.6)

## 2019-10-24 LAB — INSULIN, RANDOM: INSULIN: 16.9 u[IU]/mL (ref 2.6–24.9)

## 2019-10-24 LAB — VITAMIN B12: Vitamin B-12: 684 pg/mL (ref 232–1245)

## 2019-10-24 LAB — VITAMIN D 25 HYDROXY (VIT D DEFICIENCY, FRACTURES): Vit D, 25-Hydroxy: 48.4 ng/mL (ref 30.0–100.0)

## 2019-10-27 NOTE — Progress Notes (Signed)
Chief Complaint:   OBESITY Carrie Buck is here to discuss her progress with her obesity treatment plan along with follow-up of her obesity related diagnoses. Carrie Buck is on the Category 3 Plan and states she is following her eating plan approximately 70-80% of the time. Carrie Buck states she is walking for 30-35 minutes 1-3 times per week.  Today's visit was #: 9 Starting weight: 287 lbs Starting date: 06/18/2019 Today's weight: 280 lbs Today's date: 10/23/2019 Total lbs lost to date: 7 Total lbs lost since last in-office visit: 0  Interim History: Carrie Buck attended a few birthday celebrations with her kids and did attend a party last night. She is feeling more constipated. Meeting full protein at dinner. She is walking more, and she denies hunger. Her anniversary is next week, but she has no other plans. Her biggest obstacle is prepping lunch the night before. She is about to go off phentermine.  Subjective:   1. Pre-diabetes Carrie Buck's last A1c was 5.2 (in Epic). She is on metformin and denies GI side effects.  2. Vitamin D deficiency Carrie Buck denies nausea, vomiting, or muscle weakness, but notes fatigue. She is not on Vit D.  3. At risk for diabetes mellitus Carrie Buck is at higher than average risk for developing diabetes due to her obesity.   Assessment/Plan:   1. Pre-diabetes Carrie Buck will continue to work on weight loss, exercise, and decreasing simple carbohydrates to help decrease the risk of diabetes. We will check labs today, and Carrie Buck will continue metformin, no refill needed.  - Insulin, random - Hemoglobin A1c - Comprehensive metabolic panel - Vitamin B12  2. Vitamin D deficiency Low Vitamin D level contributes to fatigue and are associated with obesity, breast, and colon cancer. We will check labs today. Carrie Buck will follow-up for routine testing of Vitamin D, at least 2-3 times per year to avoid over-replacement.  - VITAMIN D 25 Hydroxy (Vit-D Deficiency,  Fractures)  3. At risk for diabetes mellitus Carrie Buck was given approximately 15 minutes of diabetes education and counseling today. We discussed intensive lifestyle modifications today with an emphasis on weight loss as well as increasing exercise and decreasing simple carbohydrates in her diet. We also reviewed medication options with an emphasis on risk versus benefit of those discussed.   Repetitive spaced learning was employed today to elicit superior memory formation and behavioral change.  4. Class 3 severe obesity with serious comorbidity and body mass index (BMI) of 45.0 to 49.9 in adult, unspecified obesity type (HCC) Carrie Buck is currently in the action stage of change. As such, her goal is to continue with weight loss efforts. She has agreed to the Category 3 Plan.   We discussed various medication options to help Carrie Buck with her weight loss efforts and we both agreed to start Wegovy 0.25 mg SubQ weekly with no refills.  - Semaglutide-Weight Management (WEGOVY) 0.25 MG/0.5ML SOAJ; Inject 0.25 mg into the skin once a week.  Dispense: 2 mL; Refill: 0  Exercise goals: All adults should avoid inactivity. Some physical activity is better than none, and adults who participate in any amount of physical activity gain some health benefits.  Behavioral modification strategies: increasing lean protein intake, meal planning and cooking strategies, keeping healthy foods in the home and planning for success.  Carrie Buck has agreed to follow-up with our clinic in 2 weeks. She was informed of the importance of frequent follow-up visits to maximize her success with intensive lifestyle modifications for her multiple health conditions.   Carrie Buck was informed  we would discuss her lab results at her next visit unless there is a critical issue that needs to be addressed sooner. Carrie Buck agreed to keep her next visit at the agreed upon time to discuss these results.  Objective:   Blood pressure 120/75, pulse  75, temperature 98.1 F (36.7 C), temperature source Oral, height 5\' 6"  (1.676 m), weight 280 lb (127 kg), last menstrual period 09/26/2019, SpO2 99 %. Body mass index is 45.19 kg/m.  General: Cooperative, alert, well developed, in no acute distress. HEENT: Conjunctivae and lids unremarkable. Cardiovascular: Regular rhythm.  Lungs: Normal work of breathing. Neurologic: No focal deficits.   Lab Results  Component Value Date   CREATININE 0.77 10/23/2019   BUN 11 10/23/2019   NA 138 10/23/2019   K 4.3 10/23/2019   CL 102 10/23/2019   CO2 22 10/23/2019   Lab Results  Component Value Date   ALT 14 10/23/2019   AST 10 10/23/2019   ALKPHOS 57 10/23/2019   BILITOT 0.3 10/23/2019   Lab Results  Component Value Date   HGBA1C 5.3 10/23/2019   HGBA1C 5.2 02/20/2018   Lab Results  Component Value Date   INSULIN 16.9 10/23/2019   INSULIN 20.8 06/18/2019   INSULIN 15.3 02/20/2018   Lab Results  Component Value Date   TSH 2.680 02/20/2018   Lab Results  Component Value Date   CHOL 182 02/20/2018   HDL 57 02/20/2018   LDLCALC 88 02/20/2018   TRIG 184 (H) 02/20/2018   Lab Results  Component Value Date   WBC 7.6 06/18/2019   HGB 14.1 06/18/2019   HCT 41.9 06/18/2019   MCV 89 06/18/2019   PLT 252 06/18/2019   No results found for: IRON, TIBC, FERRITIN  Attestation Statements:   Reviewed by clinician on day of visit: allergies, medications, problem list, medical history, surgical history, family history, social history, and previous encounter notes.   I, 08/18/2019, am acting as transcriptionist for Burt Knack, MD.  I have reviewed the above documentation for accuracy and completeness, and I agree with the above. - Reuben Likes, MD

## 2019-11-06 ENCOUNTER — Ambulatory Visit (INDEPENDENT_AMBULATORY_CARE_PROVIDER_SITE_OTHER): Payer: 59 | Admitting: Family Medicine

## 2019-11-06 ENCOUNTER — Encounter (INDEPENDENT_AMBULATORY_CARE_PROVIDER_SITE_OTHER): Payer: Self-pay

## 2019-11-11 ENCOUNTER — Other Ambulatory Visit: Payer: Self-pay

## 2019-11-11 ENCOUNTER — Encounter (INDEPENDENT_AMBULATORY_CARE_PROVIDER_SITE_OTHER): Payer: Self-pay | Admitting: Family Medicine

## 2019-11-11 ENCOUNTER — Ambulatory Visit (INDEPENDENT_AMBULATORY_CARE_PROVIDER_SITE_OTHER): Payer: 59 | Admitting: Family Medicine

## 2019-11-11 VITALS — BP 127/81 | HR 74 | Temp 98.0°F | Ht 66.0 in | Wt 272.0 lb

## 2019-11-11 DIAGNOSIS — Z6841 Body Mass Index (BMI) 40.0 and over, adult: Secondary | ICD-10-CM

## 2019-11-11 DIAGNOSIS — E559 Vitamin D deficiency, unspecified: Secondary | ICD-10-CM

## 2019-11-11 DIAGNOSIS — E8881 Metabolic syndrome: Secondary | ICD-10-CM

## 2019-11-12 DIAGNOSIS — E88819 Insulin resistance, unspecified: Secondary | ICD-10-CM | POA: Insufficient documentation

## 2019-11-12 DIAGNOSIS — E8881 Metabolic syndrome: Secondary | ICD-10-CM | POA: Insufficient documentation

## 2019-11-12 DIAGNOSIS — E559 Vitamin D deficiency, unspecified: Secondary | ICD-10-CM | POA: Insufficient documentation

## 2019-11-12 MED ORDER — WEGOVY 0.25 MG/0.5ML ~~LOC~~ SOAJ: 0.2500 mg | SUBCUTANEOUS | 0 refills | Status: DC

## 2019-11-12 NOTE — Progress Notes (Signed)
Chief Complaint:   OBESITY Carrie Buck is here to discuss her progress with her obesity treatment plan along with follow-up of her obesity related diagnoses. Carrie Buck is on the Category 3 Plan and states she is following her eating plan approximately 90% of the time. Carrie Buck states she is walking and hiking for 35 minutes 3 times per week.  Today's visit was #: 10 Starting weight: 287 lbs Starting date: 06/18/2019 Today's weight: 272 lbs Today's date: 11/11/2019 Total lbs lost to date: 15 Total lbs lost since last in-office visit: 8  Interim History: Carrie Buck is really enjoying Wegovy. Really decreased cravings for butter, oil, and bread. She denies nausea except if she goes too long without eating. She does report she hasn't replaced carbohydrate calories. Potential sabotage with holidays coming up but she is anticipating strategies.  Subjective:   1. Vitamin D deficiency Carrie Buck denies nausea, vomiting, or muscle weakness, but notes fatigue. She is not on prescription Vit D. Last Vit D level was 48.4.  2. Insulin resistance Carrie Buck's last insulin level was 16.9 and A1c 5.3. She denies carbohydrate cravings.  Assessment/Plan:   1. Vitamin D deficiency Low Vitamin D level contributes to fatigue and are associated with obesity, breast, and colon cancer. Carrie Buck agreed to continue taking OTC Vit D and will follow-up for routine testing of Vitamin D, at least 2-3 times per year to avoid over-replacement.  2. Insulin resistance Carrie Buck will continue to work on weight loss, exercise, and decreasing simple carbohydrates to help decrease the risk of diabetes. We will repeat labs in February 2022. Carrie Buck agreed to follow-up with [name] as directed to closely monitor her progress.  3. Class 3 severe obesity with serious comorbidity and body mass index (BMI) of 40.0 to 44.9 in adult, unspecified obesity type (HCC) Carrie Buck is currently in the action stage of change. As such, her goal is to continue  with weight loss efforts. She has agreed to the Category 3 Plan.   We discussed various medication options to help Carrie Buck with her weight loss efforts and we both agreed to continue Wegovy 0.25 mg SubQ weekly #2 mL, and we will refill for 1 month.  Exercise goals: All adults should avoid inactivity. Some physical activity is better than none, and adults who participate in any amount of physical activity gain some health benefits.  Behavioral modification strategies: increasing lean protein intake, meal planning and cooking strategies, keeping healthy foods in the home and planning for success.  Carrie Buck has agreed to follow-up with our clinic in 2 to 3 weeks. She was informed of the importance of frequent follow-up visits to maximize her success with intensive lifestyle modifications for her multiple health conditions.   Objective:   Blood pressure 127/81, pulse 74, temperature 98 F (36.7 C), height 5\' 6"  (1.676 m), weight 272 lb (123.4 kg), SpO2 99 %. Body mass index is 43.9 kg/m.  General: Cooperative, alert, well developed, in no acute distress. HEENT: Conjunctivae and lids unremarkable. Cardiovascular: Regular rhythm.  Lungs: Normal work of breathing. Neurologic: No focal deficits.   Lab Results  Component Value Date   CREATININE 0.77 10/23/2019   BUN 11 10/23/2019   NA 138 10/23/2019   K 4.3 10/23/2019   CL 102 10/23/2019   CO2 22 10/23/2019   Lab Results  Component Value Date   ALT 14 10/23/2019   AST 10 10/23/2019   ALKPHOS 57 10/23/2019   BILITOT 0.3 10/23/2019   Lab Results  Component Value Date   HGBA1C  5.3 10/23/2019   HGBA1C 5.2 02/20/2018   Lab Results  Component Value Date   INSULIN 16.9 10/23/2019   INSULIN 20.8 06/18/2019   INSULIN 15.3 02/20/2018   Lab Results  Component Value Date   TSH 2.680 02/20/2018   Lab Results  Component Value Date   CHOL 182 02/20/2018   HDL 57 02/20/2018   LDLCALC 88 02/20/2018   TRIG 184 (H) 02/20/2018   Lab  Results  Component Value Date   WBC 7.6 06/18/2019   HGB 14.1 06/18/2019   HCT 41.9 06/18/2019   MCV 89 06/18/2019   PLT 252 06/18/2019   No results found for: IRON, TIBC, FERRITIN  Attestation Statements:   Reviewed by clinician on day of visit: allergies, medications, problem list, medical history, surgical history, family history, social history, and previous encounter notes.  Time spent on visit including pre-visit chart review and post-visit care and charting was 18 minutes.    I, Burt Knack, am acting as transcriptionist for Carrie Likes, MD.  I have reviewed the above documentation for accuracy and completeness, and I agree with the above. - Carrie Likes, MD

## 2019-12-03 ENCOUNTER — Encounter (INDEPENDENT_AMBULATORY_CARE_PROVIDER_SITE_OTHER): Payer: Self-pay | Admitting: Family Medicine

## 2019-12-03 ENCOUNTER — Other Ambulatory Visit: Payer: Self-pay

## 2019-12-03 ENCOUNTER — Ambulatory Visit (INDEPENDENT_AMBULATORY_CARE_PROVIDER_SITE_OTHER): Payer: 59 | Admitting: Family Medicine

## 2019-12-03 VITALS — BP 119/80 | HR 90 | Temp 98.6°F | Ht 66.0 in | Wt 266.0 lb

## 2019-12-03 DIAGNOSIS — K5909 Other constipation: Secondary | ICD-10-CM

## 2019-12-03 DIAGNOSIS — E8881 Metabolic syndrome: Secondary | ICD-10-CM

## 2019-12-03 DIAGNOSIS — Z6841 Body Mass Index (BMI) 40.0 and over, adult: Secondary | ICD-10-CM | POA: Diagnosis not present

## 2019-12-04 NOTE — Progress Notes (Signed)
Chief Complaint:   OBESITY Carrie Buck is here to discuss her progress with her obesity treatment plan along with follow-up of her obesity related diagnoses. Carrie Buck is on the Category 3 Plan and states she is following her eating plan approximately 90% of the time. [name] states she is walking 35 minutes 3 times per week and kayaking or activity 35 minutes 1 time per week.  Today's visit was #: 14 Starting weight: 260 lbs Starting date: 02/10/2018 Today's weight: 266 lbs Today's date: 12/03/2019 Total lbs lost to date: 0 Total lbs lost since last in-office visit: 6  Interim History: Over the last few weeks Carrie Buck has been sticking to the meal plan almost completely. She denies hunger, but there are times she is missing meals secondary to work being busy. Some days she cannot eat everything on the plan. She reports eating pretzels, cheese, fruit, brie, yogurt and parfait for snacks.  Subjective:   Other constipation. Carrie Buck is taking psyllium and water. She reports consistency of bowel movements is unchanged.  Insulin resistance. Demetric has a diagnosis of insulin resistance based on her elevated fasting insulin level >5. She continues to work on diet and exercise to decrease her risk of diabetes. Lizann is on metformin and denies GI side effects. Last A1c 5.3, insulin 16.9.  Lab Results  Component Value Date   INSULIN 16.9 10/23/2019   INSULIN 20.8 06/18/2019   INSULIN 15.3 02/20/2018   Lab Results  Component Value Date   HGBA1C 5.3 10/23/2019   Assessment/Plan:   Other constipation. Carrie Buck was informed that a decrease in bowel movement frequency is normal while losing weight, but stools should not be hard or painful. Orders and follow up as documented in patient record. She will continue current psyllium and water.  Counseling Getting to Good Bowel Health: Your goal is to have one soft bowel movement each day. Drink at least 8 glasses of water each day. Eat  plenty of fiber (goal is over 25 grams each day). It is best to get most of your fiber from dietary sources which includes leafy green vegetables, fresh fruit, and whole grains. You may need to add fiber with the help of OTC fiber supplements. These include Metamucil, Citrucel, and Flaxseed. If you are still having trouble, try adding Miralax or Magnesium Citrate. If all of these changes do not work, Dietitian.  Insulin resistance. Carrie Buck will continue to work on weight loss, exercise, and decreasing simple carbohydrates to help decrease the risk of diabetes. Carrie Buck agreed to follow-up with [name] as directed to closely monitor her progress. Labs will be checked in January 2022.  Class 3 severe obesity with serious comorbidity and body mass index (BMI) of 40.0 to 44.9 in adult, unspecified obesity type (HCC). Carrie Buck will continue [facility] with no change in dose.  Carrie Buck is currently in the action stage of change. As such, her goal is to continue with weight loss efforts. She has agreed to the Category 3 Plan.   Exercise goals: Carrie Buck will continue her current exercise regimen.   Behavioral modification strategies: increasing lean protein intake, meal planning and cooking strategies, keeping healthy foods in the home and planning for success.  Carrie Buck has agreed to follow-up with our clinic in 2 weeks. She was informed of the importance of frequent follow-up visits to maximize her success with intensive lifestyle modifications for her multiple health conditions.   Objective:   Blood pressure 119/80, pulse 90, temperature 98.6 F (37 C), temperature source  Oral, height 5\' 6"  (1.676 m), weight 266 lb (120.7 kg), last menstrual period 11/18/2019, SpO2 98 %. Body mass index is 42.93 kg/m.  General: Cooperative, alert, well developed, in no acute distress. HEENT: Conjunctivae and lids unremarkable. Cardiovascular: Regular rhythm.  Lungs: Normal work of breathing. Neurologic: No focal  deficits.   Lab Results  Component Value Date   CREATININE 0.77 10/23/2019   BUN 11 10/23/2019   NA 138 10/23/2019   K 4.3 10/23/2019   CL 102 10/23/2019   CO2 22 10/23/2019   Lab Results  Component Value Date   ALT 14 10/23/2019   AST 10 10/23/2019   ALKPHOS 57 10/23/2019   BILITOT 0.3 10/23/2019   Lab Results  Component Value Date   HGBA1C 5.3 10/23/2019   HGBA1C 5.2 02/20/2018   Lab Results  Component Value Date   INSULIN 16.9 10/23/2019   INSULIN 20.8 06/18/2019   INSULIN 15.3 02/20/2018   Lab Results  Component Value Date   TSH 2.680 02/20/2018   Lab Results  Component Value Date   CHOL 182 02/20/2018   HDL 57 02/20/2018   LDLCALC 88 02/20/2018   TRIG 184 (H) 02/20/2018   Lab Results  Component Value Date   WBC 7.6 06/18/2019   HGB 14.1 06/18/2019   HCT 41.9 06/18/2019   MCV 89 06/18/2019   PLT 252 06/18/2019   No results found for: IRON, TIBC, FERRITIN  Attestation Statements:   Reviewed by clinician on day of visit: allergies, medications, problem list, medical history, surgical history, family history, social history, and previous encounter notes.  Time spent on visit including pre-visit chart review and post-visit charting and care was 16 minutes.   I, 08/18/2019, am acting as transcriptionist for [name], MD   I have reviewed the above documentation for accuracy and completeness, and I agree with the above. - Reuben Likes, MD

## 2019-12-18 ENCOUNTER — Other Ambulatory Visit: Payer: Self-pay

## 2019-12-18 ENCOUNTER — Encounter (INDEPENDENT_AMBULATORY_CARE_PROVIDER_SITE_OTHER): Payer: Self-pay | Admitting: Family Medicine

## 2019-12-18 ENCOUNTER — Ambulatory Visit (INDEPENDENT_AMBULATORY_CARE_PROVIDER_SITE_OTHER): Payer: 59 | Admitting: Family Medicine

## 2019-12-18 VITALS — BP 138/84 | HR 88 | Temp 98.1°F | Ht 66.0 in | Wt 265.0 lb

## 2019-12-18 DIAGNOSIS — E559 Vitamin D deficiency, unspecified: Secondary | ICD-10-CM | POA: Diagnosis not present

## 2019-12-18 DIAGNOSIS — E8881 Metabolic syndrome: Secondary | ICD-10-CM | POA: Diagnosis not present

## 2019-12-18 DIAGNOSIS — Z9189 Other specified personal risk factors, not elsewhere classified: Secondary | ICD-10-CM | POA: Diagnosis not present

## 2019-12-18 DIAGNOSIS — Z6841 Body Mass Index (BMI) 40.0 and over, adult: Secondary | ICD-10-CM | POA: Diagnosis not present

## 2019-12-18 MED ORDER — WEGOVY 0.5 MG/0.5ML ~~LOC~~ SOAJ: 0.5000 mg | SUBCUTANEOUS | 0 refills | Status: DC

## 2019-12-18 MED ORDER — METFORMIN HCL 500 MG PO TABS: 500.0000 mg | ORAL_TABLET | Freq: Every day | ORAL | 0 refills | Status: DC

## 2019-12-22 NOTE — Progress Notes (Signed)
Chief Complaint:   OBESITY Carrie Buck is here to discuss her progress with her obesity treatment plan along with follow-up of her obesity related diagnoses. Carrie Buck is on the Category 3 Plan and states she is following her eating plan approximately 70% of the time. Carrie Buck states she is walking 2 miles for 45 minutes 1 time per week.  Today's visit was #: 15 Starting weight: 260 lbs Starting date: 02/10/2018 Today's weight: 265 lbs Today's date: 12/18/2019 Total lbs lost to date: 0 Total lbs lost since last in-office visit: 1  Interim History: Carrie Buck had a busy couple of weeks, kid wise and work wise. She is better at being able to get food in terms of hunger but she has had to deviate off the plan secondary to time and work demands. The next few weeks are busy for her.  Subjective:   1. Insulin resistance Carrie Buck's last A1c was 5.3 and insulin 16.9. She denies GI side effects of metformin.  2. Vitamin D deficiency Carrie Buck denies nausea, vomiting, or muscle weakness, but she notes fatigue. She is not on Vit D.  3. At risk for diabetes mellitus Carrie Buck is at higher than average risk for developing diabetes due to obesity.   Assessment/Plan:   1. Insulin resistance Carrie Buck will continue to work on weight loss, exercise, and decreasing simple carbohydrates to help decrease the risk of diabetes. We will refill metformin for 1 month. Carrie Buck agreed to follow-up with [name] as directed to closely monitor her progress.  - metFORMIN (GLUCOPHAGE) 500 MG tablet; Take 1 tablet (500 mg total) by mouth daily with breakfast.  Dispense: 90 tablet; Refill: 0  2. Vitamin D deficiency Low Vitamin D level contributes to fatigue and are associated with obesity, breast, and colon cancer. We will repeat Vit D level in February 2022. Carrie Buck will follow-up for routine testing of Vitamin D, at least 2-3 times per year to avoid over-replacement.  3. At risk for diabetes mellitus Carrie Buck was given  approximately 15 minutes of diabetes education and counseling today. We discussed intensive lifestyle modifications today with an emphasis on weight loss as well as increasing exercise and decreasing simple carbohydrates in her diet. We also reviewed medication options with an emphasis on risk versus benefit of those discussed.   Repetitive spaced learning was employed today to elicit superior memory formation and behavioral change.  4. Class 3 severe obesity with serious comorbidity and body mass index (BMI) of 40.0 to 44.9 in adult, unspecified obesity type (HCC) Carrie Buck is currently in the action stage of change. As such, her goal is to continue with weight loss efforts. She has agreed to the Category 3 Plan.   We discussed various medication options to help Carrie Buck with her weight loss efforts and we both agreed to increase Wegovy to 0.50 mg SubQ weekly with no refills.  - Semaglutide-Weight Management (WEGOVY) 0.5 MG/0.5ML SOAJ; Inject 0.5 mg into the skin once a week.  Dispense: 2 mL; Refill: 0  Exercise goals: As is.  Behavioral modification strategies: increasing lean protein intake, meal planning and cooking strategies, keeping healthy foods in the home, travel eating strategies and holiday eating strategies .  Carrie Buck has agreed to follow-up with our clinic in 4 weeks. She was informed of the importance of frequent follow-up visits to maximize her success with intensive lifestyle modifications for her multiple health conditions.   Objective:   Blood pressure 138/84, pulse 88, temperature 98.1 F (36.7 C), temperature source Oral, height 5\' 6"  (1.676  m), weight 265 lb (120.2 kg), last menstrual period 12/16/2019, SpO2 98 %. Body mass index is 42.77 kg/m.  General: Cooperative, alert, well developed, in no acute distress. HEENT: Conjunctivae and lids unremarkable. Cardiovascular: Regular rhythm.  Lungs: Normal work of breathing. Neurologic: No focal deficits.   Lab Results    Component Value Date   CREATININE 0.77 10/23/2019   BUN 11 10/23/2019   NA 138 10/23/2019   K 4.3 10/23/2019   CL 102 10/23/2019   CO2 22 10/23/2019   Lab Results  Component Value Date   ALT 14 10/23/2019   AST 10 10/23/2019   ALKPHOS 57 10/23/2019   BILITOT 0.3 10/23/2019   Lab Results  Component Value Date   HGBA1C 5.3 10/23/2019   HGBA1C 5.2 02/20/2018   Lab Results  Component Value Date   INSULIN 16.9 10/23/2019   INSULIN 20.8 06/18/2019   INSULIN 15.3 02/20/2018   Lab Results  Component Value Date   TSH 2.680 02/20/2018   Lab Results  Component Value Date   CHOL 182 02/20/2018   HDL 57 02/20/2018   LDLCALC 88 02/20/2018   TRIG 184 (H) 02/20/2018   Lab Results  Component Value Date   WBC 7.6 06/18/2019   HGB 14.1 06/18/2019   HCT 41.9 06/18/2019   MCV 89 06/18/2019   PLT 252 06/18/2019   No results found for: IRON, TIBC, FERRITIN  Attestation Statements:   Reviewed by clinician on day of visit: allergies, medications, problem list, medical history, surgical history, family history, social history, and previous encounter notes.   I, Burt Knack, am acting as transcriptionist for Reuben Likes, MD. I have reviewed the above documentation for accuracy and completeness, and I agree with the above. - Katherina Mires, MD

## 2020-01-15 ENCOUNTER — Encounter (INDEPENDENT_AMBULATORY_CARE_PROVIDER_SITE_OTHER): Payer: Self-pay | Admitting: Family Medicine

## 2020-01-15 ENCOUNTER — Other Ambulatory Visit: Payer: Self-pay

## 2020-01-15 ENCOUNTER — Ambulatory Visit (INDEPENDENT_AMBULATORY_CARE_PROVIDER_SITE_OTHER): Payer: 59 | Admitting: Family Medicine

## 2020-01-15 VITALS — BP 137/75 | HR 84 | Temp 97.4°F | Ht 66.0 in | Wt 256.0 lb

## 2020-01-15 DIAGNOSIS — R7303 Prediabetes: Secondary | ICD-10-CM | POA: Diagnosis not present

## 2020-01-15 DIAGNOSIS — F418 Other specified anxiety disorders: Secondary | ICD-10-CM | POA: Diagnosis not present

## 2020-01-15 DIAGNOSIS — Z6841 Body Mass Index (BMI) 40.0 and over, adult: Secondary | ICD-10-CM

## 2020-01-15 DIAGNOSIS — Z9189 Other specified personal risk factors, not elsewhere classified: Secondary | ICD-10-CM | POA: Diagnosis not present

## 2020-01-15 MED ORDER — WEGOVY 0.5 MG/0.5ML ~~LOC~~ SOAJ: 0.5000 mg | SUBCUTANEOUS | 0 refills | Status: DC

## 2020-01-19 NOTE — Progress Notes (Signed)
Chief Complaint:   OBESITY Carrie Buck is here to discuss her progress with her obesity treatment plan along with follow-up of her obesity related diagnoses. Carrie Buck is on the Category 3 Plan and states she is following her eating plan approximately 85-90% of the time. Carrie Buck states she is walking 2 miles 3 times a week and hiking 1 mile once a week.  Today's visit was #: 16 Starting weight: 260 lbs Starting date: 02/10/2018 Today's weight: 256 lbs Today's date: 01/15/2020 Total lbs lost to date: 4 lbs Total lbs lost since last in-office visit: 9 lbs  Interim History: Carrie Buck had a Thanksgiving with friends and was able to control cravings. She has had a very stressful last few weeks at work. Her husband has been very supportive of her during this stressful time.  Subjective:   1. Pre-diabetes Udell's last A1c was 5.3 and Insulin 16.9. She is on Metformin and denies GI side effects.  2. Anxiety with depression Carrie Buck is on Prozac 20 mg. She denies suicidal and homicidal ideations. She doesn't feel she needs to increase Prozac.  Assessment/Plan:   1. Pre-diabetes Continue Metformin. Carrie Buck denies need for refill at this time. Carrie Buck will continue to work on weight loss, exercise, and decreasing simple carbohydrates to help decrease the risk of diabetes.   2. Anxiety with depression Continue Prozac. No change in dose, as patient feels she is okay at this time. Behavior modification techniques were discussed today to help Carrie Buck deal with her anxiety. Orders and follow up as documented in patient record.   3. At risk for diabetes mellitus Carrie Buck was given approximately 15 minutes of diabetes education and counseling today. We discussed intensive lifestyle modifications today with an emphasis on weight loss as well as increasing exercise and decreasing simple carbohydrates in her diet. We also reviewed medication options with an emphasis on risk versus benefit of those  discussed.   Repetitive spaced learning was employed today to elicit superior memory formation and behavioral change.  4. Class 3 severe obesity with serious comorbidity and body mass index (BMI) of 40.0 to 44.9 in adult, unspecified obesity type (HCC)  Carrie Buck is currently in the action stage of change. As such, her goal is to continue with weight loss efforts. She has agreed to the Category 3 Plan.   We discussed various medication options to help Carrie Buck with her weight loss efforts and we both agreed to continue [place], and we will refill for 1 month.  - Semaglutide-Weight Management (WEGOVY) 0.5 MG/0.5ML SOAJ; Inject 0.5 mg into the skin once a week.  Dispense: 2 mL; Refill: 0  Exercise goals: As is  Behavioral modification strategies: increasing lean protein intake, meal planning and cooking strategies, keeping healthy foods in the home, dealing with family or coworker sabotage, holiday eating strategies  and planning for success.  Carrie Buck has agreed to follow-up with our clinic in 4 weeks. She was informed of the importance of frequent follow-up visits to maximize her success with intensive lifestyle modifications for her multiple health conditions.   Objective:   Blood pressure 137/75, pulse 84, temperature (!) 97.4 F (36.3 C), temperature source Oral, height 5\' 6"  (1.676 m), weight 256 lb (116.1 kg), last menstrual period 12/15/2019, SpO2 100 %. Body mass index is 41.32 kg/m.  General: Cooperative, alert, well developed, in no acute distress. HEENT: Conjunctivae and lids unremarkable. Cardiovascular: Regular rhythm.  Lungs: Normal work of breathing. Neurologic: No focal deficits.   Lab Results  Component Value Date  CREATININE 0.77 10/23/2019   BUN 11 10/23/2019   NA 138 10/23/2019   K 4.3 10/23/2019   CL 102 10/23/2019   CO2 22 10/23/2019   Lab Results  Component Value Date   ALT 14 10/23/2019   AST 10 10/23/2019   ALKPHOS 57 10/23/2019   BILITOT 0.3  10/23/2019   Lab Results  Component Value Date   HGBA1C 5.3 10/23/2019   HGBA1C 5.2 02/20/2018   Lab Results  Component Value Date   INSULIN 16.9 10/23/2019   INSULIN 20.8 06/18/2019   INSULIN 15.3 02/20/2018   Lab Results  Component Value Date   TSH 2.680 02/20/2018   Lab Results  Component Value Date   CHOL 182 02/20/2018   HDL 57 02/20/2018   LDLCALC 88 02/20/2018   TRIG 184 (H) 02/20/2018   Lab Results  Component Value Date   WBC 7.6 06/18/2019   HGB 14.1 06/18/2019   HCT 41.9 06/18/2019   MCV 89 06/18/2019   PLT 252 06/18/2019    Attestation Statements:   Reviewed by clinician on day of visit: allergies, medications, problem list, medical history, surgical history, family history, social history, and previous encounter notes.  Edmund Hilda, am acting as transcriptionist for Reuben Likes, MD.  I have reviewed the above documentation for accuracy and completeness, and I agree with the above. - Katherina Mires, MD

## 2020-02-12 ENCOUNTER — Encounter (INDEPENDENT_AMBULATORY_CARE_PROVIDER_SITE_OTHER): Payer: Self-pay | Admitting: Family Medicine

## 2020-02-12 ENCOUNTER — Other Ambulatory Visit: Payer: Self-pay

## 2020-02-12 ENCOUNTER — Ambulatory Visit (INDEPENDENT_AMBULATORY_CARE_PROVIDER_SITE_OTHER): Payer: 59 | Admitting: Family Medicine

## 2020-02-12 VITALS — BP 123/87 | HR 84 | Temp 98.3°F | Ht 66.0 in | Wt 258.0 lb

## 2020-02-12 DIAGNOSIS — Z9189 Other specified personal risk factors, not elsewhere classified: Secondary | ICD-10-CM | POA: Diagnosis not present

## 2020-02-12 DIAGNOSIS — E8881 Metabolic syndrome: Secondary | ICD-10-CM | POA: Diagnosis not present

## 2020-02-12 DIAGNOSIS — E559 Vitamin D deficiency, unspecified: Secondary | ICD-10-CM | POA: Diagnosis not present

## 2020-02-12 DIAGNOSIS — Z6841 Body Mass Index (BMI) 40.0 and over, adult: Secondary | ICD-10-CM

## 2020-02-12 MED ORDER — WEGOVY 1 MG/0.5ML ~~LOC~~ SOAJ: 1.0000 mg | SUBCUTANEOUS | 0 refills | Status: DC

## 2020-02-12 MED ORDER — VITAMIN D3 250 MCG (10000 UT) PO TABS: 10000.0000 [IU] | ORAL_TABLET | ORAL | Status: DC

## 2020-02-12 NOTE — Progress Notes (Signed)
Chief Complaint:   OBESITY Carrie Buck is here to discuss her progress with her obesity treatment plan along with follow-up of her obesity related diagnoses. Carrie Buck is on the Category 3 Plan and states she is following her eating plan approximately 30% of the time. Floyce states she rode 2 miles 2-3 times per week.  Today's visit was #: 17 Starting weight: 260 lbs Starting date: 02/10/2018 Today's weight: 258 lbs Today's date: 02/12/2020 Total lbs lost to date: 2 lbs Total lbs lost since last in-office visit: 0  Interim History: Carrie Buck was ill last week day of and days after covid booster. She spent days prior to Christmas with her husband's family. She ate indulgently over holiday and found herself grazing throughout the day. Carrie Buck got back on track this past weekend.  Subjective:   1. Metabolic syndrome Sakari has elevated triglycerides, elevated BS, and elevated waist circumference. She is on [place] currently.    2. Vitamin D deficiency Carrie Buck's Vitamin D level was 48.4 on 10/23/2019. She is currently taking OTC vitamin D once each day. She denies nausea, vomiting or muscle weakness, and she notes fatigue.  3. At risk for osteoporosis Carrie Buck is at higher risk of osteopenia and osteoporosis due to Vitamin D deficiency.     Assessment/Plan:   1. Metabolic syndrome [name] will continue on Wegovy.  2. Vitamin D deficiency We will repeat labs in 2 months. [name] will continue over the counter Vit D.  3. At risk for osteoporosis Joelene was given approximately 15 minutes of osteoporosis prevention counseling today. Carrie Buck is at risk for osteopenia and osteoporosis due to her Vitamin D deficiency. She was encouraged to take her Vitamin D and follow her higher calcium diet and increase strengthening exercise to help strengthen her bones and decrease her risk of osteopenia and osteoporosis.  Repetitive spaced learning was employed today to elicit superior memory formation  and behavioral change.  4. Class 3 severe obesity with serious comorbidity and body mass index (BMI) of 40.0 to 44.9 in adult, unspecified obesity type (HCC)  Carrie Buck is currently in the action stage of change. As such, her goal is to continue with weight loss efforts. She has agreed to the Category 3 Plan.    - Semaglutide-Weight Management (WEGOVY) 1 MG/0.5ML SOAJ; Inject 1 mg into the skin once a week.  Dispense: 2 mL; Refill: 0  Exercise goals: Some exercise  Behavioral modification strategies: increasing lean protein intake, meal planning and cooking strategies, keeping healthy foods in the home and planning for success.  Carrie Buck has agreed to follow-up with our clinic in 2 weeks. She was informed of the importance of frequent follow-up visits to maximize her success with intensive lifestyle modifications for her multiple health conditions.    Objective:   Blood pressure 123/87, pulse 84, temperature 98.3 F (36.8 C), temperature source Oral, height 5\' 6"  (1.676 m), weight 258 lb (117 kg), SpO2 97 %. Body mass index is 41.64 kg/m.  General: Cooperative, alert, well developed, in no acute distress. HEENT: Conjunctivae and lids unremarkable. Cardiovascular: Regular rhythm.  Lungs: Normal work of breathing. Neurologic: No focal deficits.   Lab Results  Component Value Date   CREATININE 0.77 10/23/2019   BUN 11 10/23/2019   NA 138 10/23/2019   K 4.3 10/23/2019   CL 102 10/23/2019   CO2 22 10/23/2019   Lab Results  Component Value Date   ALT 14 10/23/2019   AST 10 10/23/2019   ALKPHOS 57 10/23/2019   BILITOT  0.3 10/23/2019   Lab Results  Component Value Date   HGBA1C 5.3 10/23/2019   HGBA1C 5.2 02/20/2018   Lab Results  Component Value Date   INSULIN 16.9 10/23/2019   INSULIN 20.8 06/18/2019   INSULIN 15.3 02/20/2018   Lab Results  Component Value Date   TSH 2.680 02/20/2018   Lab Results  Component Value Date   CHOL 182 02/20/2018   HDL 57 02/20/2018    LDLCALC 88 02/20/2018   TRIG 184 (H) 02/20/2018   Lab Results  Component Value Date   WBC 7.6 06/18/2019   HGB 14.1 06/18/2019   HCT 41.9 06/18/2019   MCV 89 06/18/2019   PLT 252 06/18/2019   No results found for: IRON, TIBC, FERRITIN   I, Delorse Limber, am acting as transcriptionist for Reuben Likes, MD.  I have reviewed the above documentation for accuracy and completeness, and I agree with the above. - Katherina Mires, MD

## 2020-02-24 ENCOUNTER — Encounter (INDEPENDENT_AMBULATORY_CARE_PROVIDER_SITE_OTHER): Payer: Self-pay | Admitting: Family Medicine

## 2020-02-26 ENCOUNTER — Encounter (INDEPENDENT_AMBULATORY_CARE_PROVIDER_SITE_OTHER): Payer: Self-pay | Admitting: Family Medicine

## 2020-02-26 ENCOUNTER — Telehealth (INDEPENDENT_AMBULATORY_CARE_PROVIDER_SITE_OTHER): Payer: 59 | Admitting: Family Medicine

## 2020-02-26 ENCOUNTER — Other Ambulatory Visit: Payer: Self-pay

## 2020-02-26 DIAGNOSIS — E559 Vitamin D deficiency, unspecified: Secondary | ICD-10-CM | POA: Diagnosis not present

## 2020-02-26 DIAGNOSIS — Z6841 Body Mass Index (BMI) 40.0 and over, adult: Secondary | ICD-10-CM | POA: Diagnosis not present

## 2020-02-26 DIAGNOSIS — R7303 Prediabetes: Secondary | ICD-10-CM | POA: Diagnosis not present

## 2020-02-26 MED ORDER — WEGOVY 1 MG/0.5ML ~~LOC~~ SOAJ: 1.0000 mg | SUBCUTANEOUS | 0 refills | Status: DC

## 2020-03-02 NOTE — Progress Notes (Signed)
TeleHealth Visit:  Due to the COVID-19 pandemic, this visit was completed with telemedicine (audio/video) technology to reduce patient and provider exposure as well as to preserve personal protective equipment.   Carrie Buck has verbally consented to this TeleHealth visit. The patient is located at home, the provider is located at the [facility] office. The participants in this visit include the listed provider and patient.The visit was conducted today via MyChart Video.   Chief Complaint: OBESITY Carrie Buck is here to discuss her progress with her obesity treatment plan along with follow-up of her obesity related diagnoses. Carrie Buck is on the Category 3 Plan and states she is following her eating plan approximately 60% of the time. Carrie Buck states she is not exercising.  Today's visit was #: 18 Starting weight: 260 lbs Starting date: 02/10/2018  Interim History: Carrie Buck tested positive for COVID last year and had symptoms for a week. Weight at home of 255.2 this morning. In terms of meal plan, Carrie Buck did a significant amount of soup and gatorade. Past few days has been on plan around 90%.   Subjective:   1. Vitamin D deficiency  Carrie Buck denies nausea, vomiting, or muscle weakness, but notes fatigue. Carrie Buck is on prescription Vitamin D.  2. Prediabetes Suzzane has a diagnosis of prediabetes based on her elevated HgA1c and was informed this puts her at greater risk of developing diabetes. She continues to work on diet and exercise to decrease her risk of diabetes. She denies nausea or hypoglycemia. Carrie Buck is on 1 mg of GLP-1 with minimal cravings.  Lab Results  Component Value Date   HGBA1C 5.3 10/23/2019   Lab Results  Component Value Date   INSULIN 16.9 10/23/2019   INSULIN 20.8 06/18/2019   INSULIN 15.3 02/20/2018    Assessment/Plan:   1. Vitamin D deficiency Low Vitamin D level contributes to fatigue and are associated with obesity, breast, and colon cancer. She  agrees to continue to take prescription Vitamin D @50 ,000 IU every week and will follow-up for routine testing of Vitamin D, at least 2-3 times per year to avoid over-replacement. Carrie Buck will continue on Vitamin D. No refill needed.  2. Prediabetes Carrie Buck will continue to work on weight loss, exercise, and decreasing simple carbohydrates to help decrease the risk of diabetes. Carrie Buck will continue [facility] with no change in dose.   3. Class 3 severe obesity with serious comorbidity and body mass index (BMI) of 40.0 to 44.9 in adult, unspecified obesity type (HCC)  Carrie Buck is currently in the action stage of change. As such, her goal is to continue with weight loss efforts. She has agreed to the Category 3 Plan.   - Semaglutide-Weight Management (WEGOVY) 1 MG/0.5ML SOAJ; Inject 1 mg into the skin once a week.  Dispense: 2 mL; Refill: 0   Exercise goals: Some exercise.  Behavioral modification strategies: increasing lean protein intake, meal planning and cooking strategies, keeping healthy foods in the home and planning for success.  Carrie Buck has agreed to follow-up with our clinic in 2 weeks. She was informed of the importance of frequent follow-up visits to maximize her success with intensive lifestyle modifications for her multiple health conditions.   Objective:   VITALS: Per patient if applicable, see vitals. GENERAL: Alert and in no acute distress. CARDIOPULMONARY: No increased WOB. Speaking in clear sentences.  PSYCH: Pleasant and cooperative. Speech normal rate and rhythm. Affect is appropriate. Insight and judgement are appropriate. Attention is focused, linear, and appropriate.  NEURO: Oriented as arrived  to appointment on time with no prompting.   Lab Results  Component Value Date   CREATININE 0.77 10/23/2019   BUN 11 10/23/2019   NA 138 10/23/2019   K 4.3 10/23/2019   CL 102 10/23/2019   CO2 22 10/23/2019   Lab Results  Component Value Date   ALT 14 10/23/2019   AST 10  10/23/2019   ALKPHOS 57 10/23/2019   BILITOT 0.3 10/23/2019   Lab Results  Component Value Date   HGBA1C 5.3 10/23/2019   HGBA1C 5.2 02/20/2018   Lab Results  Component Value Date   INSULIN 16.9 10/23/2019   INSULIN 20.8 06/18/2019   INSULIN 15.3 02/20/2018   Lab Results  Component Value Date   TSH 2.680 02/20/2018   Lab Results  Component Value Date   CHOL 182 02/20/2018   HDL 57 02/20/2018   LDLCALC 88 02/20/2018   TRIG 184 (H) 02/20/2018   Lab Results  Component Value Date   WBC 7.6 06/18/2019   HGB 14.1 06/18/2019   HCT 41.9 06/18/2019   MCV 89 06/18/2019   PLT 252 06/18/2019   No results found for: IRON, TIBC, FERRITIN  Attestation Statements:   Reviewed by clinician on day of visit: allergies, medications, problem list, medical history, surgical history, family history, social history, and previous encounter notes.    I, Delorse Limber, am acting as transcriptionist for Reuben Likes, MD.  I have reviewed the above documentation for accuracy and completeness, and I agree with the above. - Katherina Mires, MD

## 2020-03-16 ENCOUNTER — Encounter (INDEPENDENT_AMBULATORY_CARE_PROVIDER_SITE_OTHER): Payer: Self-pay | Admitting: Family Medicine

## 2020-03-16 ENCOUNTER — Ambulatory Visit (INDEPENDENT_AMBULATORY_CARE_PROVIDER_SITE_OTHER): Payer: 59 | Admitting: Family Medicine

## 2020-03-16 ENCOUNTER — Other Ambulatory Visit: Payer: Self-pay

## 2020-03-16 VITALS — BP 118/80 | HR 80 | Temp 97.5°F | Ht 66.0 in | Wt 252.0 lb

## 2020-03-16 DIAGNOSIS — E559 Vitamin D deficiency, unspecified: Secondary | ICD-10-CM | POA: Diagnosis not present

## 2020-03-16 DIAGNOSIS — Z6841 Body Mass Index (BMI) 40.0 and over, adult: Secondary | ICD-10-CM | POA: Diagnosis not present

## 2020-03-16 DIAGNOSIS — R7303 Prediabetes: Secondary | ICD-10-CM

## 2020-03-17 NOTE — Progress Notes (Signed)
Chief Complaint:   OBESITY Jaianna is here to discuss her progress with her obesity treatment plan along with follow-up of her obesity related diagnoses. Shirley is on the Category 3 Plan and states she is following her eating plan approximately 75-80% of the time. Walaa states she is walking 30 minutes 4 times per week.  Today's visit was #: 19 Starting weight: 260 lbs Starting date: 02/10/2018 Today's weight: 252 lbs Today's date: 03/16/2020 Total lbs lost to date: 8 lbs Total lbs lost since last in-office visit: 6 lbs  Interim History: Torrin has been able to recommit to the meal plan. Sometimes she is craving chicken noodle soup from a can and she will indulge in this secondary to fatigue. She has been doing some more walking recently.  Subjective:   1. Vitamin D deficiency Pt denies nausea, vomiting, and muscle weakness but notes fatigue. Pt is on OTC Vit D 10,000 unit 3 times a week.  2. Pre-diabetes Pt's last A1c was controlled at 5.3. She is on Metformin with no GI side effects.  Lab Results  Component Value Date   HGBA1C 5.3 10/23/2019   Lab Results  Component Value Date   INSULIN 16.9 10/23/2019   INSULIN 20.8 06/18/2019   INSULIN 15.3 02/20/2018     Assessment/Plan:   1. Vitamin D deficiency Low Vitamin D level contributes to fatigue and are associated with obesity, breast, and colon cancer. She agrees to continue to take OTC Vit D 10,000 units 3 times a week and will follow-up for routine testing of Vitamin D, at least 2-3 times per year to avoid over-replacement.  2. Pre-diabetes Maebell will continue to work on weight loss, exercise, and decreasing simple carbohydrates to help decrease the risk of diabetes. Check labs at next appt.  3. Class 3 severe obesity with serious comorbidity and body mass index (BMI) of 40.0 to 44.9 in adult, unspecified obesity type (HCC) Sholanda is currently in the action stage of change. As such, her goal is to continue with  weight loss efforts. She has agreed to the Category 3 Plan.   Continue Wegovy with no change in dose.  Exercise goals: As is  Behavioral modification strategies: increasing lean protein intake, meal planning and cooking strategies, keeping healthy foods in the home and planning for success.  [name] has agreed to follow-up with our clinic in 2-3 weeks. She was informed of the importance of frequent follow-up visits to maximize her success with intensive lifestyle modifications for her multiple health conditions.   Objective:   Blood pressure 118/80, pulse 80, temperature (!) 97.5 F (36.4 C), temperature source Oral, height 5\' 6"  (1.676 m), weight 252 lb (114.3 kg), last menstrual period 03/11/2020, SpO2 100 %. Body mass index is 40.67 kg/m.  General: Cooperative, alert, well developed, in no acute distress. HEENT: Conjunctivae and lids unremarkable. Cardiovascular: Regular rhythm.  Lungs: Normal work of breathing. Neurologic: No focal deficits.   Lab Results  Component Value Date   CREATININE 0.77 10/23/2019   BUN 11 10/23/2019   NA 138 10/23/2019   K 4.3 10/23/2019   CL 102 10/23/2019   CO2 22 10/23/2019   Lab Results  Component Value Date   ALT 14 10/23/2019   AST 10 10/23/2019   ALKPHOS 57 10/23/2019   BILITOT 0.3 10/23/2019   Lab Results  Component Value Date   HGBA1C 5.3 10/23/2019   HGBA1C 5.2 02/20/2018   Lab Results  Component Value Date   INSULIN 16.9 10/23/2019  INSULIN 20.8 06/18/2019   INSULIN 15.3 02/20/2018   Lab Results  Component Value Date   TSH 2.680 02/20/2018   Lab Results  Component Value Date   CHOL 182 02/20/2018   HDL 57 02/20/2018   LDLCALC 88 02/20/2018   TRIG 184 (H) 02/20/2018   Lab Results  Component Value Date   WBC 7.6 06/18/2019   HGB 14.1 06/18/2019   HCT 41.9 06/18/2019   MCV 89 06/18/2019   PLT 252 06/18/2019   No results found for: IRON, TIBC, FERRITIN  Attestation Statements:   Reviewed by clinician on day  of visit: allergies, medications, problem list, medical history, surgical history, family history, social history, and previous encounter notes.  Time spent on visit including pre-visit chart review and post-visit care and charting was 16 minutes.   Edmund Hilda, am acting as transcriptionist for Reuben Likes, MD.   I have reviewed the above documentation for accuracy and completeness, and I agree with the above. - Katherina Mires, MD

## 2020-03-18 ENCOUNTER — Other Ambulatory Visit (INDEPENDENT_AMBULATORY_CARE_PROVIDER_SITE_OTHER): Payer: Self-pay | Admitting: Family Medicine

## 2020-03-18 ENCOUNTER — Encounter (INDEPENDENT_AMBULATORY_CARE_PROVIDER_SITE_OTHER): Payer: Self-pay

## 2020-03-18 DIAGNOSIS — E8881 Metabolic syndrome: Secondary | ICD-10-CM

## 2020-03-18 NOTE — Telephone Encounter (Signed)
Message sent to pt-CAS 

## 2020-04-05 ENCOUNTER — Ambulatory Visit (INDEPENDENT_AMBULATORY_CARE_PROVIDER_SITE_OTHER): Payer: 59 | Admitting: Family Medicine

## 2020-04-05 ENCOUNTER — Other Ambulatory Visit: Payer: Self-pay

## 2020-04-05 ENCOUNTER — Encounter (INDEPENDENT_AMBULATORY_CARE_PROVIDER_SITE_OTHER): Payer: Self-pay | Admitting: Family Medicine

## 2020-04-05 VITALS — BP 113/75 | HR 83 | Temp 97.5°F | Ht 66.0 in | Wt 251.0 lb

## 2020-04-05 DIAGNOSIS — Z6841 Body Mass Index (BMI) 40.0 and over, adult: Secondary | ICD-10-CM | POA: Diagnosis not present

## 2020-04-05 DIAGNOSIS — E559 Vitamin D deficiency, unspecified: Secondary | ICD-10-CM

## 2020-04-05 DIAGNOSIS — R7303 Prediabetes: Secondary | ICD-10-CM | POA: Diagnosis not present

## 2020-04-05 DIAGNOSIS — E8881 Metabolic syndrome: Secondary | ICD-10-CM

## 2020-04-05 MED ORDER — METFORMIN HCL 500 MG PO TABS: 500.0000 mg | ORAL_TABLET | Freq: Every day | ORAL | 0 refills | Status: DC

## 2020-04-05 MED ORDER — WEGOVY 1 MG/0.5ML ~~LOC~~ SOAJ: 1.0000 mg | SUBCUTANEOUS | 0 refills | Status: DC

## 2020-04-06 LAB — LIPID PANEL WITH LDL/HDL RATIO
Cholesterol, Total: 190 mg/dL (ref 100–199)
HDL: 52 mg/dL (ref >39)
LDL Chol Calc (NIH): 108 mg/dL — ABNORMAL HIGH (ref 0–99)
LDL/HDL Ratio: 2.1 ratio (ref 0.0–3.2)
Triglycerides: 171 mg/dL — ABNORMAL HIGH (ref 0–149)
VLDL Cholesterol Cal: 30 mg/dL (ref 5–40)

## 2020-04-06 LAB — COMPREHENSIVE METABOLIC PANEL
ALT: 16 IU/L (ref 0–32)
AST: 15 IU/L (ref 0–40)
Albumin/Globulin Ratio: 1.4 (ref 1.2–2.2)
Albumin: 4.3 g/dL (ref 3.8–4.8)
Alkaline Phosphatase: 63 IU/L (ref 44–121)
BUN/Creatinine Ratio: 13 (ref 9–23)
BUN: 11 mg/dL (ref 6–20)
Bilirubin Total: 0.4 mg/dL (ref 0.0–1.2)
CO2: 20 mmol/L (ref 20–29)
Calcium: 9.7 mg/dL (ref 8.7–10.2)
Chloride: 103 mmol/L (ref 96–106)
Creatinine, Ser: 0.87 mg/dL (ref 0.57–1.00)
Globulin, Total: 3.1 g/dL (ref 1.5–4.5)
Glucose: 91 mg/dL (ref 65–99)
Potassium: 4.3 mmol/L (ref 3.5–5.2)
Sodium: 138 mmol/L (ref 134–144)
Total Protein: 7.4 g/dL (ref 6.0–8.5)
eGFR: 87 mL/min/{1.73_m2} (ref >59)

## 2020-04-06 LAB — VITAMIN D 25 HYDROXY (VIT D DEFICIENCY, FRACTURES): Vit D, 25-Hydroxy: 50.3 ng/mL (ref 30.0–100.0)

## 2020-04-06 LAB — HEMOGLOBIN A1C
Est. average glucose Bld gHb Est-mCnc: 103 mg/dL
Hgb A1c MFr Bld: 5.2 % (ref 4.8–5.6)

## 2020-04-06 LAB — INSULIN, RANDOM: INSULIN: 23.7 u[IU]/mL (ref 2.6–24.9)

## 2020-04-06 NOTE — Progress Notes (Signed)
TeleHealth Visit:  Due to the COVID-19 pandemic, this visit was completed with telemedicine (audio/video) technology to reduce patient and provider exposure as well as to preserve personal protective equipment.   Mashelle has verbally consented to this TeleHealth visit. The patient is located at home, the provider is located at the [facility] office. The participants in this visit include the listed provider and patient. The visit was conducted today via video.   Chief Complaint: OBESITY Iretha is here to discuss her progress with her obesity treatment plan along with follow-up of her obesity related diagnoses. Amberly is on the Category 3 Plan and states she is following her eating plan approximately 70-80% of the time. Shaley states she is doing 0 minutes 0 times per week.  Today's visit was #: 20 Starting weight: 260 lbs Starting date: 02/10/2018  Interim History: Jeanni reports a weight of 251 lbs today. The last few weeks she was able to follow the plan fairly strictly but did go out a few times for a date night and did get out a bit more with more walking. She realizes that she has held steady weight wise and is feeling good about that. She has no plans to go anywhere until the end of April.  Subjective:   1. Pre-diabetes Lavette is still struggling to get all protein in. She is on Metformin and GLP-1.  2. Vitamin D deficiency Pt denies nausea, vomiting, and muscle weakness but notes fatigue. Pt is on Vit D 10,000 IU 3 days a week.  Assessment/Plan:   1. Pre-diabetes Audry will continue to work on weight loss, exercise, and decreasing simple carbohydrates to help decrease the risk of diabetes. Check labs today.  - metFORMIN (GLUCOPHAGE) 500 MG tablet; Take 1 tablet (500 mg total) by mouth daily with breakfast.  Dispense: 90 tablet; Refill: 0  - Comprehensive metabolic panel - Hemoglobin A1c - Insulin, random - Lipid Panel With LDL/HDL Ratio  2.  Vitamin D deficiency Low Vitamin D level contributes to fatigue and are associated with obesity, breast, and colon cancer. She agrees to continue to take Vitamin D @10 ,000 IU three days a week and will follow-up for routine testing of Vitamin D, at least 2-3 times per year to avoid over-replacement. Check labs today.  - VITAMIN D 25 Hydroxy (Vit-D Deficiency, Fractures)  3. Class 3 severe obesity with serious comorbidity and body mass index (BMI) of 40.0 to 44.9 in adult, unspecified obesity type (HCC) Verne is currently in the action stage of change. As such, her goal is to continue with weight loss efforts. She has agreed to the Category 3 Plan.   We discussed various medication options to help Belenda with her weight loss efforts and we both agreed to continue Aldine, as per below. - Semaglutide-Weight Management (WEGOVY) 1 MG/0.5ML SOAJ; Inject 1 mg into the skin once a week.  Dispense: 2 mL; Refill: 0  Exercise goals: All adults should avoid inactivity. Some physical activity is better than none, and adults who participate in any amount of physical activity gain some health benefits. Adding in resistance training  Behavioral modification strategies: increasing lean protein intake, meal planning and cooking strategies, keeping healthy foods in the home and planning for success.  Shaila has agreed to follow-up with our clinic in 2 weeks. She was informed of the importance of frequent follow-up visits to maximize her success with intensive lifestyle modifications for her multiple health conditions.  Objective:   VITALS: Per patient if applicable,  see vitals. GENERAL: Alert and in no acute distress. CARDIOPULMONARY: No increased WOB. Speaking in clear sentences.  PSYCH: Pleasant and cooperative. Speech normal rate and rhythm. Affect is appropriate. Insight and judgement are appropriate. Attention is focused, linear, and appropriate.  NEURO: Oriented as arrived to appointment on time with  no prompting.   Lab Results  Component Value Date   CREATININE 0.87 04/05/2020   BUN 11 04/05/2020   NA 138 04/05/2020   K 4.3 04/05/2020   CL 103 04/05/2020   CO2 20 04/05/2020   Lab Results  Component Value Date   ALT 16 04/05/2020   AST 15 04/05/2020   ALKPHOS 63 04/05/2020   BILITOT 0.4 04/05/2020   Lab Results  Component Value Date   HGBA1C 5.2 04/05/2020   HGBA1C 5.3 10/23/2019   HGBA1C 5.2 02/20/2018   Lab Results  Component Value Date   INSULIN 23.7 04/05/2020   INSULIN 16.9 10/23/2019   INSULIN 20.8 06/18/2019   INSULIN 15.3 02/20/2018   Lab Results  Component Value Date   TSH 2.680 02/20/2018   Lab Results  Component Value Date   CHOL 190 04/05/2020   HDL 52 04/05/2020   LDLCALC 108 (H) 04/05/2020   TRIG 171 (H) 04/05/2020   Lab Results  Component Value Date   WBC 7.6 06/18/2019   HGB 14.1 06/18/2019   HCT 41.9 06/18/2019   MCV 89 06/18/2019   PLT 252 06/18/2019   No results found for: IRON, TIBC, FERRITIN  Attestation Statements:   Reviewed by clinician on day of visit: allergies, medications, problem list, medical history, surgical history, family history, social history, and previous encounter notes.  Edmund Hilda, am acting as transcriptionist for Reuben Likes, MD.  I have reviewed the above documentation for accuracy and completeness, and I agree with the above. - Katherina Mires, MD

## 2020-04-14 ENCOUNTER — Encounter (INDEPENDENT_AMBULATORY_CARE_PROVIDER_SITE_OTHER): Payer: Self-pay | Admitting: Family Medicine

## 2020-04-15 NOTE — Telephone Encounter (Signed)
FYI

## 2020-04-15 NOTE — Telephone Encounter (Signed)
Please advise 

## 2020-04-20 ENCOUNTER — Other Ambulatory Visit (INDEPENDENT_AMBULATORY_CARE_PROVIDER_SITE_OTHER): Payer: Self-pay

## 2020-04-20 NOTE — Telephone Encounter (Signed)
Response. [facility] already in as pharmacy

## 2020-04-21 ENCOUNTER — Ambulatory Visit (INDEPENDENT_AMBULATORY_CARE_PROVIDER_SITE_OTHER): Payer: 59 | Admitting: Physician Assistant

## 2020-05-05 ENCOUNTER — Ambulatory Visit (INDEPENDENT_AMBULATORY_CARE_PROVIDER_SITE_OTHER): Payer: 59 | Admitting: Family Medicine

## 2020-05-05 ENCOUNTER — Encounter (INDEPENDENT_AMBULATORY_CARE_PROVIDER_SITE_OTHER): Payer: Self-pay | Admitting: Family Medicine

## 2020-05-05 ENCOUNTER — Other Ambulatory Visit: Payer: Self-pay

## 2020-05-05 VITALS — BP 126/80 | HR 77 | Temp 98.1°F | Ht 66.0 in | Wt 253.0 lb

## 2020-05-05 DIAGNOSIS — E8881 Metabolic syndrome: Secondary | ICD-10-CM

## 2020-05-05 DIAGNOSIS — E559 Vitamin D deficiency, unspecified: Secondary | ICD-10-CM | POA: Diagnosis not present

## 2020-05-05 DIAGNOSIS — Z9189 Other specified personal risk factors, not elsewhere classified: Secondary | ICD-10-CM | POA: Diagnosis not present

## 2020-05-05 DIAGNOSIS — Z6841 Body Mass Index (BMI) 40.0 and over, adult: Secondary | ICD-10-CM

## 2020-05-05 MED ORDER — OZEMPIC (0.25 OR 0.5 MG/DOSE) 2 MG/1.5ML ~~LOC~~ SOPN: 0.2500 mg | PEN_INJECTOR | SUBCUTANEOUS | 0 refills | Status: DC

## 2020-05-10 ENCOUNTER — Other Ambulatory Visit (INDEPENDENT_AMBULATORY_CARE_PROVIDER_SITE_OTHER): Payer: Self-pay | Admitting: Family Medicine

## 2020-05-10 DIAGNOSIS — E8881 Metabolic syndrome: Secondary | ICD-10-CM

## 2020-05-11 NOTE — Telephone Encounter (Signed)
Dr.Ukleja 

## 2020-05-12 NOTE — Progress Notes (Signed)
Chief Complaint:   OBESITY Carrie Buck is here to discuss her progress with her obesity treatment plan along with follow-up of her obesity related diagnoses. Carrie Buck is on the Category 3 Plan and states she is following her eating plan approximately 70% of the time. Carrie Buck states she is walking and hiking 45 minutes 2 times per week.  Today's visit was #: 21 Starting weight: 260 lbs Starting date: 02/10/2018 Today's weight: 253 lbs Today's date: 05/05/2020 Total lbs lost to date: 7 Total lbs lost since last in-office visit: 0  Interim History: Carrie Buck had last minute travel to [place] for work over the last few weeks. Over the weekends, she has been doing hybrid of vegetarian and category 3. Pt is going to [employer] at the end of April for herself. She is finding herself to be snacking more from kids snack options  Subjective:   1. Metabolic syndrome Carrie Buck has been off [facility] for 1.5 months. She has an increased drive to eat.  2. Vitamin D deficiency Carrie Buck is on OTC Vit D 10K IU 3 times a week. She reports fatigue.  3. At risk for side effect of medication Carrie Buck is at risk for side effect of medication due to starting Ozempic.  Assessment/Plan:   1. Metabolic syndrome Start Ozempic 0.25 mg SubQ weekly Disp 1.5 ml with no refills.  2. Vitamin D deficiency Low Vitamin D level contributes to fatigue and are associated with obesity, breast, and colon cancer. She agrees to continue to take OTC Vitamin D @10 ,000 IU 3 times a week and will follow-up for routine testing of Vitamin D, at least 2-3 times per year to avoid over-replacement.  3. At risk for side effect of medication Carrie Buck was given approximately 15 minutes of drug side effect counseling today.  We discussed side effect possibility and risk versus benefits. Carrie Buck agreed to the medication and will contact this office if these side effects are intolerable.  Repetitive spaced learning was employed today to elicit  superior memory formation and behavioral change.  4. Class 3 severe obesity with serious comorbidity and body mass index (BMI) of 40.0 to 44.9 in adult, unspecified obesity type (HCC) Carrie Buck is currently in the action stage of change. As such, her goal is to continue with weight loss efforts. She has agreed to the Category 3 Plan and the Vegetarian Plan + 300 calories.   Exercise goals: As is  Behavioral modification strategies: increasing lean protein intake, meal planning and cooking strategies, keeping healthy foods in the home and travel eating strategies.  Carrie Buck has agreed to follow-up with our clinic in 3 weeks. She was informed of the importance of frequent follow-up visits to maximize her success with intensive lifestyle modifications for her multiple health conditions.   Objective:   Blood pressure 126/80, pulse 77, temperature 98.1 F (36.7 C), height 5\' 6"  (1.676 m), weight 253 lb (114.8 kg), SpO2 98 %. Body mass index is 40.84 kg/m.  General: Cooperative, alert, well developed, in no acute distress. HEENT: Conjunctivae and lids unremarkable. Cardiovascular: Regular rhythm.  Lungs: Normal work of breathing. Neurologic: No focal deficits.   Lab Results  Component Value Date   CREATININE 0.87 04/05/2020   BUN 11 04/05/2020   NA 138 04/05/2020   K 4.3 04/05/2020   CL 103 04/05/2020   CO2 20 04/05/2020   Lab Results  Component Value Date   ALT 16 04/05/2020   AST 15 04/05/2020   ALKPHOS 63 04/05/2020   BILITOT 0.4  04/05/2020   Lab Results  Component Value Date   HGBA1C 5.2 04/05/2020   HGBA1C 5.3 10/23/2019   HGBA1C 5.2 02/20/2018   Lab Results  Component Value Date   INSULIN 23.7 04/05/2020   INSULIN 16.9 10/23/2019   INSULIN 20.8 06/18/2019   INSULIN 15.3 02/20/2018   Lab Results  Component Value Date   TSH 2.680 02/20/2018   Lab Results  Component Value Date   CHOL 190 04/05/2020   HDL 52 04/05/2020   LDLCALC 108 (H) 04/05/2020   TRIG 171 (H)  04/05/2020   Lab Results  Component Value Date   WBC 7.6 06/18/2019   HGB 14.1 06/18/2019   HCT 41.9 06/18/2019   MCV 89 06/18/2019   PLT 252 06/18/2019    Attestation Statements:   Reviewed by clinician on day of visit: allergies, medications, problem list, medical history, surgical history, family history, social history, and previous encounter notes.  Edmund Hilda, am acting as transcriptionist for Reuben Likes, MD.   A separate 15 minutes was spent on risk counseling (see above).  I have reviewed the above documentation for accuracy and completeness, and I agree with the above. - Katherina Mires, MD

## 2020-05-26 ENCOUNTER — Ambulatory Visit (INDEPENDENT_AMBULATORY_CARE_PROVIDER_SITE_OTHER): Payer: 59 | Admitting: Family Medicine

## 2020-05-26 ENCOUNTER — Encounter (INDEPENDENT_AMBULATORY_CARE_PROVIDER_SITE_OTHER): Payer: Self-pay | Admitting: Family Medicine

## 2020-05-26 ENCOUNTER — Other Ambulatory Visit: Payer: Self-pay

## 2020-05-26 VITALS — BP 118/83 | HR 84 | Temp 97.8°F | Ht 66.0 in | Wt 254.0 lb

## 2020-05-26 DIAGNOSIS — Z9189 Other specified personal risk factors, not elsewhere classified: Secondary | ICD-10-CM

## 2020-05-26 DIAGNOSIS — Z6841 Body Mass Index (BMI) 40.0 and over, adult: Secondary | ICD-10-CM | POA: Diagnosis not present

## 2020-05-26 DIAGNOSIS — E782 Mixed hyperlipidemia: Secondary | ICD-10-CM

## 2020-05-26 DIAGNOSIS — E8881 Metabolic syndrome: Secondary | ICD-10-CM | POA: Diagnosis not present

## 2020-05-26 MED ORDER — OZEMPIC (0.25 OR 0.5 MG/DOSE) 2 MG/1.5ML ~~LOC~~ SOPN: 0.5000 mg | PEN_INJECTOR | SUBCUTANEOUS | 0 refills | Status: DC

## 2020-05-27 NOTE — Progress Notes (Signed)
Chief Complaint:   OBESITY Carrie Buck is here to discuss her progress with her obesity treatment plan along with follow-up of her obesity related diagnoses. Carrie Buck is on the Category 3 Plan and the Vegetarian Plan + 300 calories and states she is following her eating plan approximately 30% of the time. Carrie Buck states she is walking 30 minutes 3 times per week.  Today's visit was #: 22 Starting weight: 260 lbs Starting date: 02/20/2018 Today's weight: 254 lbs Today's date: 05/26/2020 Total lbs lost to date: 6 Total lbs lost since last in-office visit: 0  Interim History: Carrie Buck voices that she got the promotion at work and she has had to re-evaluate her work/life schedule. She is skipping breakfast/meals frequently. She has family coming in town this weekend and she and her husband have plans to get back in plan.  Subjective:   1. Metabolic syndrome Carrie Buck started Ozempic 0.25 mg subq weekly. She reports some improvement in control.  2. Mixed hyperlipidemia Carrie Buck's LDL is 108, HDL 52, and triglycerides 527. She is not on statin therapy.  3. At risk for side effect of medication Carrie Buck is at risk for side effects of medication due to increasing dose of Ozempic.   Assessment/Plan:   1. Metabolic syndrome Refill and increase Ozempic to 0.5 mg, as prescribed below.  - Semaglutide,0.25 or 0.5MG /DOS, (OZEMPIC, 0.25 OR 0.5 MG/DOSE,) 2 MG/1.5ML SOPN; Inject 0.5 mg into the skin once a week. DX Code: E88.81  Dispense: 1.5 mL; Refill: 0  2. Mixed hyperlipidemia Cardiovascular risk and specific lipid/LDL goals reviewed.  We discussed several lifestyle modifications today and Carrie Buck will continue to work on diet, exercise and weight loss efforts. Orders and follow up as documented in patient record. Repeat labs in July.  Counseling Intensive lifestyle modifications are the first line treatment for this issue. . Dietary changes: Increase soluble fiber. Decrease simple  carbohydrates. . Exercise changes: Moderate to vigorous-intensity aerobic activity 150 minutes per week if tolerated. . Lipid-lowering medications: see documented in medical record.  3. At risk for side effect of medication Carrie Buck was given approximately 15 minutes of drug side effect counseling today.  We discussed side effect possibility and risk versus benefits. Carrie Buck agreed to the medication and will contact this office if these side effects are intolerable.  Repetitive spaced learning was employed today to elicit superior memory formation and behavioral change.  4. Class 3 severe obesity with serious comorbidity and body mass index (BMI) of 50.0 to 59.9 in adult, unspecified obesity type (HCC) Carrie Buck is currently in the action stage of change. As such, her goal is to continue with weight loss efforts. She has agreed to the Category 3 Plan and the Vegetarian Plan + 300 calories.   Exercise goals: As is  Behavioral modification strategies: increasing lean protein intake, meal planning and cooking strategies, keeping healthy foods in the home and planning for success.  Carrie Buck has agreed to follow-up with our clinic in 3 weeks. She was informed of the importance of frequent follow-up visits to maximize her success with intensive lifestyle modifications for her multiple health conditions.   Objective:   Blood pressure 118/83, pulse 84, temperature 97.8 F (36.6 C), height 5\' 6"  (1.676 m), weight 254 lb (115.2 kg), SpO2 97 %. Body mass index is 41 kg/m.  General: Cooperative, alert, well developed, in no acute distress. HEENT: Conjunctivae and lids unremarkable. Cardiovascular: Regular rhythm.  Lungs: Normal work of breathing. Neurologic: No focal deficits.   Lab Results  Component  Value Date   CREATININE 0.87 04/05/2020   BUN 11 04/05/2020   NA 138 04/05/2020   K 4.3 04/05/2020   CL 103 04/05/2020   CO2 20 04/05/2020   Lab Results  Component Value Date   ALT 16  04/05/2020   AST 15 04/05/2020   ALKPHOS 63 04/05/2020   BILITOT 0.4 04/05/2020   Lab Results  Component Value Date   HGBA1C 5.2 04/05/2020   HGBA1C 5.3 10/23/2019   HGBA1C 5.2 02/20/2018   Lab Results  Component Value Date   INSULIN 23.7 04/05/2020   INSULIN 16.9 10/23/2019   INSULIN 20.8 06/18/2019   INSULIN 15.3 02/20/2018   Lab Results  Component Value Date   TSH 2.680 02/20/2018   Lab Results  Component Value Date   CHOL 190 04/05/2020   HDL 52 04/05/2020   LDLCALC 108 (H) 04/05/2020   TRIG 171 (H) 04/05/2020   Lab Results  Component Value Date   WBC 7.6 06/18/2019   HGB 14.1 06/18/2019   HCT 41.9 06/18/2019   MCV 89 06/18/2019   PLT 252 06/18/2019    Attestation Statements:   Reviewed by clinician on day of visit: allergies, medications, problem list, medical history, surgical history, family history, social history, and previous encounter notes.  Edmund Hilda, am acting as transcriptionist for Reuben Likes, MD.   I have reviewed the above documentation for accuracy and completeness, and I agree with the above. - Katherina Mires, MD

## 2020-06-21 ENCOUNTER — Encounter (INDEPENDENT_AMBULATORY_CARE_PROVIDER_SITE_OTHER): Payer: Self-pay | Admitting: Family Medicine

## 2020-06-21 ENCOUNTER — Ambulatory Visit (INDEPENDENT_AMBULATORY_CARE_PROVIDER_SITE_OTHER): Payer: 59 | Admitting: Family Medicine

## 2020-06-21 ENCOUNTER — Other Ambulatory Visit: Payer: Self-pay

## 2020-06-21 VITALS — BP 127/85 | HR 85 | Temp 98.6°F | Ht 66.0 in | Wt 258.0 lb

## 2020-06-21 DIAGNOSIS — Z6841 Body Mass Index (BMI) 40.0 and over, adult: Secondary | ICD-10-CM

## 2020-06-21 DIAGNOSIS — E559 Vitamin D deficiency, unspecified: Secondary | ICD-10-CM | POA: Diagnosis not present

## 2020-06-21 DIAGNOSIS — Z9189 Other specified personal risk factors, not elsewhere classified: Secondary | ICD-10-CM | POA: Diagnosis not present

## 2020-06-21 DIAGNOSIS — E8881 Metabolic syndrome: Secondary | ICD-10-CM | POA: Diagnosis not present

## 2020-06-21 MED ORDER — OZEMPIC (1 MG/DOSE) 2 MG/1.5ML ~~LOC~~ SOPN: 1.0000 mg | PEN_INJECTOR | SUBCUTANEOUS | 0 refills | Status: DC

## 2020-06-22 NOTE — Progress Notes (Signed)
Chief Complaint:   OBESITY Carrie Buck is here to discuss her progress with her obesity treatment plan along with follow-up of her obesity related diagnoses. Carrie Buck is on the Category 3 Plan and the Vegetarian Plan + 300 calories and states she is following her eating plan approximately 75-80% of the time. Carrie Buck states she is walking 25-30 minutes 3 times per week.  Today's visit was #: 23 Starting weight: 260 lbs Starting date: 02/20/2018 Today's weight: 258 lbs Today's date: 06/21/2020 Total lbs lost to date: 2 Total lbs lost since last in-office visit: 0  Interim History: Pt had a great Mother's Day- her mother came into town and she voices she went out to [employer]. She has been eating more on plan throughout the day. She hasn't baked or eaten sugary snacks. She is feeling frustrated with weight gain. Some hunger. She is getting 4-6 oz meat at dinner. Pt has a wedding in 4 days.  Subjective:   1. Metabolic syndrome Pt is doing well on Ozempic 0.5 mg SubQ weekly. She denies GI side effects. She is having to remind herself to eat frequently.  2. Vitamin D deficiency Pt denies nausea, vomiting, and muscle weakness but notes fatigue. Pt is on OTC Vit D. Her last Vit D level was 50.3.  3. At risk for side effect of medication Carrie Buck is at risk for side effects of medication due to increasing dose of Ozempic.  Assessment/Plan:   1. Metabolic syndrome Increase Ozempic to 1 mg, as prescribed below.  - Semaglutide, 1 MG/DOSE, (OZEMPIC, 1 MG/DOSE,) 2 MG/1.5ML SOPN; Inject 1 mg into the skin once a week.  Dispense: 1.5 mL; Refill: 0  2. Vitamin D deficiency Low Vitamin D level contributes to fatigue and are associated with obesity, breast, and colon cancer. She agrees to continue to take OTC Vitamin D @10 ,000 IU daily and will follow-up for routine testing of Vitamin D, at least 2-3 times per year to avoid over-replacement.  3. At risk for side effect of medication Carrie Buck was  given approximately 15 minutes of drug side effect counseling today.  We discussed side effect possibility and risk versus benefits. Carrie Buck agreed to the medication and will contact this office if these side effects are intolerable.  Repetitive spaced learning was employed today to elicit superior memory formation and behavioral change.  4. Class 3 severe obesity with serious comorbidity and body mass index (BMI) of 50.0 to 59.9 in adult, unspecified obesity type (HCC)  Carrie Buck is currently in the action stage of change. As such, her goal is to continue with weight loss efforts. She has agreed to the Category 3 Plan and the Vegetarian Plan + 300 calories.   Exercise goals: As is  Behavioral modification strategies: increasing lean protein intake, meal planning and cooking strategies, keeping healthy foods in the home and planning for success.  Carrie Buck has agreed to follow-up with our clinic in 3 weeks. She was informed of the importance of frequent follow-up visits to maximize her success with intensive lifestyle modifications for her multiple health conditions.   Objective:   Blood pressure 127/85, pulse 85, temperature 98.6 F (37 C), height 5\' 6"  (1.676 m), weight 258 lb (117 kg), SpO2 97 %. Body mass index is 41.64 kg/m.  General: Cooperative, alert, well developed, in no acute distress. HEENT: Conjunctivae and lids unremarkable. Cardiovascular: Regular rhythm.  Lungs: Normal work of breathing. Neurologic: No focal deficits.   Lab Results  Component Value Date   CREATININE 0.87 04/05/2020  BUN 11 04/05/2020   NA 138 04/05/2020   K 4.3 04/05/2020   CL 103 04/05/2020   CO2 20 04/05/2020   Lab Results  Component Value Date   ALT 16 04/05/2020   AST 15 04/05/2020   ALKPHOS 63 04/05/2020   BILITOT 0.4 04/05/2020   Lab Results  Component Value Date   HGBA1C 5.2 04/05/2020   HGBA1C 5.3 10/23/2019   HGBA1C 5.2 02/20/2018   Lab Results  Component Value Date   INSULIN  23.7 04/05/2020   INSULIN 16.9 10/23/2019   INSULIN 20.8 06/18/2019   INSULIN 15.3 02/20/2018   Lab Results  Component Value Date   TSH 2.680 02/20/2018   Lab Results  Component Value Date   CHOL 190 04/05/2020   HDL 52 04/05/2020   LDLCALC 108 (H) 04/05/2020   TRIG 171 (H) 04/05/2020   Lab Results  Component Value Date   WBC 7.6 06/18/2019   HGB 14.1 06/18/2019   HCT 41.9 06/18/2019   MCV 89 06/18/2019   PLT 252 06/18/2019   No results found for: IRON, TIBC, FERRITIN   Attestation Statements:   Reviewed by clinician on day of visit: allergies, medications, problem list, medical history, surgical history, family history, social history, and previous encounter notes.  Edmund Hilda, CMA, am acting as transcriptionist for Reuben Likes, MD.   I have reviewed the above documentation for accuracy and completeness, and I agree with the above. - Katherina Mires, MD

## 2020-07-08 ENCOUNTER — Ambulatory Visit (INDEPENDENT_AMBULATORY_CARE_PROVIDER_SITE_OTHER): Payer: 59 | Admitting: Family Medicine

## 2020-07-08 ENCOUNTER — Encounter (INDEPENDENT_AMBULATORY_CARE_PROVIDER_SITE_OTHER): Payer: Self-pay | Admitting: Family Medicine

## 2020-07-08 ENCOUNTER — Other Ambulatory Visit: Payer: Self-pay

## 2020-07-08 VITALS — BP 125/80 | HR 70 | Temp 98.4°F | Ht 66.0 in | Wt 261.0 lb

## 2020-07-08 DIAGNOSIS — Z9189 Other specified personal risk factors, not elsewhere classified: Secondary | ICD-10-CM | POA: Diagnosis not present

## 2020-07-08 DIAGNOSIS — R7303 Prediabetes: Secondary | ICD-10-CM | POA: Diagnosis not present

## 2020-07-08 DIAGNOSIS — E559 Vitamin D deficiency, unspecified: Secondary | ICD-10-CM

## 2020-07-08 DIAGNOSIS — Z6841 Body Mass Index (BMI) 40.0 and over, adult: Secondary | ICD-10-CM

## 2020-07-08 MED ORDER — METFORMIN HCL 500 MG PO TABS: 500.0000 mg | ORAL_TABLET | Freq: Every day | ORAL | 0 refills | Status: DC

## 2020-07-16 ENCOUNTER — Other Ambulatory Visit (HOSPITAL_COMMUNITY)
Admission: RE | Admit: 2020-07-16 | Discharge: 2020-07-16 | Disposition: A | Discharge status: Home / Self Care | Payer: 59 | Source: Ambulatory Visit | Attending: Urgent Care | Admitting: Urgent Care

## 2020-07-16 ENCOUNTER — Other Ambulatory Visit: Payer: Self-pay | Admitting: Family Medicine

## 2020-07-16 DIAGNOSIS — Z124 Encounter for screening for malignant neoplasm of cervix: Secondary | ICD-10-CM | POA: Insufficient documentation

## 2020-07-20 LAB — CYTOLOGY - PAP
Adequacy: ABSENT
Comment: NEGATIVE
Diagnosis: NEGATIVE
High risk HPV: NEGATIVE

## 2020-07-21 ENCOUNTER — Ambulatory Visit (INDEPENDENT_AMBULATORY_CARE_PROVIDER_SITE_OTHER): Payer: 59 | Admitting: Bariatrics

## 2020-07-22 ENCOUNTER — Ambulatory Visit (INDEPENDENT_AMBULATORY_CARE_PROVIDER_SITE_OTHER): Payer: 59 | Admitting: Family Medicine

## 2020-07-26 ENCOUNTER — Other Ambulatory Visit (INDEPENDENT_AMBULATORY_CARE_PROVIDER_SITE_OTHER): Payer: Self-pay | Admitting: Family Medicine

## 2020-07-26 DIAGNOSIS — E8881 Metabolic syndrome: Secondary | ICD-10-CM

## 2020-07-27 NOTE — Telephone Encounter (Signed)
Dr.Ukleja 

## 2020-07-28 ENCOUNTER — Other Ambulatory Visit (INDEPENDENT_AMBULATORY_CARE_PROVIDER_SITE_OTHER): Payer: Self-pay

## 2020-07-28 DIAGNOSIS — E8881 Metabolic syndrome: Secondary | ICD-10-CM

## 2020-07-28 MED ORDER — OZEMPIC (1 MG/DOSE) 2 MG/1.5ML ~~LOC~~ SOPN: 1.0000 mg | PEN_INJECTOR | SUBCUTANEOUS | 0 refills | Status: DC

## 2020-08-02 NOTE — Progress Notes (Signed)
Chief Complaint:   OBESITY Carrie Buck is here to discuss her progress with her obesity treatment plan along with follow-up of her obesity related diagnoses. Carrie Buck is on the Category 3 Plan and the Vegetarian Plan and states she is following her eating plan approximately 75-80% of the time. Carrie Buck states she is not currently exercising.  Today's visit was #: 24 Starting weight: 260 lbs Starting date: 02/20/2018 Today's weight: 261 lbs Today's date: 07/08/2020 Total lbs lost to date: 0 Total lbs lost since last in-office visit: 0  Interim History: Carrie Buck has been working more over the last few weeks. She reports sleeping poorly and increased stress. She had a date for wings last night. 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 She has no plans until mid July. Pt hasn't started exercising yet. She has restarted taking lunches to work.   Subjective:   1. Pre-diabetes Carrie Buck's last A1c 5.2 and insulin level 23.7. She is on Metformin with no GI side effects.  2. Vitamin D deficiency Carrie Buck is on Vit D 10K IU PO 3 times a week. She denies nausea, vomiting, and muscle weakness but notes fatigue.   3. At risk for diabetes mellitus Carrie Buck is at higher than average risk for developing diabetes due to obesity.   Assessment/Plan:   1. Pre-diabetes Erandy will continue to work on weight loss, exercise, and decreasing simple carbohydrates to help decrease the risk of diabetes.   - metFORMIN (GLUCOPHAGE) 500 MG tablet; Take 1 tablet (500 mg total) by mouth daily with breakfast.  Dispense: 90 tablet; Refill: 0  2. Vitamin D deficiency Low Vitamin D level contributes to fatigue and are associated with obesity, breast, and colon cancer. She agrees to continue to take OTC Vitamin D @10 ,000 IU 3 times a week and will follow-up for routine testing of Vitamin D, at least 2-3 times per year to avoid over-replacement.  3. At risk for diabetes mellitus Carrie Buck was given approximately 15 minutes of  diabetes education and counseling today. We discussed intensive lifestyle modifications today with an emphasis on weight loss as well as increasing exercise and decreasing simple carbohydrates in her diet. We also reviewed medication options with an emphasis on risk versus benefit of those discussed.   Repetitive spaced learning was employed today to elicit superior memory formation and behavioral change.  4. Class 3 severe obesity with serious comorbidity and body mass index (BMI) of 50.0 to 59.9 in adult, unspecified obesity type (HCC)  Via is currently in the action stage of change. As such, her goal is to continue with weight loss efforts. She has agreed to the Category 3 Plan and the Vegetarian Plan.   Exercise goals: All adults should avoid inactivity. Some physical activity is better than none, and adults who participate in any amount of physical activity gain some health benefits.  Behavioral modification strategies: increasing lean protein intake, meal planning and cooking strategies, keeping healthy foods in the home, and planning for success.  Carrie Buck has agreed to follow-up with our clinic in 2-3 weeks. She was informed of the importance of frequent follow-up visits to maximize her success with intensive lifestyle modifications for her multiple health conditions.   Objective:   Blood pressure 125/80, pulse 70, temperature 98.4 F (36.9 C), height 5\' 6"  (1.676 m), weight 261 lb (118.4 kg), SpO2 98 %. Body mass index is 42.13 kg/m.  General: Cooperative, alert, well developed, in no acute distress. HEENT: Conjunctivae and lids unremarkable. Cardiovascular: Regular rhythm.  Lungs:  Normal work of breathing. Neurologic: No focal deficits.   Lab Results  Component Value Date   CREATININE 0.87 04/05/2020   BUN 11 04/05/2020   NA 138 04/05/2020   K 4.3 04/05/2020   CL 103 04/05/2020   CO2 20 04/05/2020   Lab Results  Component Value Date   ALT 16 04/05/2020   AST 15  04/05/2020   ALKPHOS 63 04/05/2020   BILITOT 0.4 04/05/2020   Lab Results  Component Value Date   HGBA1C 5.2 04/05/2020   HGBA1C 5.3 10/23/2019   HGBA1C 5.2 02/20/2018   Lab Results  Component Value Date   INSULIN 23.7 04/05/2020   INSULIN 16.9 10/23/2019   INSULIN 20.8 06/18/2019   INSULIN 15.3 02/20/2018   Lab Results  Component Value Date   TSH 2.680 02/20/2018   Lab Results  Component Value Date   CHOL 190 04/05/2020   HDL 52 04/05/2020   LDLCALC 108 (H) 04/05/2020   TRIG 171 (H) 04/05/2020   Lab Results  Component Value Date   WBC 7.6 06/18/2019   HGB 14.1 06/18/2019   HCT 41.9 06/18/2019   MCV 89 06/18/2019   PLT 252 06/18/2019   No results found for: IRON, TIBC, FERRITIN  Attestation Statements:   Reviewed by clinician on day of visit: allergies, medications, problem list, medical history, surgical history, family history, social history, and previous encounter notes.  Edmund Hilda, CMA, am acting as transcriptionist for Reuben Likes, MD.   I have reviewed the above documentation for accuracy and completeness, and I agree with the above. - Katherina Mires, MD

## 2020-08-05 ENCOUNTER — Ambulatory Visit (INDEPENDENT_AMBULATORY_CARE_PROVIDER_SITE_OTHER): Payer: 59 | Admitting: Family Medicine

## 2020-08-18 ENCOUNTER — Encounter (INDEPENDENT_AMBULATORY_CARE_PROVIDER_SITE_OTHER): Payer: Self-pay

## 2020-08-19 ENCOUNTER — Other Ambulatory Visit: Payer: Self-pay

## 2020-08-19 ENCOUNTER — Encounter (INDEPENDENT_AMBULATORY_CARE_PROVIDER_SITE_OTHER): Payer: Self-pay | Admitting: Family Medicine

## 2020-08-19 ENCOUNTER — Ambulatory Visit (INDEPENDENT_AMBULATORY_CARE_PROVIDER_SITE_OTHER): Payer: 59 | Admitting: Family Medicine

## 2020-08-19 VITALS — BP 125/84 | HR 93 | Temp 97.9°F | Ht 66.0 in | Wt 254.0 lb

## 2020-08-19 DIAGNOSIS — Z9189 Other specified personal risk factors, not elsewhere classified: Secondary | ICD-10-CM | POA: Diagnosis not present

## 2020-08-19 DIAGNOSIS — E8881 Metabolic syndrome: Secondary | ICD-10-CM

## 2020-08-19 DIAGNOSIS — Z6841 Body Mass Index (BMI) 40.0 and over, adult: Secondary | ICD-10-CM | POA: Diagnosis not present

## 2020-08-19 DIAGNOSIS — E782 Mixed hyperlipidemia: Secondary | ICD-10-CM | POA: Diagnosis not present

## 2020-08-19 MED ORDER — OZEMPIC (1 MG/DOSE) 2 MG/1.5ML ~~LOC~~ SOPN
1.0000 mg | PEN_INJECTOR | SUBCUTANEOUS | 0 refills | Status: DC
Start: 2020-08-19 — End: 2020-09-08

## 2020-08-26 NOTE — Progress Notes (Signed)
Chief Complaint:   OBESITY Carrie Buck is here to discuss her progress with her obesity treatment plan along with follow-up of her obesity related diagnoses. Carrie Buck is on the Category 3 Plan and the Vegetarian Plan and states she is following her eating plan approximately 90% of the time. Carrie Buck states she is waling and hiking 20-60 minutes 4 times per week.  Today's visit was #: 25 Starting weight: 260 lbs Starting date: 02/20/2018 Today's weight: 254 lbs Today's date: 08/19/2020 Total lbs lost to date: 6 Total lbs lost since last in-office visit: 7  Interim History: Carrie Buck has increased her walking. She is doing more meal prep and following plan more consistently. She is going to [place] tomorrow for the first time in over 25 years. She is on 1 mg Ozempic. She has quite a few upcoming plans.   Subjective:   1. Metabolic syndrome Carrie Buck has had only 1 episode of nausea after Ozempic shot. She is doing well with craving control.  2. Mixed hyperlipidemia Her LDL is 108, HDL 52, and triglycerides 833. She is not on a statin.  3. At risk for nausea Carrie Buck is at risk for nausea due to medication or diet and weight loss.  Assessment/Plan:   1. Metabolic syndrome Continue Ozempic at current dose.  Refill- Semaglutide, 1 MG/DOSE, (OZEMPIC, 1 MG/DOSE,) 2 MG/1.5ML SOPN; Inject 1 mg into the skin once a week.  Dispense: 1.5 mL; Refill: 0  2. Mixed hyperlipidemia Cardiovascular risk and specific lipid/LDL goals reviewed.  We discussed several lifestyle modifications today and Carrie Buck will continue to work on diet, exercise and weight loss efforts. Orders and follow up as documented in patient record. Repeat labs at next appt.  Counseling Intensive lifestyle modifications are the first line treatment for this issue. Dietary changes: Increase soluble fiber. Decrease simple carbohydrates. Exercise changes: Moderate to vigorous-intensity aerobic activity 150 minutes per week if  tolerated. Lipid-lowering medications: see documented in medical record.  3. At risk for nausea Carrie Buck was given approximately 15 minutes of nausea prevention counseling today. Carrie Buck is at risk for nausea due to her new or current medication. She was encouraged to titrate her medication slowly, make sure to stay hydrated, eat smaller portions throughout the day, and avoid high fat meals.    4. Obesity Current BMI 41.0  Carrie Buck is currently in the action stage of change. As such, her goal is to continue with weight loss efforts. She has agreed to the Category 3 Plan.   Exercise goals: All adults should avoid inactivity. Some physical activity is better than none, and adults who participate in any amount of physical activity gain some health benefits.  Behavioral modification strategies: increasing lean protein intake, meal planning and cooking strategies, keeping healthy foods in the home, and planning for success.  Carrie Buck has agreed to follow-up with our clinic in 3-4 weeks. She was informed of the importance of frequent follow-up visits to maximize her success with intensive lifestyle modifications for her multiple health conditions.   Objective:   Blood pressure 125/84, pulse 93, temperature 97.9 F (36.6 C), height 5\' 6"  (1.676 m), weight 254 lb (115.2 kg), SpO2 100 %. Body mass index is 41 kg/m.  General: Cooperative, alert, well developed, in no acute distress. HEENT: Conjunctivae and lids unremarkable. Cardiovascular: Regular rhythm.  Lungs: Normal work of breathing. Neurologic: No focal deficits.   Lab Results  Component Value Date   CREATININE 0.87 04/05/2020   BUN 11 04/05/2020   NA 138 04/05/2020  K 4.3 04/05/2020   CL 103 04/05/2020   CO2 20 04/05/2020   Lab Results  Component Value Date   ALT 16 04/05/2020   AST 15 04/05/2020   ALKPHOS 63 04/05/2020   BILITOT 0.4 04/05/2020   Lab Results  Component Value Date   HGBA1C 5.2 04/05/2020   HGBA1C  5.3 10/23/2019   HGBA1C 5.2 02/20/2018   Lab Results  Component Value Date   INSULIN 23.7 04/05/2020   INSULIN 16.9 10/23/2019   INSULIN 20.8 06/18/2019   INSULIN 15.3 02/20/2018   Lab Results  Component Value Date   TSH 2.680 02/20/2018   Lab Results  Component Value Date   CHOL 190 04/05/2020   HDL 52 04/05/2020   LDLCALC 108 (H) 04/05/2020   TRIG 171 (H) 04/05/2020   Lab Results  Component Value Date   VD25OH 50.3 04/05/2020   VD25OH 48.4 10/23/2019   VD25OH 31.2 02/20/2018   Lab Results  Component Value Date   WBC 7.6 06/18/2019   HGB 14.1 06/18/2019   HCT 41.9 06/18/2019   MCV 89 06/18/2019   PLT 252 06/18/2019   No results found for: IRON, TIBC, FERRITIN  Attestation Statements:   Reviewed by clinician on day of visit: allergies, medications, problem list, medical history, surgical history, family history, social history, and previous encounter notes.  Edmund Hilda, CMA, am acting as transcriptionist for Reuben Likes, MD.  I have reviewed the above documentation for accuracy and completeness, and I agree with the above. - Reuben Likes, MD

## 2020-09-08 ENCOUNTER — Other Ambulatory Visit: Payer: Self-pay

## 2020-09-08 ENCOUNTER — Other Ambulatory Visit: Payer: Self-pay | Admitting: Family Medicine

## 2020-09-08 ENCOUNTER — Encounter (INDEPENDENT_AMBULATORY_CARE_PROVIDER_SITE_OTHER): Payer: Self-pay | Admitting: Family Medicine

## 2020-09-08 ENCOUNTER — Ambulatory Visit (INDEPENDENT_AMBULATORY_CARE_PROVIDER_SITE_OTHER): Payer: 59 | Admitting: Family Medicine

## 2020-09-08 VITALS — BP 121/79 | HR 84 | Temp 97.4°F | Ht 66.0 in | Wt 253.0 lb

## 2020-09-08 DIAGNOSIS — Z1231 Encounter for screening mammogram for malignant neoplasm of breast: Secondary | ICD-10-CM

## 2020-09-08 DIAGNOSIS — Z6841 Body Mass Index (BMI) 40.0 and over, adult: Secondary | ICD-10-CM | POA: Diagnosis not present

## 2020-09-08 DIAGNOSIS — R7303 Prediabetes: Secondary | ICD-10-CM | POA: Diagnosis not present

## 2020-09-08 DIAGNOSIS — E8881 Metabolic syndrome: Secondary | ICD-10-CM

## 2020-09-08 DIAGNOSIS — Z9189 Other specified personal risk factors, not elsewhere classified: Secondary | ICD-10-CM

## 2020-09-08 MED ORDER — OZEMPIC (1 MG/DOSE) 2 MG/1.5ML ~~LOC~~ SOPN: 1.0000 mg | PEN_INJECTOR | SUBCUTANEOUS | 0 refills | Status: DC

## 2020-09-09 NOTE — Progress Notes (Signed)
Chief Complaint:   OBESITY Carrie Buck is here to discuss her progress with her obesity treatment plan along with follow-up of her obesity related diagnoses. Carrie Buck is on the Category 3 Plan and states she is following her eating plan approximately 30% of the time. Carrie Buck states she is doing 13,000 steps 4 times per week.  Today's visit was #: 26 Starting weight: 260 lbs Starting date: 02/20/2018 Today's weight: 253 lbs Today's date: 09/08/2020 Total lbs lost to date: 7 Total lbs lost since last in-office visit: 1  Interim History: Carrie Buck had a camping trip, COVID positive exposure, and then family came into town. She has her 40th birthday this month. She started meal prepping. Her kids go back to school August 29th. She realizes she may have taken in more alcohol than she anticipated.  Subjective:   1. Metabolic syndrome Carrie Buck is on 1 mg Ozempic with good control of eating. Her last LDL was 108.  2. Pre-diabetes Carrie Buck's last A1c was 5.2 and insulin level 23.7. She is on Metformin and GLP-1.  3. At risk of diabetes mellitus Carrie Buck is at higher than average risk for developing diabetes due to obesity.   Assessment/Plan:   1. Metabolic syndrome We will refill Ozempic 1 mg, as prescribed below.  Refill- Semaglutide, 1 MG/DOSE, (OZEMPIC, 1 MG/DOSE,) 2 MG/1.5ML SOPN; Inject 1 mg into the skin once a week.  Dispense: 1.5 mL; Refill: 0  2. Pre-diabetes Carrie Buck will continue to work on weight loss, exercise, and decreasing simple carbohydrates to help decrease the risk of diabetes. Continue current treatment plan.  3. At risk of diabetes mellitus Carrie Buck was given approximately 15 minutes of diabetes education and counseling today. We discussed intensive lifestyle modifications today with an emphasis on weight loss as well as increasing exercise and decreasing simple carbohydrates in her diet. We also reviewed medication options with an emphasis on risk versus benefit of those  discussed.   Repetitive spaced learning was employed today to elicit superior memory formation and behavioral change.  4. Obesity with current BMI of 40.9  Carrie Buck is currently in the action stage of change. As such, her goal is to continue with weight loss efforts. She has agreed to the Category 3 Plan.   Exercise goals:  As is  Behavioral modification strategies: increasing lean protein intake, meal planning and cooking strategies, keeping healthy foods in the home, and planning for success.  Carrie Buck has agreed to follow-up with our clinic in 2-3 weeks. She was informed of the importance of frequent follow-up visits to maximize her success with intensive lifestyle modifications for her multiple health conditions.   Objective:   Blood pressure 121/79, pulse 84, temperature (!) 97.4 F (36.3 C), height 5\' 6"  (1.676 m), weight 253 lb (114.8 kg), last menstrual period 08/01/2020, SpO2 98 %. Body mass index is 40.84 kg/m.  General: Cooperative, alert, well developed, in no acute distress. HEENT: Conjunctivae and lids unremarkable. Cardiovascular: Regular rhythm.  Lungs: Normal work of breathing. Neurologic: No focal deficits.   Lab Results  Component Value Date   CREATININE 0.87 04/05/2020   BUN 11 04/05/2020   NA 138 04/05/2020   K 4.3 04/05/2020   CL 103 04/05/2020   CO2 20 04/05/2020   Lab Results  Component Value Date   ALT 16 04/05/2020   AST 15 04/05/2020   ALKPHOS 63 04/05/2020   BILITOT 0.4 04/05/2020   Lab Results  Component Value Date   HGBA1C 5.2 04/05/2020   HGBA1C 5.3 10/23/2019  HGBA1C 5.2 02/20/2018   Lab Results  Component Value Date   INSULIN 23.7 04/05/2020   INSULIN 16.9 10/23/2019   INSULIN 20.8 06/18/2019   INSULIN 15.3 02/20/2018   Lab Results  Component Value Date   TSH 2.680 02/20/2018   Lab Results  Component Value Date   CHOL 190 04/05/2020   HDL 52 04/05/2020   LDLCALC 108 (H) 04/05/2020   TRIG 171 (H) 04/05/2020   Lab  Results  Component Value Date   VD25OH 50.3 04/05/2020   VD25OH 48.4 10/23/2019   VD25OH 31.2 02/20/2018   Lab Results  Component Value Date   WBC 7.6 06/18/2019   HGB 14.1 06/18/2019   HCT 41.9 06/18/2019   MCV 89 06/18/2019   PLT 252 06/18/2019    Attestation Statements:   Reviewed by clinician on day of visit: allergies, medications, problem list, medical history, surgical history, family history, social history, and previous encounter notes.  Edmund Hilda, CMA, am acting as transcriptionist for Reuben Likes, MD.   I have reviewed the above documentation for accuracy and completeness, and I agree with the above. - Reuben Likes, MD

## 2020-09-16 ENCOUNTER — Other Ambulatory Visit (INDEPENDENT_AMBULATORY_CARE_PROVIDER_SITE_OTHER): Payer: Self-pay | Admitting: Family Medicine

## 2020-09-16 DIAGNOSIS — E8881 Metabolic syndrome: Secondary | ICD-10-CM

## 2020-10-04 ENCOUNTER — Other Ambulatory Visit: Payer: Self-pay

## 2020-10-04 ENCOUNTER — Encounter (INDEPENDENT_AMBULATORY_CARE_PROVIDER_SITE_OTHER): Payer: Self-pay | Admitting: Family Medicine

## 2020-10-04 ENCOUNTER — Other Ambulatory Visit (INDEPENDENT_AMBULATORY_CARE_PROVIDER_SITE_OTHER): Payer: Self-pay | Admitting: Family Medicine

## 2020-10-04 ENCOUNTER — Ambulatory Visit (INDEPENDENT_AMBULATORY_CARE_PROVIDER_SITE_OTHER): Payer: 59 | Admitting: Family Medicine

## 2020-10-04 VITALS — BP 112/79 | HR 81 | Temp 97.6°F | Ht 66.0 in | Wt 257.0 lb

## 2020-10-04 DIAGNOSIS — E559 Vitamin D deficiency, unspecified: Secondary | ICD-10-CM

## 2020-10-04 DIAGNOSIS — E8881 Metabolic syndrome: Secondary | ICD-10-CM

## 2020-10-04 DIAGNOSIS — Z6841 Body Mass Index (BMI) 40.0 and over, adult: Secondary | ICD-10-CM

## 2020-10-04 DIAGNOSIS — Z9189 Other specified personal risk factors, not elsewhere classified: Secondary | ICD-10-CM

## 2020-10-04 MED ORDER — SEMAGLUTIDE (2 MG/DOSE) 8 MG/3ML ~~LOC~~ SOPN: 2.0000 mg | PEN_INJECTOR | SUBCUTANEOUS | 0 refills | Status: DC

## 2020-10-04 NOTE — Progress Notes (Signed)
Chief Complaint:   OBESITY Carrie Buck is here to discuss her progress with her obesity treatment plan along with follow-up of her obesity related diagnoses. Carrie Buck is on the Category 3 Plan and states she is following her eating plan approximately 60-70% of the time. Carrie Buck states she is doing yard work.  Today's visit was #: 27 Starting weight: 260 lbs Starting date: 02/20/2018 Today's weight: 257 lbs Today's date: 10/04/2020 Total lbs lost to date: 3 Total lbs lost since last in-office visit: 0  Interim History: Carrie Buck has been doing some landscaping in her front yard. She had a birthday since her last appt. She went downtown to [employer] and [employer]. Food wise, she has had more alcohol intake than previously. She has gotten more off plan than she anticipated. Pt is going to a conference for 1 week. In 3 weeks, she is going to [place] for 5 days.  Subjective:   1. Metabolic syndrome Carrie Buck is on Ozempic 1 mg with some control of eating. She denies GI side effects.  2. Vitamin D deficiency Pt denies nausea, vomiting, and muscle weakness but notes fatigue. She is on OTC Vit D 10,000 IU 3 times a week.  3. At risk for side effect of medication Carrie Buck is at risk for side effects of medication due to increasing Ozempic.  Assessment/Plan:   1. Metabolic syndrome Increase Ozempic to 2 mg dose weekly.  Increase- Semaglutide, 2 MG/DOSE, 8 MG/3ML SOPN; Inject 2 mg as directed once a week.  Dispense: 3 mL; Refill: 0  2. Vitamin D deficiency Low Vitamin D level contributes to fatigue and are associated with obesity, breast, and colon cancer. She agrees to continue to take OTC Vitamin D 10,000 IU 3 times week and will follow-up for routine testing of Vitamin D, at least 2-3 times per year to avoid over-replacement. Check Vit D level at next appt.  3. At risk for side effect of medication Carrie Buck was given approximately 15 minutes of drug side effect counseling today.  We discussed  side effect possibility and risk versus benefits. Carrie Buck agreed to the medication and will contact this office if these side effects are intolerable.  Repetitive spaced learning was employed today to elicit superior memory formation and behavioral change.  4. Obesity with current BMI of 41.6  Carrie Buck is currently in the action stage of change. As such, her goal is to continue with weight loss efforts. She has agreed to the Category 3 Plan.   Exercise goals: No exercise has been prescribed at this time.  Behavioral modification strategies: increasing lean protein intake, meal planning and cooking strategies, keeping healthy foods in the home, and planning for success.  Carrie Buck has agreed to follow-up with our clinic in 2 weeks. She was informed of the importance of frequent follow-up visits to maximize her success with intensive lifestyle modifications for her multiple health conditions.   Objective:   Blood pressure 112/79, pulse 81, temperature 97.6 F (36.4 C), height 5\' 6"  (1.676 m), weight 257 lb (116.6 kg), last menstrual period 09/27/2020, SpO2 98 %. Body mass index is 41.48 kg/m.  General: Cooperative, alert, well developed, in no acute distress. HEENT: Conjunctivae and lids unremarkable. Cardiovascular: Regular rhythm.  Lungs: Normal work of breathing. Neurologic: No focal deficits.   Lab Results  Component Value Date   CREATININE 0.87 04/05/2020   BUN 11 04/05/2020   NA 138 04/05/2020   K 4.3 04/05/2020   CL 103 04/05/2020   CO2 20 04/05/2020  Lab Results  Component Value Date   ALT 16 04/05/2020   AST 15 04/05/2020   ALKPHOS 63 04/05/2020   BILITOT 0.4 04/05/2020   Lab Results  Component Value Date   HGBA1C 5.2 04/05/2020   HGBA1C 5.3 10/23/2019   HGBA1C 5.2 02/20/2018   Lab Results  Component Value Date   INSULIN 23.7 04/05/2020   INSULIN 16.9 10/23/2019   INSULIN 20.8 06/18/2019   INSULIN 15.3 02/20/2018   Lab Results  Component Value Date   TSH  2.680 02/20/2018   Lab Results  Component Value Date   CHOL 190 04/05/2020   HDL 52 04/05/2020   LDLCALC 108 (H) 04/05/2020   TRIG 171 (H) 04/05/2020   Lab Results  Component Value Date   VD25OH 50.3 04/05/2020   VD25OH 48.4 10/23/2019   VD25OH 31.2 02/20/2018   Lab Results  Component Value Date   WBC 7.6 06/18/2019   HGB 14.1 06/18/2019   HCT 41.9 06/18/2019   MCV 89 06/18/2019   PLT 252 06/18/2019   Attestation Statements:   Reviewed by clinician on day of visit: allergies, medications, problem list, medical history, surgical history, family history, social history, and previous encounter notes.  Edmund Hilda, CMA, am acting as transcriptionist for Reuben Likes, MD.  I have reviewed the above documentation for accuracy and completeness, and I agree with the above. - Reuben Likes, MD

## 2020-10-12 ENCOUNTER — Encounter (INDEPENDENT_AMBULATORY_CARE_PROVIDER_SITE_OTHER): Payer: Self-pay

## 2020-10-12 ENCOUNTER — Other Ambulatory Visit (INDEPENDENT_AMBULATORY_CARE_PROVIDER_SITE_OTHER): Payer: Self-pay | Admitting: Family Medicine

## 2020-10-12 DIAGNOSIS — R7303 Prediabetes: Secondary | ICD-10-CM

## 2020-10-12 NOTE — Telephone Encounter (Signed)
Message sent to pt-CAS 

## 2020-10-18 ENCOUNTER — Encounter (INDEPENDENT_AMBULATORY_CARE_PROVIDER_SITE_OTHER): Payer: Self-pay

## 2020-10-19 ENCOUNTER — Ambulatory Visit (INDEPENDENT_AMBULATORY_CARE_PROVIDER_SITE_OTHER): Payer: 59 | Admitting: Family Medicine

## 2020-10-19 ENCOUNTER — Other Ambulatory Visit: Payer: Self-pay

## 2020-10-19 ENCOUNTER — Encounter (INDEPENDENT_AMBULATORY_CARE_PROVIDER_SITE_OTHER): Payer: Self-pay | Admitting: Family Medicine

## 2020-10-19 VITALS — BP 108/76 | HR 79 | Temp 97.9°F | Ht 66.0 in | Wt 256.0 lb

## 2020-10-19 DIAGNOSIS — E559 Vitamin D deficiency, unspecified: Secondary | ICD-10-CM

## 2020-10-19 DIAGNOSIS — E8881 Metabolic syndrome: Secondary | ICD-10-CM | POA: Diagnosis not present

## 2020-10-19 DIAGNOSIS — R7303 Prediabetes: Secondary | ICD-10-CM | POA: Diagnosis not present

## 2020-10-19 DIAGNOSIS — Z9189 Other specified personal risk factors, not elsewhere classified: Secondary | ICD-10-CM | POA: Diagnosis not present

## 2020-10-19 DIAGNOSIS — Z6841 Body Mass Index (BMI) 40.0 and over, adult: Secondary | ICD-10-CM

## 2020-10-19 MED ORDER — SEMAGLUTIDE (1 MG/DOSE) 4 MG/3ML ~~LOC~~ SOPN: 2.0000 mg | PEN_INJECTOR | SUBCUTANEOUS | 0 refills | Status: DC

## 2020-10-19 MED ORDER — METFORMIN HCL 500 MG PO TABS: 500.0000 mg | ORAL_TABLET | Freq: Every day | ORAL | 0 refills | Status: DC

## 2020-10-19 NOTE — Progress Notes (Signed)
Chief Complaint:   OBESITY Carrie Buck is here to discuss her progress with her obesity treatment plan along with follow-up of her obesity related diagnoses. Carrie Buck is on the Category 3 Plan and states she is following her eating plan approximately 70-80% of the time. Carrie Buck states she is walking and doing resistance training 20-45 minutes 2-3 times per week.  Today's visit was #: 28 Starting weight: 260 lbs Starting date: 02/20/2018 Today's weight: 256 lbs Today's date: 10/19/2020 Total lbs lost to date: 4 Total lbs lost since last in-office visit: 1  Interim History: Carrie Buck is leaving for [place] in one week and is having some angst with everything left to do prior to her trip. Food wise, she is getting back on plan better- dinner is the easiest. Lunch is often salad and breakfast is hit or miss. She thinks she isn't getting all protein in at lunch, but is trying to make up for it at dinner.  Subjective:   1. Metabolic syndrome Carrie Buck is on Ozempic- couldn't get 2 mg dose due to shortage. She wants to get back on 1 mg dose.  2. Pre-diabetes Pt's last A1c was 5.2 and insulin level 23.7. She is on Metformin with no GI side effects.  3. Vitamin D deficiency Carrie Buck is on OTC Vit D. Pt denies nausea, vomiting, and muscle weakness but notes fatigue.   4. At increased risk of exposure to COVID-19 virus Carrie Buck is at increased risk of exposure to COVID-19 virus due to traveling internationally.   Assessment/Plan:   1. Metabolic syndrome Starting goal: Lose 7-10% of starting weight. She will continue to focus on protein-rich, low simple carbohydrate foods. We reviewed the importance of hydration, regular exercise for stress reduction, and restorative sleep.  We will continue to check lab work every 3 months, with 10% weight loss, or should any other concerns arise. Check FLP today.  Refill- Semaglutide, 1 MG/DOSE, 4 MG/3ML SOPN; Inject 2 mg as directed once a week.  Dispense: 6 mL;  Refill: 0  - Lipid Panel With LDL/HDL Ratio  2. Pre-diabetes Carrie Buck will continue to work on weight loss, exercise, and decreasing simple carbohydrates to help decrease the risk of diabetes. Check labs today.  Refill- metFORMIN (GLUCOPHAGE) 500 MG tablet; Take 1 tablet (500 mg total) by mouth daily with breakfast.  Dispense: 90 tablet; Refill: 0  - Comprehensive metabolic panel - Hemoglobin A1c - Insulin, random  3. Vitamin D deficiency Low Vitamin D level contributes to fatigue and are associated with obesity, breast, and colon cancer. She agrees to continue to take OTC Vitamin D daily and will follow-up for routine testing of Vitamin D, at least 2-3 times per year to avoid over-replacement.  - VITAMIN D 25 Hydroxy (Vit-D Deficiency, Fractures)  4. At increased risk of exposure to COVID-19 virus Carrie Buck was given approximately 15 minutes of COVID prevention counseling today Counseling COVID-19 is a respiratory infection that is caused by a virus. It can cause serious infections, such as pneumonia, acute respiratory distress syndrome, acute respiratory failure, or sepsis. You are more likely to develop a serious illness if you are 38 years of age or older, have a weak immune system, live in a nursing home, have chronic disease, or have obesity. Get vaccinated as soon as they are available to you.  For our most current information, please visit [email or web address] Wash your hands often with soap and water for 20 seconds. If soap and water are not available, use alcohol-based hand sanitizer. Wear a  face mask. Make sure your mask covers your nose and mouth. Maintain at least 6 feet distance from others when in public.  Get help right away if You have trouble breathing, chest pain, confusion, or other concerning symptoms.  Repetitive spaced learning was employed today to elicit superior memory formation and behavioral change.  5. Obesity with current BMI of 41.4  Carrie Buck is  currently in the action stage of change. As such, her goal is to continue with weight loss efforts. She has agreed to the Category 3 Plan.   Exercise goals:  As is  Behavioral modification strategies: increasing lean protein intake, meal planning and cooking strategies, keeping healthy foods in the home, and travel eating strategies.  Carrie Buck has agreed to follow-up with our clinic in 3 weeks. She was informed of the importance of frequent follow-up visits to maximize her success with intensive lifestyle modifications for her multiple health conditions.   Carrie Buck was informed we would discuss her lab results at her next visit unless there is a critical issue that needs to be addressed sooner. Carrie Buck agreed to keep her next visit at the agreed upon time to discuss these results.  Objective:   Blood pressure 108/76, pulse 79, temperature 97.9 F (36.6 C), height 5\' 6"  (1.676 m), weight 256 lb (116.1 kg), last menstrual period 09/27/2020, SpO2 98 %. Body mass index is 41.32 kg/m.  General: Cooperative, alert, well developed, in no acute distress. HEENT: Conjunctivae and lids unremarkable. Cardiovascular: Regular rhythm.  Lungs: Normal work of breathing. Neurologic: No focal deficits.   Lab Results  Component Value Date   CREATININE 0.87 04/05/2020   BUN 11 04/05/2020   NA 138 04/05/2020   K 4.3 04/05/2020   CL 103 04/05/2020   CO2 20 04/05/2020   Lab Results  Component Value Date   ALT 16 04/05/2020   AST 15 04/05/2020   ALKPHOS 63 04/05/2020   BILITOT 0.4 04/05/2020   Lab Results  Component Value Date   HGBA1C 5.2 04/05/2020   HGBA1C 5.3 10/23/2019   HGBA1C 5.2 02/20/2018   Lab Results  Component Value Date   INSULIN 23.7 04/05/2020   INSULIN 16.9 10/23/2019   INSULIN 20.8 06/18/2019   INSULIN 15.3 02/20/2018   Lab Results  Component Value Date   TSH 2.680 02/20/2018   Lab Results  Component Value Date   CHOL 190 04/05/2020   HDL 52 04/05/2020   LDLCALC 108  (H) 04/05/2020   TRIG 171 (H) 04/05/2020   Lab Results  Component Value Date   VD25OH 50.3 04/05/2020   VD25OH 48.4 10/23/2019   VD25OH 31.2 02/20/2018   Lab Results  Component Value Date   WBC 7.6 06/18/2019   HGB 14.1 06/18/2019   HCT 41.9 06/18/2019   MCV 89 06/18/2019   PLT 252 06/18/2019   Attestation Statements:   Reviewed by clinician on day of visit: allergies, medications, problem list, medical history, surgical history, family history, social history, and previous encounter notes.  08/18/2019, CMA, am acting as transcriptionist for Edmund Hilda, MD.  I have reviewed the above documentation for accuracy and completeness, and I agree with the above. - Reuben Likes, MD

## 2020-10-20 LAB — COMPREHENSIVE METABOLIC PANEL
ALT: 13 IU/L (ref 0–32)
AST: 15 IU/L (ref 0–40)
Albumin/Globulin Ratio: 1.6 (ref 1.2–2.2)
Albumin: 4.4 g/dL (ref 3.8–4.8)
Alkaline Phosphatase: 57 IU/L (ref 44–121)
BUN/Creatinine Ratio: 13 (ref 9–23)
BUN: 11 mg/dL (ref 6–24)
Bilirubin Total: 0.4 mg/dL (ref 0.0–1.2)
CO2: 23 mmol/L (ref 20–29)
Calcium: 9.4 mg/dL (ref 8.7–10.2)
Chloride: 101 mmol/L (ref 96–106)
Creatinine, Ser: 0.85 mg/dL (ref 0.57–1.00)
Globulin, Total: 2.8 g/dL (ref 1.5–4.5)
Glucose: 85 mg/dL (ref 65–99)
Potassium: 4 mmol/L (ref 3.5–5.2)
Sodium: 139 mmol/L (ref 134–144)
Total Protein: 7.2 g/dL (ref 6.0–8.5)
eGFR: 89 mL/min/{1.73_m2} (ref >59)

## 2020-10-20 LAB — LIPID PANEL WITH LDL/HDL RATIO
Cholesterol, Total: 192 mg/dL (ref 100–199)
HDL: 60 mg/dL (ref >39)
LDL Chol Calc (NIH): 109 mg/dL — ABNORMAL HIGH (ref 0–99)
LDL/HDL Ratio: 1.8 ratio (ref 0.0–3.2)
Triglycerides: 130 mg/dL (ref 0–149)
VLDL Cholesterol Cal: 23 mg/dL (ref 5–40)

## 2020-10-20 LAB — HEMOGLOBIN A1C
Est. average glucose Bld gHb Est-mCnc: 108 mg/dL
Hgb A1c MFr Bld: 5.4 % (ref 4.8–5.6)

## 2020-10-20 LAB — VITAMIN D 25 HYDROXY (VIT D DEFICIENCY, FRACTURES): Vit D, 25-Hydroxy: 55.9 ng/mL (ref 30.0–100.0)

## 2020-10-20 LAB — INSULIN, RANDOM: INSULIN: 17 u[IU]/mL (ref 2.6–24.9)

## 2020-11-04 ENCOUNTER — Ambulatory Visit
Admission: RE | Admit: 2020-11-04 | Discharge: 2020-11-04 | Disposition: A | Discharge status: Home / Self Care | Payer: 59 | Source: Ambulatory Visit | Attending: Family Medicine | Admitting: Family Medicine

## 2020-11-04 ENCOUNTER — Other Ambulatory Visit: Payer: Self-pay

## 2020-11-04 DIAGNOSIS — Z1231 Encounter for screening mammogram for malignant neoplasm of breast: Secondary | ICD-10-CM

## 2020-11-10 ENCOUNTER — Encounter (INDEPENDENT_AMBULATORY_CARE_PROVIDER_SITE_OTHER): Payer: Self-pay | Admitting: Family Medicine

## 2020-11-10 ENCOUNTER — Other Ambulatory Visit: Payer: Self-pay

## 2020-11-10 ENCOUNTER — Ambulatory Visit (INDEPENDENT_AMBULATORY_CARE_PROVIDER_SITE_OTHER): Payer: 59 | Admitting: Family Medicine

## 2020-11-10 VITALS — BP 133/85 | HR 81 | Temp 98.2°F | Ht 66.0 in | Wt 258.0 lb

## 2020-11-10 DIAGNOSIS — E7849 Other hyperlipidemia: Secondary | ICD-10-CM | POA: Diagnosis not present

## 2020-11-10 DIAGNOSIS — Z6841 Body Mass Index (BMI) 40.0 and over, adult: Secondary | ICD-10-CM

## 2020-11-10 DIAGNOSIS — Z9189 Other specified personal risk factors, not elsewhere classified: Secondary | ICD-10-CM | POA: Diagnosis not present

## 2020-11-10 DIAGNOSIS — E8881 Metabolic syndrome: Secondary | ICD-10-CM | POA: Diagnosis not present

## 2020-11-10 DIAGNOSIS — E66813 Obesity, class 3: Secondary | ICD-10-CM

## 2020-11-10 MED ORDER — SEMAGLUTIDE (1 MG/DOSE) 4 MG/3ML ~~LOC~~ SOPN: 2.0000 mg | PEN_INJECTOR | SUBCUTANEOUS | 0 refills | Status: DC

## 2020-11-10 NOTE — Progress Notes (Signed)
Chief Complaint:   OBESITY Carrie Buck is here to discuss her progress with her obesity treatment plan along with follow-up of her obesity related diagnoses. Carrie Buck is on the Category 3 Plan and states she is following her eating plan approximately 70% of the time. Carrie Buck states she was in [place] and walked 45 miles while there.  Today's visit was #: 29 Starting weight: 260 lbs Starting date: 02/20/2018 Today's weight: 258 lbs Today's date: 11/10/2020 Total lbs lost to date: 2 Total lbs lost since last in-office visit: 0  Interim History: Carrie Buck just returned from a trip to [place] and enjoyed herself thoroughly. She walked everywhere and got 7-8 miles/day with 51 flights of steps daily. She returned on September 26th and got back on meal plan. She hurt her hand and is awaiting ortho evaluation on October 18.  Subjective:   1. Metabolic syndrome Carrie Buck is on GLP-1 (has to break up 2 mg dose into two 1 mg doses) and denies side effects of Ozempic.  2. Other hyperlipidemia Carrie Buck is not on statin therapy.  3. At risk of diabetes mellitus Carrie Buck is at higher than average risk for developing diabetes due to obesity.   Assessment/Plan:   1. Metabolic syndrome Continue Ozempic 2 mg as prescribed.  Refill- Semaglutide, 1 MG/DOSE, 4 MG/3ML SOPN; Inject 2 mg as directed once a week.  Dispense: 12 mL; Refill: 0  2. Other hyperlipidemia Cardiovascular risk and specific lipid/LDL goals reviewed.  We discussed several lifestyle modifications today and Carrie Buck will continue to work on diet, exercise and weight loss efforts. Orders and follow up as documented in patient record. Follow up labs in November 2022.  Counseling Intensive lifestyle modifications are the first line treatment for this issue. Dietary changes: Increase soluble fiber. Decrease simple carbohydrates. Exercise changes: Moderate to vigorous-intensity aerobic activity 150 minutes per week if tolerated. Lipid-lowering  medications: see documented in medical record.  3. At risk of diabetes mellitus Carrie Buck was given approximately 15 minutes of diabetes education and counseling today. We discussed intensive lifestyle modifications today with an emphasis on weight loss as well as increasing exercise and decreasing simple carbohydrates in her diet. We also reviewed medication options with an emphasis on risk versus benefit of those discussed.   Repetitive spaced learning was employed today to elicit superior memory formation and behavioral change.  4. Obesity with current BMI of 41.8  Carrie Buck is currently in the action stage of change. As such, her goal is to continue with weight loss efforts. She has agreed to the Category 3 Plan.   Exercise goals: All adults should avoid inactivity. Some physical activity is better than none, and adults who participate in any amount of physical activity gain some health benefits. Pt wants to continue walking.  Behavioral modification strategies: increasing lean protein intake, meal planning and cooking strategies, and keeping healthy foods in the home.  Carrie Buck has agreed to follow-up with our clinic in 3 weeks. She was informed of the importance of frequent follow-up visits to maximize her success with intensive lifestyle modifications for her multiple health conditions.   Objective:   Blood pressure 133/85, pulse 81, temperature 98.2 F (36.8 C), height 5\' 6"  (1.676 m), weight 258 lb (117 kg), last menstrual period 10/25/2020, SpO2 97 %. Body mass index is 41.64 kg/m.  General: Cooperative, alert, well developed, in no acute distress. HEENT: Conjunctivae and lids unremarkable. Cardiovascular: Regular rhythm.  Lungs: Normal work of breathing. Neurologic: No focal deficits.   Lab Results  Component Value Date   CREATININE 0.85 10/19/2020   BUN 11 10/19/2020   NA 139 10/19/2020   K 4.0 10/19/2020   CL 101 10/19/2020   CO2 23 10/19/2020   Lab Results  Component  Value Date   ALT 13 10/19/2020   AST 15 10/19/2020   ALKPHOS 57 10/19/2020   BILITOT 0.4 10/19/2020   Lab Results  Component Value Date   HGBA1C 5.4 10/19/2020   HGBA1C 5.2 04/05/2020   HGBA1C 5.3 10/23/2019   HGBA1C 5.2 02/20/2018   Lab Results  Component Value Date   INSULIN 17.0 10/19/2020   INSULIN 23.7 04/05/2020   INSULIN 16.9 10/23/2019   INSULIN 20.8 06/18/2019   INSULIN 15.3 02/20/2018   Lab Results  Component Value Date   TSH 2.680 02/20/2018   Lab Results  Component Value Date   CHOL 192 10/19/2020   HDL 60 10/19/2020   LDLCALC 109 (H) 10/19/2020   TRIG 130 10/19/2020   Lab Results  Component Value Date   VD25OH 55.9 10/19/2020   VD25OH 50.3 04/05/2020   VD25OH 48.4 10/23/2019   Lab Results  Component Value Date   WBC 7.6 06/18/2019   HGB 14.1 06/18/2019   HCT 41.9 06/18/2019   MCV 89 06/18/2019   PLT 252 06/18/2019    Attestation Statements:   Reviewed by clinician on day of visit: allergies, medications, problem list, medical history, surgical history, family history, social history, and previous encounter notes.  Edmund Hilda, CMA, am acting as transcriptionist for Reuben Likes, MD.   I have reviewed the above documentation for accuracy and completeness, and I agree with the above. - Reuben Likes, MD

## 2020-12-02 ENCOUNTER — Other Ambulatory Visit: Payer: Self-pay

## 2020-12-02 ENCOUNTER — Encounter (INDEPENDENT_AMBULATORY_CARE_PROVIDER_SITE_OTHER): Payer: Self-pay | Admitting: Family Medicine

## 2020-12-02 ENCOUNTER — Telehealth (INDEPENDENT_AMBULATORY_CARE_PROVIDER_SITE_OTHER): Payer: 59 | Admitting: Family Medicine

## 2020-12-02 DIAGNOSIS — E7849 Other hyperlipidemia: Secondary | ICD-10-CM

## 2020-12-02 DIAGNOSIS — E8881 Metabolic syndrome: Secondary | ICD-10-CM

## 2020-12-02 MED ORDER — SEMAGLUTIDE (1 MG/DOSE) 4 MG/3ML ~~LOC~~ SOPN: 2.0000 mg | PEN_INJECTOR | SUBCUTANEOUS | 0 refills | Status: DC

## 2020-12-02 NOTE — Progress Notes (Signed)
TeleHealth Visit:  Due to the COVID-19 pandemic, this visit was completed with telemedicine (audio/video) technology to reduce patient and provider exposure as well as to preserve personal protective equipment.   Carrie Buck has verbally consented to this TeleHealth visit. The patient is located at home, the provider is located at the [facility] office. The participants in this visit include the listed provider and patient. The visit was conducted today via video.   Chief Complaint: OBESITY Carrie Buck is here to discuss her progress with her obesity treatment plan along with follow-up of her obesity related diagnoses. Carrie Buck is on the Category 3 Plan and states she is following her eating plan approximately 80% of the time. Carrie Buck states she is hiking and walking 120 minutes 2 times per week.  Today's visit was #: 30 Starting weight: 260 lbs Starting date: 02/20/2018  Interim History: Carrie Buck's daughter is currently sick with URI symptoms but her COVID test is negative. Pt is currently dealing with a headache and fatigue. Her biggest obstacle is her daughter's birthday party this weekend. She wants to really commit to the meal plan over the holidays. Pt reports a weight of 255.4 this morning.  Subjective:   1. Metabolic syndrome Carrie Buck is on Ozempic 2 mg and is doing well on this dose.  2. Other hyperlipidemia Pt's last LDL was 109, HDL 60, and triglycerides 867.  Assessment/Plan:   1. Metabolic syndrome Continue Ozempic 2 mg as directed.  Refill- Semaglutide, 1 MG/DOSE, 4 MG/3ML SOPN; Inject 2 mg as directed once a week.  Dispense: 12 mL; Refill: 0  2. Other hyperlipidemia Cardiovascular risk and specific lipid/LDL goals reviewed.  We discussed several lifestyle modifications today and Darely will continue to work on diet, exercise and weight loss efforts. Orders and follow up as documented in patient record. Follow up labs in February 2023.  Counseling Intensive  lifestyle modifications are the first line treatment for this issue. Dietary changes: Increase soluble fiber. Decrease simple carbohydrates. Exercise changes: Moderate to vigorous-intensity aerobic activity 150 minutes per week if tolerated. Lipid-lowering medications: see documented in medical record.  3. Obesity with current BMI of 41.8  Carrie Buck is currently in the action stage of change. As such, her goal is to continue with weight loss efforts. She has agreed to the Category 3 Plan.   Exercise goals:  As is  Behavioral modification strategies: increasing lean protein intake, meal planning and cooking strategies, keeping healthy foods in the home, and planning for success.  Carrie Buck has agreed to follow-up with our clinic in 3 weeks. She was informed of the importance of frequent follow-up visits to maximize her success with intensive lifestyle modifications for her multiple health conditions.  Objective:   VITALS: Per patient if applicable, see vitals. GENERAL: Alert and in no acute distress. CARDIOPULMONARY: No increased WOB. Speaking in clear sentences.  PSYCH: Pleasant and cooperative. Speech normal rate and rhythm. Affect is appropriate. Insight and judgement are appropriate. Attention is focused, linear, and appropriate.  NEURO: Oriented as arrived to appointment on time with no prompting.   Lab Results  Component Value Date   CREATININE 0.85 10/19/2020   BUN 11 10/19/2020   NA 139 10/19/2020   K 4.0 10/19/2020   CL 101 10/19/2020   CO2 23 10/19/2020   Lab Results  Component Value Date   ALT 13 10/19/2020   AST 15 10/19/2020   ALKPHOS 57 10/19/2020   BILITOT 0.4 10/19/2020   Lab Results  Component Value Date  HGBA1C 5.4 10/19/2020   HGBA1C 5.2 04/05/2020   HGBA1C 5.3 10/23/2019   HGBA1C 5.2 02/20/2018   Lab Results  Component Value Date   INSULIN 17.0 10/19/2020   INSULIN 23.7 04/05/2020   INSULIN 16.9 10/23/2019   INSULIN 20.8 06/18/2019   INSULIN 15.3  02/20/2018   Lab Results  Component Value Date   TSH 2.680 02/20/2018   Lab Results  Component Value Date   CHOL 192 10/19/2020   HDL 60 10/19/2020   LDLCALC 109 (H) 10/19/2020   TRIG 130 10/19/2020   Lab Results  Component Value Date   VD25OH 55.9 10/19/2020   VD25OH 50.3 04/05/2020   VD25OH 48.4 10/23/2019   Lab Results  Component Value Date   WBC 7.6 06/18/2019   HGB 14.1 06/18/2019   HCT 41.9 06/18/2019   MCV 89 06/18/2019   PLT 252 06/18/2019   No results found for: IRON, TIBC, FERRITIN  Attestation Statements:   Reviewed by clinician on day of visit: allergies, medications, problem list, medical history, surgical history, family history, social history, and previous encounter notes.  Edmund Hilda, CMA, am acting as transcriptionist for Reuben Likes, MD.  I have reviewed the above documentation for accuracy and completeness, and I agree with the above. - Reuben Likes, MD

## 2020-12-22 ENCOUNTER — Other Ambulatory Visit: Payer: Self-pay

## 2020-12-22 ENCOUNTER — Ambulatory Visit (INDEPENDENT_AMBULATORY_CARE_PROVIDER_SITE_OTHER): Payer: 59 | Admitting: Family Medicine

## 2020-12-22 ENCOUNTER — Encounter (INDEPENDENT_AMBULATORY_CARE_PROVIDER_SITE_OTHER): Payer: Self-pay | Admitting: Family Medicine

## 2020-12-22 VITALS — BP 129/85 | HR 91 | Temp 98.2°F | Ht 66.0 in | Wt 260.0 lb

## 2020-12-22 DIAGNOSIS — E8881 Metabolic syndrome: Secondary | ICD-10-CM

## 2020-12-22 DIAGNOSIS — Z6841 Body Mass Index (BMI) 40.0 and over, adult: Secondary | ICD-10-CM

## 2020-12-22 DIAGNOSIS — E7849 Other hyperlipidemia: Secondary | ICD-10-CM | POA: Diagnosis not present

## 2020-12-22 NOTE — Progress Notes (Signed)
Chief Complaint:   OBESITY Carrie Buck is here to discuss her progress with her obesity treatment plan along with follow-up of her obesity related diagnoses. Carrie Buck is on the Category 3 Plan and states she is following her eating plan approximately 30% of the time. Chabely states she is not currently exercising.  Today's visit was #: 31 Starting weight: 260 lbs Starting date: 02/20/2018 Today's weight: 260 lbs Today's date: 12/22/2020 Total lbs lost to date: 0 Total lbs lost since last in-office visit: 0  Interim History: Over the last few weeks, pt has had a stressful few weeks at work. Breakfast and lunch has been on the fly and grab and go. Work has been very demanding. The next few weeks seem a little better. She is going out of town for Thanksgiving and the week prior to Christmas. Pt is curious to see what options for meal plans there are.  Subjective:   1. Other hyperlipidemia Carrie Buck's last LDL was 109, HDL 60, and triglycerides 160, and she is not on statin therapy.  2. Metabolic syndrome Carrie Buck is doing well on Ozempic 2 mg and reports some GI side effects.  Assessment/Plan:   1. Other hyperlipidemia Cardiovascular risk and specific lipid/LDL goals reviewed.  We discussed several lifestyle modifications today and Carrie Buck will continue to work on diet, exercise and weight loss efforts. Orders and follow up as documented in patient record. Repeat labs.  Counseling Intensive lifestyle modifications are the first line treatment for this issue. Dietary changes: Increase soluble fiber. Decrease simple carbohydrates. Exercise changes: Moderate to vigorous-intensity aerobic activity 150 minutes per week if tolerated. Lipid-lowering medications: see documented in medical record.  2. Metabolic syndrome Continue Ozempic 2 mg weekly. No refill needed at this time.  3. Obesity BMI today is 27  Carrie Buck is currently in the action stage of change. As such, her goal is to  continue with weight loss efforts. She has agreed to the Category 3 Plan and the Pescatarian Plan + 300 calories.   Exercise goals: All adults should avoid inactivity. Some physical activity is better than none, and adults who participate in any amount of physical activity gain some health benefits.  Behavioral modification strategies: increasing lean protein intake, meal planning and cooking strategies, keeping healthy foods in the home, travel eating strategies, and holiday eating strategies .  Carrie Buck has agreed to follow-up with our clinic in 3 weeks. She was informed of the importance of frequent follow-up visits to maximize her success with intensive lifestyle modifications for her multiple health conditions.   Objective:   Blood pressure 129/85, pulse 91, temperature 98.2 F (36.8 C), height 5\' 6"  (1.676 m), weight 260 lb (117.9 kg), last menstrual period 12/14/2020, SpO2 97 %. Body mass index is 41.97 kg/m.  General: Cooperative, alert, well developed, in no acute distress. HEENT: Conjunctivae and lids unremarkable. Cardiovascular: Regular rhythm.  Lungs: Normal work of breathing. Neurologic: No focal deficits.   Lab Results  Component Value Date   CREATININE 0.85 10/19/2020   BUN 11 10/19/2020   NA 139 10/19/2020   K 4.0 10/19/2020   CL 101 10/19/2020   CO2 23 10/19/2020   Lab Results  Component Value Date   ALT 13 10/19/2020   AST 15 10/19/2020   ALKPHOS 57 10/19/2020   BILITOT 0.4 10/19/2020   Lab Results  Component Value Date   HGBA1C 5.4 10/19/2020   HGBA1C 5.2 04/05/2020   HGBA1C 5.3 10/23/2019   HGBA1C 5.2 02/20/2018   Lab Results  Component Value Date   INSULIN 17.0 10/19/2020   INSULIN 23.7 04/05/2020   INSULIN 16.9 10/23/2019   INSULIN 20.8 06/18/2019   INSULIN 15.3 02/20/2018   Lab Results  Component Value Date   TSH 2.680 02/20/2018   Lab Results  Component Value Date   CHOL 192 10/19/2020   HDL 60 10/19/2020   LDLCALC 109 (H) 10/19/2020    TRIG 130 10/19/2020   Lab Results  Component Value Date   VD25OH 55.9 10/19/2020   VD25OH 50.3 04/05/2020   VD25OH 48.4 10/23/2019   Lab Results  Component Value Date   WBC 7.6 06/18/2019   HGB 14.1 06/18/2019   HCT 41.9 06/18/2019   MCV 89 06/18/2019   PLT 252 06/18/2019    Attestation Statements:   Reviewed by clinician on day of visit: allergies, medications, problem list, medical history, surgical history, family history, social history, and previous encounter notes.  Edmund Hilda, CMA, am acting as transcriptionist for Reuben Likes, MD.   I have reviewed the above documentation for accuracy and completeness, and I agree with the above. - Reuben Likes, MD

## 2021-01-17 ENCOUNTER — Ambulatory Visit (INDEPENDENT_AMBULATORY_CARE_PROVIDER_SITE_OTHER): Payer: 59 | Admitting: Family Medicine

## 2021-03-04 ENCOUNTER — Emergency Department (HOSPITAL_BASED_OUTPATIENT_CLINIC_OR_DEPARTMENT_OTHER)
Admission: EM | Admit: 2021-03-04 | Discharge: 2021-03-05 | Disposition: A | Discharge status: Home / Self Care | Payer: 59 | Attending: Emergency Medicine | Admitting: Emergency Medicine

## 2021-03-04 ENCOUNTER — Emergency Department (HOSPITAL_BASED_OUTPATIENT_CLINIC_OR_DEPARTMENT_OTHER): Payer: 59

## 2021-03-04 ENCOUNTER — Other Ambulatory Visit: Payer: Self-pay

## 2021-03-04 ENCOUNTER — Encounter (HOSPITAL_BASED_OUTPATIENT_CLINIC_OR_DEPARTMENT_OTHER): Payer: Self-pay

## 2021-03-04 DIAGNOSIS — Z79899 Other long term (current) drug therapy: Secondary | ICD-10-CM | POA: Insufficient documentation

## 2021-03-04 DIAGNOSIS — R1011 Right upper quadrant pain: Secondary | ICD-10-CM | POA: Insufficient documentation

## 2021-03-04 DIAGNOSIS — R1013 Epigastric pain: Secondary | ICD-10-CM | POA: Diagnosis present

## 2021-03-04 DIAGNOSIS — Z7984 Long term (current) use of oral hypoglycemic drugs: Secondary | ICD-10-CM | POA: Insufficient documentation

## 2021-03-04 DIAGNOSIS — R7309 Other abnormal glucose: Secondary | ICD-10-CM | POA: Insufficient documentation

## 2021-03-04 LAB — COMPREHENSIVE METABOLIC PANEL
ALT: 10 U/L (ref 0–44)
AST: 11 U/L — ABNORMAL LOW (ref 15–41)
Albumin: 4.4 g/dL (ref 3.5–5.0)
Alkaline Phosphatase: 44 U/L (ref 38–126)
Anion gap: 11 (ref 5–15)
BUN: 10 mg/dL (ref 6–20)
CO2: 25 mmol/L (ref 22–32)
Calcium: 10.1 mg/dL (ref 8.9–10.3)
Chloride: 103 mmol/L (ref 98–111)
Creatinine, Ser: 0.85 mg/dL (ref 0.44–1.00)
GFR, Estimated: >60 mL/min (ref >60)
Glucose, Bld: 100 mg/dL — ABNORMAL HIGH (ref 70–99)
Potassium: 3.9 mmol/L (ref 3.5–5.1)
Sodium: 139 mmol/L (ref 135–145)
Total Bilirubin: 0.4 mg/dL (ref 0.3–1.2)
Total Protein: 7.7 g/dL (ref 6.5–8.1)

## 2021-03-04 LAB — CBC
HCT: 41.8 % (ref 36.0–46.0)
Hemoglobin: 13.8 g/dL (ref 12.0–15.0)
MCH: 29.4 pg (ref 26.0–34.0)
MCHC: 33 g/dL (ref 30.0–36.0)
MCV: 88.9 fL (ref 80.0–100.0)
Platelets: 307 10*3/uL (ref 150–400)
RBC: 4.7 MIL/uL (ref 3.87–5.11)
RDW: 12.7 % (ref 11.5–15.5)
WBC: 9.5 10*3/uL (ref 4.0–10.5)
nRBC: 0 % (ref 0.0–0.2)

## 2021-03-04 LAB — URINALYSIS, ROUTINE W REFLEX MICROSCOPIC
Bilirubin Urine: NEGATIVE
Glucose, UA: NEGATIVE mg/dL
Leukocytes,Ua: NEGATIVE
Nitrite: NEGATIVE
Specific Gravity, Urine: 1.026 (ref 1.005–1.030)
pH: 6 (ref 5.0–8.0)

## 2021-03-04 LAB — PREGNANCY, URINE: Preg Test, Ur: NEGATIVE

## 2021-03-04 LAB — LIPASE, BLOOD: Lipase: 22 U/L (ref 11–51)

## 2021-03-04 MED ORDER — HYDROMORPHONE HCL 1 MG/ML IJ SOLN
1.0000 mg | Freq: Once | INTRAMUSCULAR | Status: AC
  Administered 2021-03-04: 1 mg via INTRAVENOUS
  Filled 2021-03-04: qty 1

## 2021-03-04 MED ORDER — SODIUM CHLORIDE 0.9 % IV SOLN: INTRAVENOUS | Status: DC

## 2021-03-04 MED ORDER — ONDANSETRON 4 MG PO TBDP
4.0000 mg | ORAL_TABLET | Freq: Once | ORAL | Status: AC
  Administered 2021-03-04: 4 mg via ORAL
  Filled 2021-03-04: qty 1

## 2021-03-04 MED ORDER — IOHEXOL 300 MG/ML  SOLN
100.0000 mL | Freq: Once | INTRAMUSCULAR | Status: AC | PRN
  Administered 2021-03-04: 100 mL via INTRAVENOUS

## 2021-03-04 MED ORDER — ONDANSETRON 4 MG PO TBDP: 4.0000 mg | ORAL_TABLET | Freq: Once | ORAL | Status: DC

## 2021-03-04 NOTE — ED Notes (Signed)
Pt stated that she was having moderate pain to her abdomen, center and right side. Pain does increase if she tries to do a crunch and only slightly upon palpation. Pt had no pain increase upon urinating and no note of blood. Pt rates it a 7/10. No recent trauma or injury to note.

## 2021-03-04 NOTE — ED Provider Notes (Signed)
[facility] EMERGENCY DEPT Provider Note   CSN: [id number] Arrival date & time: 03/04/21  1744     History  Chief Complaint  Patient presents with   Abdominal Pain    Carrie Buck is a 41 y.o. female.  Patient with onset yesterday at 1300 of right upper quadrant epigastric abdominal pain has been constant since it began.  Kind of a burning type sensation.  Associated with nausea but no vomiting.  Patient's had some pain like this in the past that is been brief but never lasted this long.  Patient seen at [facility] and referred in for concerns for gallbladder related issues.  Past medical history is significant for lap band surgery obesity prediabetes.  Patient had laparoscopic appendectomy in 2019.      Home Medications Prior to Admission medications   Medication Sig Start Date End Date Taking? Authorizing Provider  Ascorbic Acid (VITAMIN C) 500 MG CAPS See admin instructions.    [provider]  Cholecalciferol (VITAMIN D3) 250 MCG (10000 UT) TABS Take 10,000 Units by mouth 3 (three) times a week. 02/13/20   Laqueta Linden, MD  CRYSELLE-28 0.3-30 MG-MCG tablet TAKE 1 TABLET BY MOUTH EVERY DAY 08/19/18   Clovia Cuff C, MD  FLUoxetine (PROZAC) 20 MG tablet Take 20 mg by mouth daily.    [provider]  metFORMIN (GLUCOPHAGE) 500 MG tablet Take 1 tablet (500 mg total) by mouth daily with breakfast. 10/19/20   Laqueta Linden, MD  Semaglutide, 1 MG/DOSE, 4 MG/3ML SOPN Inject 2 mg as directed once a week. 12/02/20   Laqueta Linden, MD  vitamin B-12 (CYANOCOBALAMIN) 100 MCG tablet See admin instructions.    [provider]      Allergies    Penicillins    Review of Systems   Review of Systems  Constitutional:  Negative for chills and fever.  HENT:  Negative for ear pain and sore throat.   Eyes:  Negative for pain and visual disturbance.  Respiratory:  Negative for cough and shortness of breath.   Cardiovascular:  Negative  for chest pain and palpitations.  Gastrointestinal:  Positive for abdominal pain and nausea. Negative for vomiting.  Genitourinary:  Negative for dysuria and hematuria.  Musculoskeletal:  Negative for arthralgias and back pain.  Skin:  Negative for color change and rash.  Neurological:  Negative for seizures and syncope.  All other systems reviewed and are negative.  Physical Exam Updated Vital Signs BP 140/75    Pulse 77    Temp 98.1 F (36.7 C)    Resp 19    Ht 1.651 m (_0 )    Wt 117.9 kg    SpO2 100%    BMI 43.27 kg/m  Physical Exam Vitals and nursing note reviewed.  Constitutional:      General: She is not in acute distress.    Appearance: She is well-developed.  HENT:     Head: Normocephalic and atraumatic.  Eyes:     Conjunctiva/sclera: Conjunctivae normal.     Pupils: Pupils are equal, round, and reactive to light.  Cardiovascular:     Rate and Rhythm: Normal rate and regular rhythm.     Heart sounds: No murmur heard. Pulmonary:     Effort: Pulmonary effort is normal. No respiratory distress.     Breath sounds: Normal breath sounds.  Abdominal:     Palpations: Abdomen is soft.     Tenderness: There is abdominal tenderness. There is no guarding.  Comments: Tender right upper quadrant no guarding  Musculoskeletal:        General: No swelling.     Cervical back: Neck supple.  Skin:    General: Skin is warm and dry.     Capillary Refill: Capillary refill takes less than 2 seconds.  Neurological:     General: No focal deficit present.     Mental Status: She is alert and oriented to person, place, and time.     Cranial Nerves: No cranial nerve deficit.     Sensory: No sensory deficit.     Motor: No weakness.     Coordination: Coordination normal.  Psychiatric:        Mood and Affect: Mood normal.    ED Results / Procedures / Treatments   Labs (all labs ordered are listed, but only abnormal results are displayed) Labs Reviewed  COMPREHENSIVE METABOLIC PANEL  - Abnormal; Notable for the following components:      Result Value   Glucose, Bld 100 (*)    AST 11 (*)    All other components within normal limits  URINALYSIS, ROUTINE W REFLEX MICROSCOPIC - Abnormal; Notable for the following components:   Hgb urine dipstick MODERATE (*)    Ketones, ur TRACE (*)    Protein, ur TRACE (*)    Bacteria, UA RARE (*)    All other components within normal limits  URINE CULTURE  LIPASE, BLOOD  CBC  PREGNANCY, URINE    EKG None  Radiology US Abdomen Limited RUQ (LIVER/GB)  Result Date: 03/04/2021 CLINICAL DATA:  Right upper quadrant and epigastric pain. EXAM: ULTRASOUND ABDOMEN LIMITED RIGHT UPPER QUADRANT COMPARISON:  CT abdomen and pelvis 03/25/2017. FINDINGS: Gallbladder: No gallstones or wall thickening visualized. Positive sonographic Murphy sign noted by sonographer. Common bile duct: Diameter: 2.9 mm Liver: No focal lesion identified. Increase in parenchymal echogenicity. Portal vein is patent on color Doppler imaging with normal direction of blood flow towards the liver. Other: None. IMPRESSION: 1. Sonographer notes positive sonographic Murphy sign; however, there is no additional sonographic evidence for cholelithiasis or acute cholecystitis. Please correlate clinically. 2. Echogenic liver may be related to diffuse hepatocellular disease such as fatty infiltration. Electronically Signed   By: Ronney Asters M.D.   On: 03/04/2021 22:45    Procedures Procedures    Medications Ordered in ED Medications  0.9 %  sodium chloride infusion ( Intravenous New Bag/Given 03/04/21 2304)  ondansetron (ZOFRAN-ODT) disintegrating tablet 4 mg (4 mg Oral Given 03/04/21 2220)  HYDROmorphone (DILAUDID) injection 1 mg (1 mg Intravenous Given 03/04/21 2302)    ED Course/ Medical Decision Making/ A&P                           Medical Decision Making Amount and/or Complexity of Data Reviewed Labs: ordered. Radiology: ordered.  Risk Prescription drug  management.   Patient with tenderness right upper quadrant labs very normal.  But clinically this does seem like is probably prolonged biliary colic.  Will get ultrasound right upper quadrant to see if there is any evidence of any gallbladder stones.  If that is negative we will do CT of abdomen and pelvis.  Patient's urinalysis had some red blood cells.  Rare bacteria and 0-5 whites.  Nitrite was normal.  Pregnancy test is negative.  Lipase was normal complete metabolic panel without any acute abnormalities including total bili alk phos.  So no lab abnormalities.  No leukocytosis white blood cell count was  9.5 hemoglobin 13.8.  Ultrasound of the right upper quadrant without any acute findings.  No evidence of acute cholecystitis no evidence of cholelithiasis.  Common bile duct is normal.  Since patient has had gastric banding we will go ahead and do CT abdomen and pelvis to evaluate further.  That make sure no acute process.  If that shows nothing significant then patient can be discharged and follow-up with her doctors.   Final Clinical Impression(s) / ED Diagnoses Final diagnoses:  RUQ pain    Rx / DC Orders ED Discharge Orders     None         Fredia Sorrow, MD 03/04/21 [phone]

## 2021-03-04 NOTE — ED Triage Notes (Signed)
Pt came POV c/o of Abd pain. She was seen at [facility] today and they recommended to be seen in ER to r/o gallbladder related issues. Pt states this has been happening since 2pm yesterday.

## 2021-03-04 NOTE — ED Notes (Signed)
Report given to the on coming RN. °

## 2021-03-07 LAB — URINE CULTURE: Culture: 70000 — AB

## 2021-03-08 ENCOUNTER — Telehealth: Payer: Self-pay | Admitting: *Deleted

## 2021-03-08 NOTE — Telephone Encounter (Signed)
Post ED Visit - Positive Culture Follow-up  Culture report reviewed by antimicrobial stewardship pharmacist: [facility] Team []  Elenor Quinones, Pharm.D. []  Heide Guile, Pharm.D., BCPS AQ-ID []  Parks Neptune, Pharm.D., BCPS []  Alycia Rossetti, Pharm.D., BCPS []  [place], Pharm.D., BCPS, AAHIVP []  Legrand Como, Pharm.D., BCPS, AAHIVP []  Salome Arnt, PharmD, BCPS []  Johnnette Gourd, PharmD, BCPS []  Hughes Better, PharmD, BCPS []  Leeroy Cha, PharmD []  Laqueta Linden, PharmD, BCPS []  Albertina Parr, PharmD  [facility] Team []  Leodis Sias, PharmD []  Lindell Spar, PharmD []  Royetta Asal, PharmD []  Graylin Shiver, Rph []  Rema Fendt) Glennon Mac, PharmD []  Arlyn Dunning, PharmD []  Netta Cedars, PharmD []  Dia Sitter, PharmD []  Leone Haven, PharmD []  Gretta Arab, PharmD []  Theodis Shove, PharmD []  Peggyann Juba, PharmD []  Reuel Boom, PharmD   Positive urine culture No antibiotics needed and no further patient follow-up is required at this time. Calvert Cantor, [place]  Harlon Flor [place] 03/08/2021, 12:13 PM

## 2021-03-14 ENCOUNTER — Other Ambulatory Visit (HOSPITAL_COMMUNITY): Payer: Self-pay | Admitting: Gastroenterology

## 2021-03-14 ENCOUNTER — Ambulatory Visit
Admission: RE | Admit: 2021-03-14 | Discharge: 2021-03-14 | Disposition: A | Discharge status: Home / Self Care | Payer: 59 | Source: Ambulatory Visit | Attending: Gastroenterology | Admitting: Gastroenterology

## 2021-03-14 ENCOUNTER — Other Ambulatory Visit: Payer: Self-pay | Admitting: Gastroenterology

## 2021-03-14 DIAGNOSIS — R110 Nausea: Secondary | ICD-10-CM

## 2021-03-14 DIAGNOSIS — R1011 Right upper quadrant pain: Secondary | ICD-10-CM

## 2021-03-24 ENCOUNTER — Other Ambulatory Visit: Payer: Self-pay

## 2021-03-24 ENCOUNTER — Ambulatory Visit (HOSPITAL_COMMUNITY)
Admission: RE | Admit: 2021-03-24 | Discharge: 2021-03-24 | Disposition: A | Discharge status: Home / Self Care | Payer: 59 | Source: Ambulatory Visit | Attending: Gastroenterology | Admitting: Gastroenterology

## 2021-03-24 DIAGNOSIS — R110 Nausea: Secondary | ICD-10-CM | POA: Insufficient documentation

## 2021-03-24 DIAGNOSIS — R1011 Right upper quadrant pain: Secondary | ICD-10-CM | POA: Insufficient documentation

## 2021-03-24 MED ORDER — TECHNETIUM TC 99M MEBROFENIN IV KIT
5.0000 | PACK | Freq: Once | INTRAVENOUS | Status: AC | PRN
  Administered 2021-03-24: 5 via INTRAVENOUS

## 2021-04-11 ENCOUNTER — Ambulatory Visit: Payer: Self-pay | Admitting: Surgery

## 2021-04-29 NOTE — Progress Notes (Signed)
Surgery orders requested via Epic inbox. °

## 2021-04-29 NOTE — Patient Instructions (Addendum)
DUE TO COVID-19 ONLY ONE VISITOR  (aged 41 and older)  IS ALLOWED TO COME WITH YOU AND STAY IN THE WAITING ROOM ONLY DURING PRE OP AND PROCEDURE.   ?**NO VISITORS ARE ALLOWED IN THE SHORT STAY AREA OR RECOVERY ROOM!!** ? ?IF YOU WILL BE ADMITTED INTO THE HOSPITAL YOU ARE ALLOWED ONLY TWO SUPPORT PEOPLE DURING VISITATION HOURS ONLY (7 AM -8PM)   ?The support person(s) must pass our screening, gel in and out, and wear a mask at all times, including in the patient?s room. ?Patients must also wear a mask when staff or their support person are in the room. ?Visitors GUEST BADGE MUST BE WORN VISIBLY  ?One adult visitor may remain with you overnight and MUST be in the room by 8 P.M. ?  ? ? Your procedure is scheduled on: 05/10/21 ? ? [facility] ? ?  Report to admitting at : 5:15 AM ? ? Call this number if you have problems the morning of surgery [phone] ? ?Eat a light diet the day before surgery.  Examples including soups, broths, toast, yogurt, mashed potatoes.  Things to avoid include carbonated beverages (fizzy beverages), raw fruits and raw vegetables, or beans.  ? ?If your bowels are filled with gas, your surgeon will have difficulty visualizing your pelvic organs which increases your surgical risks.  ?  ?Do not eat food :After Midnight. ? ? After Midnight you may have the following liquids until : 4:30 AM DAY OF SURGERY ? ?Water ?Black Coffee (sugar ok, NO MILK/CREAM OR CREAMERS)  ?Tea (sugar ok, NO MILK/CREAM OR CREAMERS) regular and decaf                             ?Plain Jell-O (NO RED)                                           ?Fruit ices (not with fruit pulp, NO RED)                                     ?Popsicles (NO RED)                                                                  ?Juice: apple, WHITE grape, WHITE cranberry ?Sports drinks like Gatorade (NO RED) ?Clear broth(vegetable,chicken,beef)  ?  ?Oral Hygiene is also important to reduce your risk of infection.                                     ?Remember - BRUSH YOUR TEETH THE MORNING OF SURGERY WITH YOUR REGULAR TOOTHPASTE ? ? Do NOT smoke after Midnight ? ? Take these medicines the morning of surgery with A SIP OF WATER: fluoxetine. ? ?How to Manage Your Diabetes ?Before and After Surgery ? ?Why is it important to control my blood sugar before and after surgery? ?Improving blood sugar levels before and after surgery helps healing and  can limit problems. ?A way of improving blood sugar control is eating a healthy diet by: ? Eating less sugar and carbohydrates ? Increasing activity/exercise ? Talking with your doctor about reaching your blood sugar goals ?High blood sugars (greater than 180 mg/dL) can raise your risk of infections and slow your recovery, so you will need to focus on controlling your diabetes during the weeks before surgery. ?Make sure that the doctor who takes care of your diabetes knows about your planned surgery including the date and location. ? ?How do I manage my blood sugar before surgery? ?Check your blood sugar at least 4 times a day, starting 2 days before surgery, to make sure that the level is not too high or low. ?Check your blood sugar the morning of your surgery when you wake up and every 2 hours until you get to the Short Stay unit. ?If your blood sugar is less than 70 mg/dL, you will need to treat for low blood sugar: ?Do not take insulin. ?Treat a low blood sugar (less than 70 mg/dL) with ? cup of clear juice (cranberry or apple), 4 glucose tablets, OR glucose gel. ?Recheck blood sugar in 15 minutes after treatment (to make sure it is greater than 70 mg/dL). If your blood sugar is not greater than 70 mg/dL on recheck, call [phone] for further instructions. ?Report your blood sugar to the short stay nurse when you get to Short Stay. ? ?If you are admitted to the hospital after surgery: ?Your blood sugar will be checked by the staff and you will probably be given insulin after surgery  (instead of oral diabetes medicines) to make sure you have good blood sugar levels. ?The goal for blood sugar control after surgery is 80-180 mg/dL. ? ? ?WHAT DO I DO ABOUT MY DIABETES MEDICATION? ? ?Do not take oral diabetes medicines (pills) the morning of surgery. ? ?THE NIGHT BEFORE SURGERY, take metformin as usual.    ? ? ?THE MORNING OF SURGERY, DO NOT TAKE ANY ORAL DIABETIC MEDICATIONS DAY OF YOUR SURGERY ? ?The day of surgery, do not take other diabetes injectables, including Byetta (exenatide), Bydureon (exenatide ER), Victoza (liraglutide), or Trulicity (dulaglutide). ? ?Bring CPAP mask and tubing day of surgery. ?                  ?           You may not have any metal on your body including hair pins, jewelry, and body piercing ? ?           Do not wear make-up, lotions, powders, perfumes/cologne, or deodorant ? ?Do not wear nail polish including gel and S&S, artificial/acrylic nails, or any other type of covering on natural nails including finger and toenails. If you have artificial nails, gel coating, etc. that needs to be removed by a nail salon please have this removed prior to surgery or surgery may need to be canceled/ delayed if the surgeon/ anesthesia feels like they are unable to be safely monitored.  ? ?Do not shave  48 hours prior to surgery.  ? ? Do not bring valuables to the hospital. [facility] IS NOT ?            RESPONSIBLE   FOR VALUABLES. ? ? Contacts, dentures or bridgework may not be worn into surgery. ? ? Bring small overnight bag day of surgery. ?  ? Patients discharged on the day of surgery will not be allowed to drive home.  Someone  NEEDS to stay with you for the first 24 hours after anesthesia. ? ? Special Instructions: Bring a copy of your healthcare power of attorney and living will documents         the day of surgery if you haven't scanned them before. ? ?            Please read over the following fact sheets you were given: IF YOU HAVE QUESTIONS ABOUT YOUR PRE-OP  INSTRUCTIONS PLEASE CALL [phone] ? ?   [facility] - Preparing for Surgery ?Before surgery, you can play an important role.  Because skin is not sterile, your skin needs to be as free of germs as possible.  You can reduce the number of germs on your skin by washing with CHG (chlorahexidine gluconate) soap before surgery.  CHG is an antiseptic cleaner which kills germs and bonds with the skin to continue killing germs even after washing. ?Please DO NOT use if you have an allergy to CHG or antibacterial soaps.  If your skin becomes reddened/irritated stop using the CHG and inform your nurse when you arrive at Short Stay. ?Do not shave (including legs and underarms) for at least 48 hours prior to the first CHG shower.  You may shave your face/neck. ?Please follow these instructions carefully: ? 1.  Shower with CHG Soap the night before surgery and the  morning of Surgery. ? 2.  If you choose to wash your hair, wash your hair first as usual with your  normal  shampoo. ? 3.  After you shampoo, rinse your hair and body thoroughly to remove the  shampoo.                           4.  Use CHG as you would any other liquid soap.  You can apply chg directly  to the skin and wash  ?                     Gently with a scrungie or clean washcloth. ? 5.  Apply the CHG Soap to your body ONLY FROM THE NECK DOWN.   Do not use on face/ open      ?                     Wound or open sores. Avoid contact with eyes, ears mouth and genitals (private parts).  ?                     Engineering geologist,  Genitals (private parts) with your normal soap. ?            6.  Wash thoroughly, paying special attention to the area where your surgery  will be performed. ? 7.  Thoroughly rinse your body with warm water from the neck down. ? 8.  DO NOT shower/wash with your normal soap after using and rinsing off  the CHG Soap. ?               9.  Pat yourself dry with a clean towel. ?           10.  Wear clean pajamas. ?           11.  Place clean sheets on  your bed the night of your first shower and do not  sleep with pets. ?Day of Surgery : ?Do not apply any lotions/deodorants the morning of surgery.  Please  wear clean clothes to the hospital/surgery center. ? ?FAIL

## 2021-05-02 ENCOUNTER — Ambulatory Visit: Payer: Self-pay | Admitting: General Surgery

## 2021-05-03 ENCOUNTER — Encounter (HOSPITAL_COMMUNITY)
Admission: RE | Admit: 2021-05-03 | Discharge: 2021-05-03 | Disposition: A | Discharge status: Home / Self Care | Payer: 59 | Source: Ambulatory Visit | Attending: General Surgery | Admitting: General Surgery

## 2021-05-03 ENCOUNTER — Encounter (HOSPITAL_COMMUNITY): Payer: Self-pay

## 2021-05-03 ENCOUNTER — Other Ambulatory Visit: Payer: Self-pay

## 2021-05-03 VITALS — BP 139/79 | HR 91 | Temp 98.4°F | Resp 14 | Ht 65.5 in | Wt 285.0 lb

## 2021-05-03 DIAGNOSIS — R7303 Prediabetes: Secondary | ICD-10-CM | POA: Insufficient documentation

## 2021-05-03 DIAGNOSIS — Z01818 Encounter for other preprocedural examination: Secondary | ICD-10-CM | POA: Insufficient documentation

## 2021-05-03 HISTORY — DX: Cardiac murmur, unspecified: R01.1

## 2021-05-03 HISTORY — DX: Nausea with vomiting, unspecified: R11.2

## 2021-05-03 HISTORY — DX: Other specified postprocedural states: Z98.890

## 2021-05-03 HISTORY — DX: Other complications of anesthesia, initial encounter: T88.59XA

## 2021-05-03 LAB — CBC
HCT: 41.6 % (ref 36.0–46.0)
Hemoglobin: 13.7 g/dL (ref 12.0–15.0)
MCH: 29.5 pg (ref 26.0–34.0)
MCHC: 32.9 g/dL (ref 30.0–36.0)
MCV: 89.5 fL (ref 80.0–100.0)
Platelets: 221 10*3/uL (ref 150–400)
RBC: 4.65 MIL/uL (ref 3.87–5.11)
RDW: 13 % (ref 11.5–15.5)
WBC: 5.4 10*3/uL (ref 4.0–10.5)
nRBC: 0 % (ref 0.0–0.2)

## 2021-05-03 LAB — HEMOGLOBIN A1C
Hgb A1c MFr Bld: 5.4 % (ref 4.8–5.6)
Mean Plasma Glucose: 108.28 mg/dL

## 2021-05-03 LAB — GLUCOSE, CAPILLARY: Glucose-Capillary: 187 mg/dL — ABNORMAL HIGH (ref 70–99)

## 2021-05-03 NOTE — Progress Notes (Signed)
For Short Stay: ?COVID SWAB appointment date: N/A ?Date of COVID positive in last 84 days:N/A ?COVID Vaccine: J&J x 1, Pfizer x 2 ?Bowel Prep reminder:N/A ? ? ?For Anesthesia: ?PCP - Sherral Hammers: PA ?Cardiologist -  ? ?Chest x-ray - 03/24/21 ?EKG -  ?Stress Test -  ?ECHO -  ?Cardiac Cath -  ?Pacemaker/ICD device last checked: ?Pacemaker orders received: ?Device Rep notified: ? ?Spinal Cord Stimulator: ? ?Sleep Study -  ?CPAP -  ? ?Fasting Blood Sugar - N/A ?Checks Blood Sugar ___0__ times a day ?Date and result of last Hgb A1c- ? ?Blood Thinner Instructions: ?Aspirin Instructions: ?Last Dose: ? ?Activity level: Can go up a flight of stairs and activities of daily living without stopping and without chest pain and/or shortness of breath ?  Able to exercise without chest pain and/or shortness of breath ?  Unable to go up a flight of stairs without chest pain and/or shortness of breath ?   ? ?Anesthesia review: Hx: Pre-DIA,Heart murmur ? ?Patient denies shortness of breath, fever, cough and chest pain at PAT appointment ? ? ?Patient verbalized understanding of instructions that were given to them at the PAT appointment. Patient was also instructed that they will need to review over the PAT instructions again at home before surgery.  ?

## 2021-05-09 ENCOUNTER — Encounter (HOSPITAL_COMMUNITY): Payer: Self-pay | Admitting: General Surgery

## 2021-05-09 NOTE — Anesthesia Preprocedure Evaluation (Addendum)
Anesthesia Evaluation  ?Patient identified by MRN, date of birth, ID band ?Patient awake ? ? ? ?Reviewed: ?Allergy & Precautions, NPO status , Patient's Chart, lab work & pertinent test results, reviewed documented beta blocker date and time  ? ?History of Anesthesia Complications ?(+) PONV and history of anesthetic complications ? ?Airway ?Mallampati: II ? ?TM Distance: >3 FB ?Neck ROM: Full ? ? ? Dental ?no notable dental hx. ?(+) Teeth Intact, Dental Advisory Given ?  ?Pulmonary ?neg pulmonary ROS,  ?  ?Pulmonary exam normal ?breath sounds clear to auscultation ? ? ? ? ? ? Cardiovascular ?negative cardio ROS ?Normal cardiovascular exam+ Valvular Problems/Murmurs  ?Rhythm:Regular Rate:Normal ? ? ?  ?Neuro/Psych ? Headaches, Anxiety   ? GI/Hepatic ?Neg liver ROS, Biliary dyskinesia ?Undesired lap gastric band ?  ?Endo/Other  ?diabetes, Well Controlled, Type 2, Insulin Dependent, Oral Hypoglycemic AgentsMorbid obesity ? Renal/GU ?negative Renal ROS  ?negative genitourinary ?  ?Musculoskeletal ?negative musculoskeletal ROS ?(+)  ? Abdominal ?(+) + obese,   ?Peds ? Hematology ?negative hematology ROS ?(+)   ?Anesthesia Other Findings ? ? Reproductive/Obstetrics ? ?  ? ? ? ? ? ? ? ? ? ? ? ? ? ?  ?  ? ? ? ? ? ? ? ?Anesthesia Physical ?Anesthesia Plan ? ?ASA: 3 ? ?Anesthesia Plan: General  ? ?Post-op Pain Management: Precedex, Dilaudid IV, Tylenol PO (pre-op)* and Ketamine IV*  ? ?Induction: Intravenous, Rapid sequence and Cricoid pressure planned ? ?PONV Risk Score and Plan: Scopolamine patch - Pre-op, Midazolam, Ondansetron, Dexamethasone and Treatment may vary due to age or medical condition ? ?Airway Management Planned: Oral ETT ? ?Additional Equipment: None ? ?Intra-op Plan:  ? ?Post-operative Plan: Extubation in OR ? ?Informed Consent: I have reviewed the patients History and Physical, chart, labs and discussed the procedure including the risks, benefits and alternatives for the  proposed anesthesia with the patient or authorized representative who has indicated his/her understanding and acceptance.  ? ? ? ?Dental advisory given ? ?Plan Discussed with: CRNA and Anesthesiologist ? ?Anesthesia Plan Comments:   ? ? ? ? ? ?Anesthesia Quick Evaluation ? ?

## 2021-05-10 ENCOUNTER — Ambulatory Visit (HOSPITAL_COMMUNITY): Payer: 59 | Admitting: Anesthesiology

## 2021-05-10 ENCOUNTER — Encounter (HOSPITAL_COMMUNITY): Payer: Self-pay | Admitting: General Surgery

## 2021-05-10 ENCOUNTER — Ambulatory Visit (HOSPITAL_BASED_OUTPATIENT_CLINIC_OR_DEPARTMENT_OTHER): Payer: 59 | Admitting: Anesthesiology

## 2021-05-10 ENCOUNTER — Ambulatory Visit (HOSPITAL_COMMUNITY)
Admission: RE | Admit: 2021-05-10 | Discharge: 2021-05-10 | Disposition: A | Discharge status: Home / Self Care | Payer: 59 | Attending: General Surgery | Admitting: General Surgery

## 2021-05-10 ENCOUNTER — Other Ambulatory Visit: Payer: Self-pay

## 2021-05-10 ENCOUNTER — Encounter (HOSPITAL_COMMUNITY)
Admission: RE | Disposition: A | Discharge status: Home / Self Care | Payer: Self-pay | Source: Home / Self Care | Attending: General Surgery

## 2021-05-10 DIAGNOSIS — Z9884 Bariatric surgery status: Secondary | ICD-10-CM | POA: Diagnosis not present

## 2021-05-10 DIAGNOSIS — K811 Chronic cholecystitis: Secondary | ICD-10-CM | POA: Insufficient documentation

## 2021-05-10 DIAGNOSIS — Z4651 Encounter for fitting and adjustment of gastric lap band: Secondary | ICD-10-CM | POA: Diagnosis not present

## 2021-05-10 DIAGNOSIS — E119 Type 2 diabetes mellitus without complications: Secondary | ICD-10-CM | POA: Diagnosis not present

## 2021-05-10 DIAGNOSIS — F419 Anxiety disorder, unspecified: Secondary | ICD-10-CM | POA: Diagnosis not present

## 2021-05-10 DIAGNOSIS — Z6841 Body Mass Index (BMI) 40.0 and over, adult: Secondary | ICD-10-CM | POA: Diagnosis not present

## 2021-05-10 DIAGNOSIS — K828 Other specified diseases of gallbladder: Secondary | ICD-10-CM | POA: Diagnosis not present

## 2021-05-10 DIAGNOSIS — Z7984 Long term (current) use of oral hypoglycemic drugs: Secondary | ICD-10-CM | POA: Diagnosis not present

## 2021-05-10 DIAGNOSIS — Z01818 Encounter for other preprocedural examination: Secondary | ICD-10-CM

## 2021-05-10 DIAGNOSIS — Z79899 Other long term (current) drug therapy: Secondary | ICD-10-CM | POA: Insufficient documentation

## 2021-05-10 HISTORY — PX: LAPAROSCOPIC GASTRIC BAND REMOVAL WITH LAPAROSCOPIC GASTRIC SLEEVE RESECTION: SHX6498

## 2021-05-10 HISTORY — PX: CHOLECYSTECTOMY: SHX55

## 2021-05-10 LAB — PREGNANCY, URINE: Preg Test, Ur: NEGATIVE

## 2021-05-10 SURGERY — LAPAROSCOPIC CHOLECYSTECTOMY
Anesthesia: General | Site: Abdomen

## 2021-05-10 MED ORDER — SCOPOLAMINE 1 MG/3DAYS TD PT72
1.0000 | MEDICATED_PATCH | TRANSDERMAL | Status: DC
  Administered 2021-05-10: 1.5 mg via TRANSDERMAL
  Filled 2021-05-10: qty 1

## 2021-05-10 MED ORDER — ROCURONIUM BROMIDE 10 MG/ML (PF) SYRINGE
PREFILLED_SYRINGE | INTRAVENOUS | Status: AC
  Filled 2021-05-10: qty 10

## 2021-05-10 MED ORDER — ORAL CARE MOUTH RINSE: 15.0000 mL | Freq: Once | OROMUCOSAL | Status: AC

## 2021-05-10 MED ORDER — ONDANSETRON HCL 4 MG/2ML IJ SOLN
INTRAMUSCULAR | Status: AC
  Filled 2021-05-10: qty 2

## 2021-05-10 MED ORDER — PHENYLEPHRINE 40 MCG/ML (10ML) SYRINGE FOR IV PUSH (FOR BLOOD PRESSURE SUPPORT)
PREFILLED_SYRINGE | INTRAVENOUS | Status: DC | PRN
  Administered 2021-05-10: 80 ug via INTRAVENOUS

## 2021-05-10 MED ORDER — DEXAMETHASONE SODIUM PHOSPHATE 10 MG/ML IJ SOLN
INTRAMUSCULAR | Status: DC | PRN
  Administered 2021-05-10: 10 mg via INTRAVENOUS

## 2021-05-10 MED ORDER — ONDANSETRON HCL 4 MG/2ML IJ SOLN
INTRAMUSCULAR | Status: DC | PRN
  Administered 2021-05-10: 4 mg via INTRAVENOUS

## 2021-05-10 MED ORDER — 0.9 % SODIUM CHLORIDE (POUR BTL) OPTIME
TOPICAL | Status: DC | PRN
  Administered 2021-05-10: 1000 mL

## 2021-05-10 MED ORDER — ACETAMINOPHEN 500 MG PO TABS: 1000.0000 mg | ORAL_TABLET | Freq: Three times a day (TID) | ORAL | 0 refills | Status: AC

## 2021-05-10 MED ORDER — SUGAMMADEX SODIUM 200 MG/2ML IV SOLN
INTRAVENOUS | Status: DC | PRN
  Administered 2021-05-10: 400 mg via INTRAVENOUS

## 2021-05-10 MED ORDER — CHLORHEXIDINE GLUCONATE CLOTH 2 % EX PADS: 6.0000 | MEDICATED_PAD | Freq: Once | CUTANEOUS | Status: DC

## 2021-05-10 MED ORDER — MIDAZOLAM HCL 2 MG/2ML IJ SOLN
INTRAMUSCULAR | Status: AC
  Filled 2021-05-10: qty 2

## 2021-05-10 MED ORDER — LIDOCAINE 2% (20 MG/ML) 5 ML SYRINGE
INTRAMUSCULAR | Status: DC | PRN
  Administered 2021-05-10: 100 mg via INTRAVENOUS

## 2021-05-10 MED ORDER — STERILE WATER FOR INJECTION IJ SOLN
INTRAMUSCULAR | Status: DC | PRN
  Administered 2021-05-10: 2 mL via INTRAVENOUS

## 2021-05-10 MED ORDER — OXYCODONE HCL 5 MG/5ML PO SOLN: 5.0000 mg | Freq: Once | ORAL | Status: AC | PRN

## 2021-05-10 MED ORDER — MIDAZOLAM HCL 5 MG/5ML IJ SOLN
INTRAMUSCULAR | Status: DC | PRN
  Administered 2021-05-10: 2 mg via INTRAVENOUS

## 2021-05-10 MED ORDER — CHLORHEXIDINE GLUCONATE 0.12 % MT SOLN
15.0000 mL | Freq: Once | OROMUCOSAL | Status: AC
  Administered 2021-05-10: 15 mL via OROMUCOSAL

## 2021-05-10 MED ORDER — LACTATED RINGERS IV SOLN
INTRAVENOUS | Status: DC | PRN
  Administered 2021-05-10: 1000 mL

## 2021-05-10 MED ORDER — ONDANSETRON HCL 4 MG/2ML IJ SOLN: 4.0000 mg | Freq: Once | INTRAMUSCULAR | Status: DC | PRN

## 2021-05-10 MED ORDER — SUCCINYLCHOLINE CHLORIDE 200 MG/10ML IV SOSY
PREFILLED_SYRINGE | INTRAVENOUS | Status: DC | PRN
  Administered 2021-05-10: 140 mg via INTRAVENOUS

## 2021-05-10 MED ORDER — EPHEDRINE 5 MG/ML INJ
INTRAVENOUS | Status: AC
  Filled 2021-05-10: qty 5

## 2021-05-10 MED ORDER — ROCURONIUM BROMIDE 10 MG/ML (PF) SYRINGE
PREFILLED_SYRINGE | INTRAVENOUS | Status: DC | PRN
  Administered 2021-05-10: 40 mg via INTRAVENOUS
  Administered 2021-05-10 (×2): 20 mg via INTRAVENOUS

## 2021-05-10 MED ORDER — CIPROFLOXACIN IN D5W 400 MG/200ML IV SOLN
400.0000 mg | INTRAVENOUS | Status: AC
  Administered 2021-05-10: 400 mg via INTRAVENOUS
  Filled 2021-05-10: qty 200

## 2021-05-10 MED ORDER — ACETAMINOPHEN 500 MG PO TABS
1000.0000 mg | ORAL_TABLET | ORAL | Status: AC
  Administered 2021-05-10: 1000 mg via ORAL
  Filled 2021-05-10: qty 2

## 2021-05-10 MED ORDER — OXYCODONE HCL 5 MG PO TABS
5.0000 mg | ORAL_TABLET | Freq: Once | ORAL | Status: AC | PRN
  Administered 2021-05-10: 5 mg via ORAL

## 2021-05-10 MED ORDER — SUCCINYLCHOLINE CHLORIDE 200 MG/10ML IV SOSY
PREFILLED_SYRINGE | INTRAVENOUS | Status: AC
  Filled 2021-05-10: qty 10

## 2021-05-10 MED ORDER — PROPOFOL 10 MG/ML IV BOLUS
INTRAVENOUS | Status: AC
  Filled 2021-05-10: qty 20

## 2021-05-10 MED ORDER — PHENYLEPHRINE 40 MCG/ML (10ML) SYRINGE FOR IV PUSH (FOR BLOOD PRESSURE SUPPORT)
PREFILLED_SYRINGE | INTRAVENOUS | Status: AC
  Filled 2021-05-10: qty 10

## 2021-05-10 MED ORDER — OXYCODONE HCL 5 MG PO TABS: 5.0000 mg | ORAL_TABLET | Freq: Four times a day (QID) | ORAL | 0 refills | Status: DC | PRN

## 2021-05-10 MED ORDER — SUGAMMADEX SODIUM 500 MG/5ML IV SOLN
INTRAVENOUS | Status: AC
  Filled 2021-05-10: qty 5

## 2021-05-10 MED ORDER — BUPIVACAINE-EPINEPHRINE 0.25% -1:200000 IJ SOLN
INTRAMUSCULAR | Status: DC | PRN
  Administered 2021-05-10: 30 mL

## 2021-05-10 MED ORDER — PROPOFOL 10 MG/ML IV BOLUS
INTRAVENOUS | Status: DC | PRN
  Administered 2021-05-10: 200 mg via INTRAVENOUS

## 2021-05-10 MED ORDER — BUPIVACAINE-EPINEPHRINE (PF) 0.25% -1:200000 IJ SOLN
INTRAMUSCULAR | Status: AC
  Filled 2021-05-10: qty 30

## 2021-05-10 MED ORDER — EPHEDRINE SULFATE-NACL 50-0.9 MG/10ML-% IV SOSY
PREFILLED_SYRINGE | INTRAVENOUS | Status: DC | PRN
  Administered 2021-05-10: 10 mg via INTRAVENOUS

## 2021-05-10 MED ORDER — LACTATED RINGERS IV SOLN: INTRAVENOUS | Status: DC

## 2021-05-10 MED ORDER — HYDROMORPHONE HCL 1 MG/ML IJ SOLN: 0.2500 mg | INTRAMUSCULAR | Status: DC | PRN

## 2021-05-10 MED ORDER — OXYCODONE HCL 5 MG PO TABS
ORAL_TABLET | ORAL | Status: AC
  Filled 2021-05-10: qty 1

## 2021-05-10 MED ORDER — FENTANYL CITRATE (PF) 250 MCG/5ML IJ SOLN
INTRAMUSCULAR | Status: AC
  Filled 2021-05-10: qty 5

## 2021-05-10 MED ORDER — DEXAMETHASONE SODIUM PHOSPHATE 10 MG/ML IJ SOLN
INTRAMUSCULAR | Status: AC
  Filled 2021-05-10: qty 1

## 2021-05-10 MED ORDER — HYDROMORPHONE HCL 1 MG/ML IJ SOLN
INTRAMUSCULAR | Status: AC
  Administered 2021-05-10: 0.5 mg via INTRAVENOUS
  Filled 2021-05-10: qty 1

## 2021-05-10 MED ORDER — FENTANYL CITRATE (PF) 250 MCG/5ML IJ SOLN
INTRAMUSCULAR | Status: DC | PRN
  Administered 2021-05-10 (×2): 50 ug via INTRAVENOUS
  Administered 2021-05-10: 100 ug via INTRAVENOUS
  Administered 2021-05-10: 50 ug via INTRAVENOUS

## 2021-05-10 SURGICAL SUPPLY — 75 items
APPLICATOR ARISTA FLEXITIP XL (MISCELLANEOUS) IMPLANT
APPLIER CLIP 5 13 M/L LIGAMAX5 (MISCELLANEOUS)
APPLIER CLIP ROT 10 11.4 M/L (STAPLE)
BAG COUNTER SPONGE SURGICOUNT (BAG) IMPLANT
BENZOIN TINCTURE PRP APPL 2/3 (GAUZE/BANDAGES/DRESSINGS) IMPLANT
BLADE SURG 15 STRL LF DISP TIS (BLADE) ×1 IMPLANT
BLADE SURG 15 STRL SS (BLADE) ×1
BLADE SURG SZ11 CARB STEEL (BLADE) ×2 IMPLANT
BNDG ADH 1X3 SHEER STRL LF (GAUZE/BANDAGES/DRESSINGS) ×12 IMPLANT
CABLE HIGH FREQUENCY MONO STRZ (ELECTRODE) ×2 IMPLANT
CHLORAPREP W/TINT 26 (MISCELLANEOUS) ×2 IMPLANT
CLIP APPLIE 5 13 M/L LIGAMAX5 (MISCELLANEOUS) IMPLANT
CLIP APPLIE ROT 10 11.4 M/L (STAPLE) IMPLANT
CLIP LIGATING HEMO O LOK GREEN (MISCELLANEOUS) IMPLANT
COVER MAYO STAND XLG (MISCELLANEOUS) ×2 IMPLANT
COVER SURGICAL LIGHT HANDLE (MISCELLANEOUS) ×2 IMPLANT
DERMABOND ADVANCED (GAUZE/BANDAGES/DRESSINGS)
DERMABOND ADVANCED .7 DNX12 (GAUZE/BANDAGES/DRESSINGS) IMPLANT
DEVICE SUTURE ENDOST 10MM (ENDOMECHANICALS) IMPLANT
DISSECTOR BLUNT TIP ENDO 5MM (MISCELLANEOUS) IMPLANT
DRAPE C-ARM 42X120 X-RAY (DRAPES) IMPLANT
DRAPE UTILITY XL STRL (DRAPES) ×2 IMPLANT
DRSG TEGADERM 2-3/8X2-3/4 SM (GAUZE/BANDAGES/DRESSINGS) IMPLANT
ELECT REM PT RETURN 15FT ADLT (MISCELLANEOUS) ×2 IMPLANT
GAUZE SPONGE 2X2 8PLY STRL LF (GAUZE/BANDAGES/DRESSINGS) IMPLANT
GLOVE SRG 8 PF TXTR STRL LF DI (GLOVE) ×1 IMPLANT
GLOVE SURG MICRO LTX SZ7.5 (GLOVE) ×2 IMPLANT
GLOVE SURG POLY ORTHO LF SZ7.5 (GLOVE) ×2 IMPLANT
GLOVE SURG UNDER POLY LF SZ8 (GLOVE) ×1
GOWN STRL REUS W/ TWL XL LVL3 (GOWN DISPOSABLE) ×3 IMPLANT
GOWN STRL REUS W/TWL XL LVL3 (GOWN DISPOSABLE) ×3
GRASPER SUT TROCAR 14GX15 (MISCELLANEOUS) IMPLANT
HEMOSTAT ARISTA ABSORB 3G PWDR (HEMOSTASIS) IMPLANT
HEMOSTAT SNOW SURGICEL 2X4 (HEMOSTASIS) IMPLANT
IRRIG SUCT STRYKERFLOW 2 WTIP (MISCELLANEOUS) ×2
IRRIGATION SUCT STRKRFLW 2 WTP (MISCELLANEOUS) ×1 IMPLANT
KIT BASIN OR (CUSTOM PROCEDURE TRAY) ×2 IMPLANT
KIT TURNOVER KIT A (KITS) IMPLANT
L-HOOK LAP DISP 36CM (ELECTROSURGICAL)
LHOOK LAP DISP 36CM (ELECTROSURGICAL) IMPLANT
MAT PREVALON FULL STRYKER (MISCELLANEOUS) ×2 IMPLANT
NDL SPNL 22GX3.5 QUINCKE BK (NEEDLE) ×1 IMPLANT
NEEDLE SPNL 22GX3.5 QUINCKE BK (NEEDLE) ×2 IMPLANT
NS IRRIG 1000ML POUR BTL (IV SOLUTION) ×2 IMPLANT
PACK UNIVERSAL I (CUSTOM PROCEDURE TRAY) ×2 IMPLANT
PENCIL SMOKE EVACUATOR (MISCELLANEOUS) IMPLANT
POUCH RETRIEVAL ECOSAC 10 (ENDOMECHANICALS) ×1 IMPLANT
POUCH RETRIEVAL ECOSAC 10MM (ENDOMECHANICALS) ×1
SCISSORS LAP 5X35 DISP (ENDOMECHANICALS) ×2 IMPLANT
SET CHOLANGIOGRAPH MIX (MISCELLANEOUS) IMPLANT
SET TUBE SMOKE EVAC HIGH FLOW (TUBING) ×2 IMPLANT
SHEARS HARMONIC ACE PLUS 36CM (ENDOMECHANICALS) IMPLANT
SLEEVE XCEL OPT CAN 5 100 (ENDOMECHANICALS) ×6 IMPLANT
SOL ANTI FOG 6CC (MISCELLANEOUS) ×1 IMPLANT
SOLUTION ANTI FOG 6CC (MISCELLANEOUS) ×1
SPIKE FLUID TRANSFER (MISCELLANEOUS) ×2 IMPLANT
SPONGE GAUZE 2X2 STER 10/PKG (GAUZE/BANDAGES/DRESSINGS)
SPONGE T-LAP 18X18 ~~LOC~~+RFID (SPONGE) ×2 IMPLANT
STRIP CLOSURE SKIN 1/2X4 (GAUZE/BANDAGES/DRESSINGS) IMPLANT
SUT MNCRL AB 4-0 PS2 18 (SUTURE) ×2 IMPLANT
SUT SILK 0 (SUTURE) ×1
SUT SILK 0 30XBRD TIE 6 (SUTURE) ×1 IMPLANT
SUT VIC AB 0 UR5 27 (SUTURE) IMPLANT
SUT VIC AB 2-0 SH 27 (SUTURE) ×1
SUT VIC AB 2-0 SH 27X BRD (SUTURE) ×1 IMPLANT
SUT VICRYL 0 TIES 12 18 (SUTURE) IMPLANT
SUT VICRYL 0 UR6 27IN ABS (SUTURE) IMPLANT
SYR 20ML LL LF (SYRINGE) ×2 IMPLANT
TOWEL OR 17X26 10 PK STRL BLUE (TOWEL DISPOSABLE) ×2 IMPLANT
TOWEL OR NON WOVEN STRL DISP B (DISPOSABLE) ×2 IMPLANT
TRAY LAPAROSCOPIC (CUSTOM PROCEDURE TRAY) ×2 IMPLANT
TROCAR BLADELESS 15MM (ENDOMECHANICALS) IMPLANT
TROCAR BLADELESS OPT 5 100 (ENDOMECHANICALS) ×2 IMPLANT
TROCAR XCEL BLUNT TIP 100MML (ENDOMECHANICALS) IMPLANT
TROCAR XCEL NON-BLD 11X100MML (ENDOMECHANICALS) IMPLANT

## 2021-05-10 NOTE — Anesthesia Procedure Notes (Signed)
Procedure Name: Intubation ?Date/Time: 05/10/2021 7:43 AM ?Performed by: Talbot Grumbling, CRNA ?Pre-anesthesia Checklist: Patient identified, Emergency Drugs available, Suction available and Patient being monitored ?Patient Re-evaluated:Patient Re-evaluated prior to induction ?Oxygen Delivery Method: Circle system utilized ?Preoxygenation: Pre-oxygenation with 100% oxygen ?Induction Type: IV induction, Rapid sequence and Cricoid Pressure applied ?Laryngoscope Size: Mac and 3 ?Grade View: Grade I ?Tube type: Oral ?Tube size: 7.5 mm ?Number of attempts: 1 ?Airway Equipment and Method: Stylet ?Placement Confirmation: ETT inserted through vocal cords under direct vision, positive ETCO2 and breath sounds checked- equal and bilateral ?Secured at: 21 cm ?Tube secured with: Tape ?Dental Injury: Teeth and Oropharynx as per pre-operative assessment  ? ? ? ? ?

## 2021-05-10 NOTE — H&P (Signed)
PROVIDER: Eithel Ryall Sherril Cong, MD ? ?MRN: [id number] ?DOB: October 23, 1980 ?DATE OF ENCOUNTER: 04/28/2021 ?Subjective  ? ?Chief Complaint: biliary dyskenesia ? ? ?History of Present Illness: ?Carrie Buck is a 41 y.o. female who is seen today for to discuss lap band removal. She saw one of my partners earlier this month for biliary dyskinesia. She also has a history of a lap band placed in [place] in 2010. She underwent lap appendectomy by Dr. Ezzard Standing in 2019. She has no fluid in her band. She is interested in lap band removal as she is struggled with success with her adjustable gastric band. ? ?She saw my partner a few weeks ago to discuss her right upper quadrant pain with radiation to her back with associated bloating and nausea. She had been evaluated by the gastroenterologist as well as by the ER. She has had ultrasound and CT. She ultimately had a nuclear medicine scan which revealed a biliary dyskinesia with a gallbladder ejection fraction of 23%.. ? ?Review of Systems: ?A complete review of systems was obtained from the patient. I have reviewed this information and discussed as appropriate with the patient. See HPI as well for other ROS. ? ?ROS  ? ?Medical History: ?Past Medical History:  ?Diagnosis Date  ? Anxiety  ? ?Patient Active Problem List  ?Diagnosis  ? Gastroesophageal reflux disease  ? Insulin resistance  ? Menorrhagia with regular cycle  ? Class 3 severe obesity with serious comorbidity and body mass index (BMI) of 40.0 to 44.9 in adult (CMS-HCC)  ? Anxiety  ? Prediabetes  ? ?Past Surgical History:  ?Procedure Laterality Date  ? Lap Band 2009  ? CESAREAN SECTION 2015  ? REPEAT CESAREAN SECTION 2017  ? APPENDECTOMY  ? ? ?Allergies  ?Allergen Reactions  ? Penicillins Rash and Unknown  ?Has patient had a PCN reaction causing immediate rash, facial/tongue/throat swelling, SOB or lightheadedness with hypotension: Yes ?Has patient had a PCN reaction causing severe rash involving mucus membranes or skin  necrosis: No ?Has patient had a PCN reaction that required hospitalization: No ?Has patient had a PCN reaction occurring within the last 10 years: No ?If all of the above answers are "NO", then may proceed with Cephalosporin use. ?Has patient had a PCN reaction causing immediate rash, facial/tongue/throat swelling, SOB or lightheadedness with hypotension: Yes ?Has patient had a PCN reaction causing severe rash involving mucus membranes or skin necrosis: No ?Has patient had a PCN reaction that required hospitalization: No ?Has patient had a PCN reaction occurring within the last 10 years: No ?If all of the above answers are "NO", then may proceed with Cephalosporin use. ? ? ?Current Outpatient Medications on File Prior to Visit  ?Medication Sig Dispense Refill  ? FLUoxetine (PROZAC) 20 MG capsule Take 1 capsule by mouth once daily  ? metFORMIN (GLUCOPHAGE) 500 MG tablet 1 tablet with a meal  ? norgestrel-ethinyl estradioL (LOW-OGESTREL, 28,) 0.3-30 mg-mcg tablet TAKE 1 TABLET BY MOUTH EVERY DAY FOR 28 DAYS  ? ?No current facility-administered medications on file prior to visit.  ? ?Family History  ?Problem Relation Age of Onset  ? Diabetes Mother  ? Obesity Mother  ? Skin cancer Father  ? ? ?Social History  ? ?Tobacco Use  ?Smoking Status Never  ?Smokeless Tobacco Never  ? ? ?Social History  ? ?Socioeconomic History  ? Marital status: Married  ?Tobacco Use  ? Smoking status: Never  ? Smokeless tobacco: Never  ?Substance and Sexual Activity  ? Alcohol use: Yes  ?Comment:  1-2 weekly  ? Drug use: Never  ? ?Objective:  ? ?There were no vitals filed for this visit.  ?There is no height or weight on file to calculate BMI. ? ?Gen: alert, NAD, non-toxic appearing; severe obesity ?Pupils: equal, no scleral icterus ?Pulm: Lungs clear to auscultation, symmetric chest rise ?CV: regular rate and rhythm ?Abd: soft, nontender, nondistended. old trocar sites. No cellulitis. No incisional hernia; palpable port LUQ ?Ext: no edema,   ?Skin: no rash, no jaundice ? ?Labs, Imaging and Diagnostic Testing: ? ?CLINICAL DATA:  Postprandial right upper quadrant abdominal pain, ?nausea and vomiting ?  ?EXAM: ?NUCLEAR MEDICINE HEPATOBILIARY IMAGING WITH GALLBLADDER EF ?  ?TECHNIQUE: ?Sequential images of the abdomen were obtained out to 60 minutes ?following intravenous administration of radiopharmaceutical. After ?oral ingestion of Ensure, gallbladder ejection fraction was ?determined. At 60 min, normal ejection fraction is greater than 33%. ?  ?RADIOPHARMACEUTICALS:  5.0 mCi [id number]  Choletec IV ?  ?COMPARISON:  Ultrasound March 04, 2021 ?  ?FINDINGS: ?Prompt uptake and biliary excretion of activity by the liver is ?seen. Gallbladder activity is visualized, consistent with patency of ?cystic duct. Biliary activity passes into small bowel, consistent ?with patent common bile duct. ?  ?Calculated gallbladder ejection fraction is 23%. (Normal gallbladder ?ejection fraction with Ensure is greater than 33%.) ?  ?IMPRESSION: ?Reduced gallbladder ejection fraction as can be seen with chronic ?cholecystitis/biliary dyskinesia. ?  ?  ?Electronically Signed ?  By: Maudry MayhewJeffrey  Waltz M.D. ?  On: 03/24/2021 15:37 ?  ?CLINICAL DATA:  Acute abdominal pain. ?  ?EXAM: ?CT ABDOMEN AND PELVIS WITH CONTRAST ?  ?TECHNIQUE: ?Multidetector CT imaging of the abdomen and pelvis was performed ?using the standard protocol following bolus administration of ?intravenous contrast. ?  ?RADIATION DOSE REDUCTION: This exam was performed according to the ?departmental dose-optimization program which includes automated ?exposure control, adjustment of the mA and/or kV according to ?patient size and/or use of iterative reconstruction technique. ?  ?CONTRAST:  100mL OMNIPAQUE IOHEXOL 300 MG/ML  SOLN ?  ?COMPARISON:  Abdominal ultrasound 03/04/2021. CT abdomen and pelvis ?03/25/2017. ?  ?FINDINGS: ?Lower chest: No acute abnormality. ?  ?Hepatobiliary: No focal liver abnormality is seen. No  gallstones, ?gallbladder wall thickening, or biliary dilatation. ?  ?Pancreas: Unremarkable. No pancreatic ductal dilatation or ?surrounding inflammatory changes. ?  ?Spleen: Mildly enlarged, unchanged. ?  ?Adrenals/Urinary Tract: There are punctate nonobstructing bilateral ?renal calculi. No hydronephrosis. Bladder and adrenal glands are ?within normal limits. ?  ?Stomach/Bowel: Gastric band is again seen in the proximal stomach. ?Stomach is otherwise within normal limits. No evidence of bowel wall ?thickening, distention, or inflammatory changes. The appendix is ?surgically absent. ?  ?Vascular/Lymphatic: No significant vascular findings are present. No ?enlarged abdominal or pelvic lymph nodes. ?  ?Reproductive: Uterus and bilateral adnexa are unremarkable. ?  ?Other: Postsurgical changes in the abdominal wall at the level of ?the umbilicus likely related to prior hernia repair. No evidence for ?abdominal wall hernia or ascites. ?  ?Musculoskeletal: No acute or significant osseous findings. ?  ?IMPRESSION: ?1. No acute localizing process in the abdomen or pelvis. ?2. Stable mild splenomegaly. ?3. Stable nonobstructing bilateral renal calculi. ?4. Gastric band present. ?5. Status post appendectomy. ?  ?  ?Electronically Signed ?  By: Darliss CheneyAmy  Guttmann M.D. ?  On: 03/05/2021 00:13 ?  ?CLINICAL DATA:  Right upper quadrant and epigastric pain. ?  ?EXAM: ?ULTRASOUND ABDOMEN LIMITED RIGHT UPPER QUADRANT ?  ?COMPARISON:  CT abdomen and pelvis 03/25/2017. ?  ?FINDINGS: ?Gallbladder: ?  ?  No gallstones or wall thickening visualized. Positive sonographic ?Murphy sign noted by sonographer. ?  ?Common bile duct: ?  ?Diameter: 2.9 mm ?  ?Liver: ?  ?No focal lesion identified. Increase in parenchymal echogenicity. ?Portal vein is patent on color Doppler imaging with normal direction ?of blood flow towards the liver. ?  ?Other: None. ?  ?IMPRESSION: ?1. Sonographer notes positive sonographic Murphy sign; however, ?there is no  additional sonographic evidence for cholelithiasis or ?acute cholecystitis. Please correlate clinically. ?2. Echogenic liver may be related to diffuse hepatocellular disease ?such as fatty infiltration. ? ?Assessm

## 2021-05-10 NOTE — Discharge Instructions (Signed)
[facility] LAPAROSCOPIC SURGERY: POST OP INSTRUCTIONS Always review your discharge instruction sheet given to you by the facility where your surgery was performed. IF YOU HAVE DISABILITY OR FAMILY LEAVE FORMS, YOU MUST BRING THEM TO THE OFFICE FOR PROCESSING.   DO NOT GIVE THEM TO YOUR DOCTOR.  PAIN CONTROL  First take acetaminophen (Tylenol) AND/or ibuprofen (Advil) to control your pain after surgery.  Follow directions on package.  Taking acetaminophen (Tylenol) and/or ibuprofen (Advil) regularly after surgery will help to control your pain and lower the amount of prescription pain medication you may need.  You should not take more than 3,000 mg (3 grams) of acetaminophen (Tylenol) in 24 hours.  You should not take ibuprofen (Advil), aleve, motrin, naprosyn or other NSAIDS if you have a history of stomach ulcers or chronic kidney disease.  A prescription for pain medication may be given to you upon discharge.  Take your pain medication as prescribed, if you still have uncontrolled pain after taking acetaminophen (Tylenol) or ibuprofen (Advil). Use ice packs to help control pain. If you need a refill on your pain medication, please contact your pharmacy.  They will contact our office to request authorization. Prescriptions will not be filled after 5pm or on week-ends.  HOME MEDICATIONS Take your usually prescribed medications unless otherwise directed.  DIET You should follow a light diet the first few days after arrival home.  Be sure to include lots of fluids daily. Avoid fatty, fried foods.   CONSTIPATION It is common to experience some constipation after surgery and if you are taking pain medication.  Increasing fluid intake and taking a stool softener (such as Colace) will usually help or prevent this problem from occurring.  A mild laxative (Milk of Magnesia or Miralax) should be taken according to package instructions if there are no bowel movements after 48  hours.  WOUND/INCISION CARE Most patients will experience some swelling and bruising in the area of the incisions.  Ice packs will help.  Swelling and bruising can take several days to resolve.  Unless discharge instructions indicate otherwise, follow guidelines below  STERI-STRIPS - you may remove your outer bandages 48 hours after surgery, and you may shower at that time.  You have steri-strips (small skin tapes) in place directly over the incision.  These strips should be left on the skin for 7-10 days.   DERMABOND/SKIN GLUE - you may shower in 24 hours.  The glue will flake off over the next 2-3 weeks. Any sutures or staples will be removed at the office during your follow-up visit.  ACTIVITIES You may resume regular (light) daily activities beginning the next day--such as daily self-care, walking, climbing stairs--gradually increasing activities as tolerated.  You may have sexual intercourse when it is comfortable.  Refrain from any heavy lifting or straining until approved by your doctor. You may drive when you are no longer taking prescription pain medication, you can comfortably wear a seatbelt, and you can safely maneuver your car and apply brakes.  FOLLOW-UP You should see your doctor in the office for a follow-up appointment approximately 2-3 weeks after your surgery.  You should have been given your post-op/follow-up appointment when your surgery was scheduled.  If you did not receive a post-op/follow-up appointment, make sure that you call for this appointment within a day or two after you arrive home to insure a convenient appointment time.  OTHER INSTRUCTIONS   WHEN TO CALL YOUR DOCTOR: Fever over 101.0 Inability to urinate Continued   bleeding from incision. Increased pain, redness, or drainage from the incision. Increasing abdominal pain  The clinic staff is available to answer your questions during regular business hours.  Please don't hesitate to call and ask to speak to  one of the nurses for clinical concerns.  If you have a medical emergency, go to the nearest emergency room or call 911.  A surgeon from [facility] is always on call at the hospital. [address], [address], [place], [place]  [address] ? [address] [address], [place], [place]   [address] [phone] ? [phone] ? FAX [phone] Web site: [email or web address]  

## 2021-05-10 NOTE — Op Note (Signed)
Carrie DueJessica A Hensarling ?[id number] ?12/06/1980 ?05/10/2021 ? ?Laparoscopic Cholecystectomy with ICG dye immunofluorescence and laparoscopic removal of adjustable gastric band procedure Note ? ?Indications: This patient presents with symptomatic gallbladder disease and will undergo laparoscopic cholecystectomy.  Patient had biliary dyskinesia as demonstrated on a HIDA scan.  patient also has a remote history of a laparoscopic adjustable gastric band who desires removal ? ?Pre-operative Diagnosis: Biliary dyskinesia, undesired laparoscopic adjustable gastric band ? ?Post-operative Diagnosis: Same ? ?Surgeon: Gaynelle AduEric Treyshon Buchanon MD FACS ? ?Surgical Resident: Benna DunksAnna Louie MD PGY-3 ? ?Anesthesia: General endotracheal anesthesia ? ? ?Procedure Details  ?The patient was seen again in the Holding Room. The risks, benefits, complications, treatment options, and expected outcomes were discussed with the patient. The possibilities of reaction to medication, pulmonary aspiration, perforation of viscus, bleeding, recurrent infection, finding a normal gallbladder, the need for additional procedures, failure to diagnose a condition, the possible need to convert to an open procedure, and creating a complication requiring transfusion or operation were discussed with the patient. The likelihood of improving the patient's symptoms with return to their baseline status is good.  The patient and/or family concurred with the proposed plan, giving informed consent. The site of surgery properly noted. The patient was taken to Operating Room, identified as Carrie Buck and the procedure verified as Laparoscopic Cholecystectomy with possible Intraoperative Cholangiogram and laparoscopic adjustable gastric band removal. A Time Out was held and the above information confirmed. Antibiotic prophylaxis was administered.  ? ?Prior to the induction of general anesthesia, antibiotic prophylaxis was administered. General endotracheal anesthesia was then  administered and tolerated well. After the induction, the abdomen was prepped with Chloraprep and draped in the sterile fashion. The patient was positioned in the supine position. ? ?Local anesthetic agent was injected into the skin near the umbilicus and an incision made. We dissected down to the abdominal fascia with blunt dissection.  The fascia was incised vertically and we entered the peritoneal cavity bluntly.  A pursestring suture of 0-Vicryl was placed around the fascial opening.  The Hasson cannula was inserted and secured with the stay suture.  Pneumoperitoneum was then created with CO2 and tolerated well without any adverse changes in the patient's vital signs. An 5-mm port was placed in the subxiphoid position.  Two 5-mm ports were placed in the right upper quadrant. All skin incisions were infiltrated with a local anesthetic agent before making the incision and placing the trocars.  ? ?We positioned the patient in reverse Trendelenburg, tilted slightly to the patient's left.  The gallbladder was identified, the fundus grasped and retracted cephalad. Adhesions were lysed bluntly and with the electrocautery where indicated, taking care not to injure any adjacent organs or viscus. The infundibulum was grasped and retracted laterally, exposing the peritoneum overlying the triangle of Calot. This was then divided and exposed in a blunt fashion. A critical view of the cystic duct and cystic artery was obtained.  However the cystic artery was very tiny.  There were no other structures entering the gallbladder.  The cystic duct was clearly identified and bluntly dissected circumferentially.  Near infrared immunofluorescence was activated showing opacification of the cystic duct entering the gallbladder going down toward the common bile duct. ?The cystic duct was then ligated with clips and divided. The cystic artery which had been identified & dissected free was ligated with clips and divided as well.  ? ?The  gallbladder was dissected from the liver bed in retrograde fashion with the electrocautery.  There was some spillage of  bile from the gallbladder.  We identified a posterior branch of the cystic artery in the gallbladder fossa as we were mobilizing the gallbladder up out of the gallbladder fossa.  It was clipped and transected distally with hook electrocautery.  the gallbladder was removed and placed in an Ecco sac.  The gallbladder and Ecco sac were then removed through the umbilical port site. The liver bed was irrigated and inspected. Hemostasis was achieved with the electrocautery. Copious irrigation was utilized and was repeatedly aspirated until clear.   ? ?We then turned our attention to removing the adjustable gastric band.  A subxiphoid trocar was placed on the patient's left side to accommodate a Nathanson liver retractor hook.  The left lobe of the liver was retracted up exposing the adjustable gastric band.  There is the typical capsule over the band.  There is a little bit of adhesions from the undersurface of the left lobe of the liver to the lesser curvature of the stomach these were taken down with hook electrocautery.  I incised the capsule directly on top of the band.  The buckle was identified and released from the surrounding capsule.  I then unbuckled the band.  I then cut the band and brought it back from around the stomach.  The band tubing was then cut.  We then brought the adjustable gastric band out through the [facility] trocar.  We then replaced the trocar with back in the abdomen.  The stomach was inspected.  There was no evidence of injury to the stomach.  I did not release any more of the cicatrix.  The plication really did not appear to be intact either.  We reinspected the gallbladder fossa.  There is no evidence of bleeding.  I removed the [facility] trocar. ? ?The pursestring suture was used to close the umbilical fascia.  We closed the umbilical fascia with an additional interrupted 0  Vicryl using the PMI suture passer with laparoscopic assistance.  Additional local was infiltrated. ? ?We again inspected the right upper quadrant for hemostasis.  The umbilical closure was inspected and there was no air leak and nothing trapped within the closure. Pneumoperitoneum was released as we removed the trocars.  ? ?We then incised the skin over the port in the left midabdomen with a 15 blade.  Subcutaneous tissue was divided with electrocautery.  We dissected down toward the port which was sewn to the fascia.  The capsule around the port was incised with cautery.  The Prolene suture was removed.  We then remove the port and the attached tubing.  The entire Lap-Band apparatus had been accounted for.  We removed the remaining capsule from the bed.  Hemostasis was achieved.  We then closed the deep subcutaneous tissue with several interrupted 2-0 Vicryl sutures.  We freshen the upper superior skin edge with a 15 blade. ? ? 4-0 Monocryl was used to close the skin.    steri-strips, and clean dressings were applied. The patient was then extubated and brought to the recovery room in stable condition. Instrument, sponge, and needle counts were correct at closure and at the conclusion of the case.  ? ?Findings: ?+critical view ? ? ?Estimated Blood Loss: Minimal ?        ?Drains: none ?        ?Specimens: Gallbladder     ?      ?Complications: None; patient tolerated the procedure well. ?        ?Disposition: PACU - hemodynamically stable. ?        ?  Condition: stable ? ?I was personally present during the entire procedure and performed certain portions of this procedure and immediately available while the resident was closing the last skin incision as documented in my operative note. ? ? ?Mary Sella. Andrey Campanile, MD, FACS ?General, Bariatric, & Minimally Invasive Surgery ?[facility], [place] ? ?

## 2021-05-10 NOTE — Interval H&P Note (Signed)
History and Physical Interval Note: ? ?05/10/2021 ?7:16 AM ? ?Carrie Buck  has presented today for surgery, with the diagnosis of BILIARY DYSKINESIA AND UNDESIRED LAP BAND.  The various methods of treatment have been discussed with the patient and family. After consideration of risks, benefits and other options for treatment, the patient has consented to  Procedure(s): ?LAPAROSCOPIC CHOLECYSTECTOMY (N/A) ?LAPAROSCOPIC REMOVAL OF ADJUSTABLE GASTRIC BAND (N/A) as a surgical intervention.  The patient's history has been reviewed, patient examined, no change in status, stable for surgery.  I have reviewed the patient's chart and labs.  Questions were answered to the patient's satisfaction.   ? ?Patient states she has developed a rash under both breasts from heat, no fever ?Discussed surgical resident involvement with the case ? ?Gaynelle Adu ? ? ?

## 2021-05-10 NOTE — Anesthesia Postprocedure Evaluation (Signed)
Anesthesia Post Note ? ?Patient: LYNNDA HISSAM ? ?Procedure(s) Performed: LAPAROSCOPIC CHOLECYSTECTOMY (Abdomen) ?LAPAROSCOPIC REMOVAL OF ADJUSTABLE GASTRIC BAND (Abdomen) ? ?  ? ?Patient location during evaluation: PACU ?Anesthesia Type: General ?Level of consciousness: awake and alert and oriented ?Pain management: pain level controlled ?Vital Signs Assessment: post-procedure vital signs reviewed and stable ?Respiratory status: spontaneous breathing, nonlabored ventilation and respiratory function stable ?Cardiovascular status: blood pressure returned to baseline and stable ?Postop Assessment: no apparent nausea or vomiting ?Anesthetic complications: no ? ? ?No notable events documented. ? ?Last Vitals:  ?Vitals:  ? 05/10/21 1045 05/10/21 1115  ?BP: 131/78 (!) 152/82  ?Pulse: 76 64  ?Resp: (!) 21   ?Temp:    ?SpO2: 98% 98%  ?  ?Last Pain:  ?Vitals:  ? 05/10/21 1045  ?TempSrc:   ?PainSc: 0-No pain  ? ? ?  ?  ?  ?  ?  ?  ? ?Shalana Jardin A. ? ? ? ? ?

## 2021-05-10 NOTE — Transfer of Care (Signed)
Immediate Anesthesia Transfer of Care Note ? ?Patient: Carrie Buck ? ?Procedure(s) Performed: LAPAROSCOPIC CHOLECYSTECTOMY (Abdomen) ?LAPAROSCOPIC REMOVAL OF ADJUSTABLE GASTRIC BAND (Abdomen) ? ?Patient Location: PACU ? ?Anesthesia Type:General ? ?Level of Consciousness: awake, alert  and oriented ? ?Airway & Oxygen Therapy: Patient Spontanous Breathing and Patient connected to face mask oxygen ? ?Post-op Assessment: Report given to RN and Post -op Vital signs reviewed and stable ? ?Post vital signs: Reviewed and stable ? ?Last Vitals:  ?Vitals Value Taken Time  ?BP 149/87 05/10/21 0940  ?Temp    ?Pulse 85 05/10/21 0940  ?Resp 14 05/10/21 0940  ?SpO2 100 % 05/10/21 0940  ?Vitals shown include unvalidated device data. ? ?Last Pain:  ?Vitals:  ? 05/10/21 0545  ?TempSrc: Oral  ?PainSc:   ?   ? ?  ? ?Complications: No notable events documented. ?

## 2021-05-11 ENCOUNTER — Encounter (HOSPITAL_COMMUNITY): Payer: Self-pay | Admitting: General Surgery

## 2021-05-11 LAB — SURGICAL PATHOLOGY

## 2021-08-10 ENCOUNTER — Other Ambulatory Visit: Payer: Self-pay | Admitting: Physician Assistant

## 2021-08-10 DIAGNOSIS — Z1231 Encounter for screening mammogram for malignant neoplasm of breast: Secondary | ICD-10-CM

## 2021-09-11 ENCOUNTER — Encounter (HOSPITAL_COMMUNITY): Payer: Self-pay

## 2021-09-11 ENCOUNTER — Emergency Department (HOSPITAL_COMMUNITY)
Admission: EM | Admit: 2021-09-11 | Discharge: 2021-09-11 | Disposition: A | Discharge status: Home / Self Care | Payer: 59 | Attending: Emergency Medicine | Admitting: Emergency Medicine

## 2021-09-11 ENCOUNTER — Emergency Department (HOSPITAL_COMMUNITY): Payer: 59

## 2021-09-11 ENCOUNTER — Other Ambulatory Visit: Payer: Self-pay

## 2021-09-11 DIAGNOSIS — K439 Ventral hernia without obstruction or gangrene: Secondary | ICD-10-CM | POA: Insufficient documentation

## 2021-09-11 DIAGNOSIS — R109 Unspecified abdominal pain: Secondary | ICD-10-CM | POA: Diagnosis present

## 2021-09-11 LAB — LIPASE, BLOOD: Lipase: 33 U/L (ref 11–51)

## 2021-09-11 LAB — COMPREHENSIVE METABOLIC PANEL
ALT: 16 U/L (ref 0–44)
AST: 17 U/L (ref 15–41)
Albumin: 3.8 g/dL (ref 3.5–5.0)
Alkaline Phosphatase: 50 U/L (ref 38–126)
Anion gap: 7 (ref 5–15)
BUN: 11 mg/dL (ref 6–20)
CO2: 21 mmol/L — ABNORMAL LOW (ref 22–32)
Calcium: 8.9 mg/dL (ref 8.9–10.3)
Chloride: 109 mmol/L (ref 98–111)
Creatinine, Ser: 0.71 mg/dL (ref 0.44–1.00)
GFR, Estimated: >60 mL/min (ref >60)
Glucose, Bld: 86 mg/dL (ref 70–99)
Potassium: 3.9 mmol/L (ref 3.5–5.1)
Sodium: 137 mmol/L (ref 135–145)
Total Bilirubin: 0.4 mg/dL (ref 0.3–1.2)
Total Protein: 7.8 g/dL (ref 6.5–8.1)

## 2021-09-11 LAB — URINALYSIS, ROUTINE W REFLEX MICROSCOPIC
Bacteria, UA: NONE SEEN
Bilirubin Urine: NEGATIVE
Glucose, UA: NEGATIVE mg/dL
Ketones, ur: NEGATIVE mg/dL
Leukocytes,Ua: NEGATIVE
Nitrite: NEGATIVE
Protein, ur: NEGATIVE mg/dL
Specific Gravity, Urine: 1.019 (ref 1.005–1.030)
pH: 7 (ref 5.0–8.0)

## 2021-09-11 LAB — CBC
HCT: 40.3 % (ref 36.0–46.0)
Hemoglobin: 13.2 g/dL (ref 12.0–15.0)
MCH: 29.8 pg (ref 26.0–34.0)
MCHC: 32.8 g/dL (ref 30.0–36.0)
MCV: 91 fL (ref 80.0–100.0)
Platelets: 305 10*3/uL (ref 150–400)
RBC: 4.43 MIL/uL (ref 3.87–5.11)
RDW: 14 % (ref 11.5–15.5)
WBC: 8.1 10*3/uL (ref 4.0–10.5)
nRBC: 0 % (ref 0.0–0.2)

## 2021-09-11 LAB — HCG, QUANTITATIVE, PREGNANCY: hCG, Beta Chain, Quant, S: <1 m[IU]/mL (ref <5)

## 2021-09-11 LAB — LACTIC ACID, PLASMA: Lactic Acid, Venous: 1.1 mmol/L (ref 0.5–1.9)

## 2021-09-11 MED ORDER — IOHEXOL 300 MG/ML  SOLN
100.0000 mL | Freq: Once | INTRAMUSCULAR | Status: AC | PRN
  Administered 2021-09-11: 100 mL via INTRAVENOUS

## 2021-09-11 MED ORDER — SODIUM CHLORIDE (PF) 0.9 % IJ SOLN
INTRAMUSCULAR | Status: AC
  Filled 2021-09-11: qty 50

## 2021-09-11 NOTE — ED Provider Notes (Signed)
[facility]-EMERGENCY DEPT Provider Note   CSN: [id number] Arrival date & time: 09/11/21  1136     History  Chief Complaint  Patient presents with   Abdominal Pain    Carrie Buck is a 41 y.o. female with past medical history significant for previous appendicitis, gallbladder surgery, lap band surgery who presents with concern for ventral abdominal hernia with abdominal pain for over a week.  Patient reports that she saw her surgeon on Wednesday and he thought that it may be a hernia, had no concern for strangulation or incarceration at the time and ordered outpatient CT.  She reports that she has had increasing pain since then as well as discomfort.  She is reporting today for further evaluation and CT of her hernia.  At rest she reports that her pain is tolerable only 2-3 out of 10.  She denies any nausea, vomiting, fever, chills.   Abdominal Pain      Home Medications Prior to Admission medications   Medication Sig Start Date End Date Taking? Authorizing Provider  Ascorbic Acid (VITAMIN C) 500 MG CAPS Take 500 mg by mouth 3 (three) times a week.   Yes [provider]  diphenhydrAMINE (BENADRYL) 25 MG tablet Take 25 mg by mouth as needed for allergies.   Yes [provider]  FLUoxetine (PROZAC) 20 MG capsule Take 20 mg by mouth daily.   Yes [provider]  JUNEL FE 1.5/30 1.5-30 MG-MCG tablet Take 1 tablet by mouth daily. 12/29/20  Yes [provider]  Nutritional Supplements (VITAMIN D BOOSTER PO) Take 50,000 Units by mouth every 30 (thirty) days. REPLASTA   Yes [provider]  omeprazole (PRILOSEC) 20 MG capsule Take 20 mg by mouth daily as needed (acid reflux). 03/10/21  Yes [provider]  oxyCODONE (OXY IR/ROXICODONE) 5 MG immediate release tablet Take 1 tablet (5 mg total) by mouth every 6 (six) hours as needed for severe pain. 05/10/21  Yes Gaynelle Adu, MD  vitamin E 180 MG (400 UNITS) capsule Take  400 Units by mouth 2 (two) times a week.   Yes [provider]      Allergies    Penicillins    Review of Systems   Review of Systems  Gastrointestinal:  Positive for abdominal pain.  All other systems reviewed and are negative.   Physical Exam Updated Vital Signs BP (!) 142/86   Pulse 88   Temp 98.2 F (36.8 C) (Oral)   Resp 18   Ht 5' 5.5" (1.664 m)   Wt 131.5 kg   SpO2 98%   BMI 47.52 kg/m  Physical Exam Vitals and nursing note reviewed.  Constitutional:      General: She is not in acute distress.    Appearance: Normal appearance.  HENT:     Head: Normocephalic and atraumatic.  Eyes:     General:        Right eye: No discharge.        Left eye: No discharge.  Cardiovascular:     Rate and Rhythm: Normal rate and regular rhythm.     Heart sounds: No murmur heard.    No friction rub. No gallop.  Pulmonary:     Effort: Pulmonary effort is normal.     Breath sounds: Normal breath sounds.  Abdominal:     General: Bowel sounds are normal.     Palpations: Abdomen is soft.     Comments: Appearance of ventral hernia more pronounced with standing  and Valsalva.  It does seem to spontaneously reduce when patient relaxed on the bed but it is large in size and difficult to fully assess.  Tenderness palpation throughout the abdomen, worse in upper quadrants.  No rebound, rigidity, guarding.  Skin:    General: Skin is warm and dry.     Capillary Refill: Capillary refill takes less than 2 seconds.  Neurological:     Mental Status: She is alert and oriented to person, place, and time.  Psychiatric:        Mood and Affect: Mood normal.        Behavior: Behavior normal.     ED Results / Procedures / Treatments   Labs (all labs ordered are listed, but only abnormal results are displayed) Labs Reviewed  COMPREHENSIVE METABOLIC PANEL - Abnormal; Notable for the following components:      Result Value   CO2 21 (*)    All other components within normal limits   LIPASE, BLOOD  CBC  LACTIC ACID, PLASMA  HCG, QUANTITATIVE, PREGNANCY  URINALYSIS, ROUTINE W REFLEX MICROSCOPIC    EKG None  Radiology CT ABDOMEN PELVIS W CONTRAST  Result Date: 09/11/2021 CLINICAL DATA:  Postoperative abdominal pain EXAM: CT ABDOMEN AND PELVIS WITH CONTRAST TECHNIQUE: Multidetector CT imaging of the abdomen and pelvis was performed using the standard protocol following bolus administration of intravenous contrast. RADIATION DOSE REDUCTION: This exam was performed according to the departmental dose-optimization program which includes automated exposure control, adjustment of the mA and/or kV according to patient size and/or use of iterative reconstruction technique. CONTRAST:  OMNIPAQUE IOHEXOL 300 MG/ML  SOLN COMPARISON:  03/05/2021 FINDINGS: Lower chest: No acute abnormality. Hepatobiliary: No focal liver abnormality is seen. Hepatic steatosis. Status post interval cholecystectomy. No biliary dilatation. Pancreas: Unremarkable. No pancreatic ductal dilatation or surrounding inflammatory changes. Spleen: Normal in size without significant abnormality. Adrenals/Urinary Tract: Adrenal glands are unremarkable. Multiple small bilateral nonobstructive renal calculi. Ureteral calculi hydronephrosis. Bladder is unremarkable. Stomach/Bowel: Stomach is within normal limits. Status post appendectomy. No evidence of bowel wall thickening, distention, or inflammatory changes. Vascular/Lymphatic: No significant vascular findings are present. No enlarged abdominal or pelvic lymph nodes. Reproductive: No mass or other significant abnormality. Other: New midline ventral hernia containing a single nonobstructed loop of patulous sigmoid colon (series 2, image 50) no ascites. Musculoskeletal: No acute or significant osseous findings. IMPRESSION: 1. Status post interval cholecystectomy. 2. New midline ventral hernia containing a single nonobstructed loop of patulous sigmoid colon. 3. Multiple small  bilateral nonobstructive renal calculi. No ureteral calculi or hydronephrosis. 4. Hepatic steatosis. Electronically Signed   By: Jearld Lesch M.D.   On: 09/11/2021 14:44    Procedures Procedures    Medications Ordered in ED Medications  sodium chloride (PF) 0.9 % injection (has no administration in time range)  iohexol (OMNIPAQUE) 300 MG/ML solution 100 mL (100 mLs Intravenous Contrast Given 09/11/21 1417)    ED Course/ Medical Decision Making/ A&P                           Medical Decision Making Amount and/or Complexity of Data Reviewed Labs: ordered. Radiology: ordered.  Risk Prescription drug management.   This patient is a 41 y.o. female who presents to the ED for concern of intra-abdominal pain, concern for ventral hernia, this involves an extensive number of treatment options, and is a complaint that carries with it a high risk of complications and morbidity. The emergent differential diagnosis prior  to evaluation includes, but is not limited to, incarcerated or strangulated hernia, acute mesenteric ischemia, pancreatitis, cholecystitis, cholangitis, gastritis, gastroenteritis, also considered UTI, pyelonephritis, diverticulitis, other GU or GI abnormalities.   This is not an exhaustive differential.   Past Medical History / Co-morbidities / Social History: Previous cholecystectomy, appendectomy, obesity, lap band surgery  Additional history: Chart reviewed. Pertinent results include: Reviewed recent surgical operation note, as well as outpatient evaluation with surgery  Physical Exam: Physical exam performed. The pertinent findings include: Patient with physical exam findings that are consistent with large ventral hernia, it does seem to easily reduce my exam, I see no evidence of acute incarceration, strangulation at this time.  Lab Tests: I ordered, and personally interpreted labs.  The pertinent results include: CMP overall unremarkable, very mild bicarb deficit 21.   Lactic acid normal x1.  Lipase unremarkable.  CBC unremarkable with no leukocytosis or clinically significant anemia.  Patient not pregnant at this time.   Imaging Studies: I ordered imaging studies including CT abdomen pelvis. I independently visualized and interpreted imaging which showed does note large ventral hernia, no evidence of incarceration, strangulation, fat stranding.  No other intra-abdominal findings to explain patient's symptoms at this time.  I agree with the radiologist interpretation.   Disposition: After consideration of the diagnostic results and the patients response to treatment, I feel that patient's presentation is consistent with a simple ventral hernia without obstruction, gangrene, incarceration, strangulation.   emergency department workup does not suggest an emergent condition requiring admission or immediate intervention beyond what has been performed at this time. The plan is: Surgeon for outpatient surgical repair. The patient is safe for discharge and has been instructed to return immediately for worsening symptoms, change in symptoms or any other concerns.  I discussed this case with my attending physician Dr. Durwin Nora who cosigned this note including patient's presenting symptoms, physical exam, and planned diagnostics and interventions. Attending physician stated agreement with plan or made changes to plan which were implemented.    Final Clinical Impression(s) / ED Diagnoses Final diagnoses:  Ventral hernia without obstruction or gangrene    Rx / DC Orders ED Discharge Orders     None         [place] 09/11/21 1540    Gloris Manchester, MD 09/12/21 [phone]

## 2021-09-11 NOTE — ED Triage Notes (Signed)
Pt c/o upper abdominal pain for over a week. Pt is concerned she has developed a hernia from her recent gallbladder surgery in April. She saw her Surgeon on Wednesday and he reportedly believes it is a hernia so an outpatient ct was ordered. Pt states discomfort has worsened since seeing him.

## 2021-09-11 NOTE — Discharge Instructions (Signed)
As we discussed your CT shows a ventral hernia, but without any signs of obstruction, gangrene, incarceration, strangulation.  You can use an abdominal band to help with discomfort and bulging, but monitor for signs of worsening pain, nausea, vomiting, fever, chills.  Please return to the emergency department if you have any of these worrisome signs or symptoms.  I recommend that you follow-up with your surgeon to discuss repair of your hernia at your earliest convenience.  If you have discomfort you can use ibuprofen, Tylenol for pain.

## 2021-09-12 ENCOUNTER — Other Ambulatory Visit: Payer: Self-pay | Admitting: General Surgery

## 2021-09-12 DIAGNOSIS — K432 Incisional hernia without obstruction or gangrene: Secondary | ICD-10-CM

## 2021-09-14 ENCOUNTER — Encounter (INDEPENDENT_AMBULATORY_CARE_PROVIDER_SITE_OTHER): Payer: Self-pay

## 2021-11-04 ENCOUNTER — Ambulatory Visit: Payer: Self-pay | Admitting: General Surgery

## 2021-11-04 DIAGNOSIS — E88819 Insulin resistance, unspecified: Secondary | ICD-10-CM

## 2021-11-04 NOTE — Progress Notes (Signed)
Sent message, via epic in basket, requesting orders in epic from surgeon.  

## 2021-11-07 ENCOUNTER — Ambulatory Visit
Admission: RE | Admit: 2021-11-07 | Discharge: 2021-11-07 | Disposition: A | Discharge status: Home / Self Care | Payer: 59 | Source: Ambulatory Visit | Attending: Physician Assistant | Admitting: Physician Assistant

## 2021-11-07 DIAGNOSIS — Z1231 Encounter for screening mammogram for malignant neoplasm of breast: Secondary | ICD-10-CM

## 2021-11-09 ENCOUNTER — Other Ambulatory Visit: Payer: Self-pay | Admitting: Physician Assistant

## 2021-11-09 DIAGNOSIS — R928 Other abnormal and inconclusive findings on diagnostic imaging of breast: Secondary | ICD-10-CM

## 2021-11-10 NOTE — Progress Notes (Addendum)
COVID Vaccine Completed: yes   Date of COVID positive in last 90 days: no  PCP - [place] Cardiologist - n/a  Chest x-ray - 03/14/21 Epic EKG - 05/03/21 Epic Stress Test - n/a ECHO - n/a Cardiac Cath - n/a Pacemaker/ICD device last checked: n/a Spinal Cord Stimulator: n/a  Bowel Prep - no  Sleep Study - n/a CPAP -   Fasting Blood Sugar - no per pt Checks Blood Sugar _____ times a day  Blood Thinner Instructions: n/a Aspirin Instructions: Last Dose:  Activity level: Can go up a flight of stairs and perform activities of daily living without stopping and without symptoms of chest pain or shortness of breath.    Anesthesia review: heart murmur as child, pt reports she grew out of it   Patient denies shortness of breath, fever, cough and chest pain at PAT appointment  Patient verbalized understanding of instructions that were given to them at the PAT appointment. Patient was also instructed that they will need to review over the PAT instructions again at home before surgery.

## 2021-11-11 ENCOUNTER — Encounter (HOSPITAL_COMMUNITY)
Admission: RE | Admit: 2021-11-11 | Discharge: 2021-11-11 | Disposition: A | Discharge status: Home / Self Care | Payer: 59 | Source: Ambulatory Visit | Attending: General Surgery | Admitting: General Surgery

## 2021-11-11 ENCOUNTER — Encounter (HOSPITAL_COMMUNITY): Payer: Self-pay

## 2021-11-11 DIAGNOSIS — E88819 Insulin resistance, unspecified: Secondary | ICD-10-CM | POA: Diagnosis not present

## 2021-11-11 DIAGNOSIS — Z01812 Encounter for preprocedural laboratory examination: Secondary | ICD-10-CM | POA: Insufficient documentation

## 2021-11-11 HISTORY — DX: Personal history of urinary calculi: Z87.442

## 2021-11-11 LAB — COMPREHENSIVE METABOLIC PANEL
ALT: 25 U/L (ref 0–44)
AST: 20 U/L (ref 15–41)
Albumin: 3.9 g/dL (ref 3.5–5.0)
Alkaline Phosphatase: 55 U/L (ref 38–126)
Anion gap: 11 (ref 5–15)
BUN: 14 mg/dL (ref 6–20)
CO2: 23 mmol/L (ref 22–32)
Calcium: 9.1 mg/dL (ref 8.9–10.3)
Chloride: 103 mmol/L (ref 98–111)
Creatinine, Ser: 0.85 mg/dL (ref 0.44–1.00)
GFR, Estimated: >60 mL/min (ref >60)
Glucose, Bld: 90 mg/dL (ref 70–99)
Potassium: 3.8 mmol/L (ref 3.5–5.1)
Sodium: 137 mmol/L (ref 135–145)
Total Bilirubin: 0.4 mg/dL (ref 0.3–1.2)
Total Protein: 7.7 g/dL (ref 6.5–8.1)

## 2021-11-11 LAB — CBC WITH DIFFERENTIAL/PLATELET
Abs Immature Granulocytes: 0.01 10*3/uL (ref 0.00–0.07)
Basophils Absolute: 0 10*3/uL (ref 0.0–0.1)
Basophils Relative: 0 %
Eosinophils Absolute: 0.1 10*3/uL (ref 0.0–0.5)
Eosinophils Relative: 1 %
HCT: 41.8 % (ref 36.0–46.0)
Hemoglobin: 13.3 g/dL (ref 12.0–15.0)
Immature Granulocytes: 0 %
Lymphocytes Relative: 43 %
Lymphs Abs: 3.5 10*3/uL (ref 0.7–4.0)
MCH: 29 pg (ref 26.0–34.0)
MCHC: 31.8 g/dL (ref 30.0–36.0)
MCV: 91.3 fL (ref 80.0–100.0)
Monocytes Absolute: 0.6 10*3/uL (ref 0.1–1.0)
Monocytes Relative: 7 %
Neutro Abs: 4 10*3/uL (ref 1.7–7.7)
Neutrophils Relative %: 49 %
Platelets: 291 10*3/uL (ref 150–400)
RBC: 4.58 MIL/uL (ref 3.87–5.11)
RDW: 13.2 % (ref 11.5–15.5)
WBC: 8.2 10*3/uL (ref 4.0–10.5)
nRBC: 0 % (ref 0.0–0.2)

## 2021-11-11 NOTE — Patient Instructions (Signed)
SURGICAL WAITING ROOM VISITATION Patients having surgery or a procedure may have no more than 2 support people in the waiting area - these visitors may rotate.   Children under the age of 55 must have an adult with them who is not the patient. If the patient needs to stay at the hospital during part of their recovery, the visitor guidelines for inpatient rooms apply. Pre-op nurse will coordinate an appropriate time for 1 support person to accompany patient in pre-op.  This support person may not rotate.    Please refer to the [facility] website for the visitor guidelines for Inpatients (after your surgery is over and you are in a regular room).    Your procedure is scheduled on: 11/23/21   Report to [facility] Main Entrance    Report to admitting at 6:15 AM   Call this number if you have problems the morning of surgery [phone]   Do not eat food :After Midnight.   After Midnight you may have the following liquids until 5:30 AM DAY OF SURGERY  Water Non-Citrus Juices (without pulp, NO RED) Carbonated Beverages Black Coffee (NO MILK/CREAM OR CREAMERS, sugar ok)  Clear Tea (NO MILK/CREAM OR CREAMERS, sugar ok) regular and decaf                             Plain Jell-O (NO RED)                                           Fruit ices (not with fruit pulp, NO RED)                                     Popsicles (NO RED)                                                               Sports drinks like Gatorade (NO RED)                 The day of surgery:  Drink ONE (1) Pre-Surgery Clear Ensure at 5:30 AM the morning of surgery. Drink in one sitting. Do not sip.  This drink was given to you during your hospital  pre-op appointment visit. Nothing else to drink after completing the  Pre-Surgery Clear Ensure.          If you have questions, please contact your surgeon's office.   FOLLOW BOWEL PREP AND ANY ADDITIONAL PRE OP INSTRUCTIONS YOU RECEIVED FROM YOUR SURGEON'S  OFFICE!!!     Oral Hygiene is also important to reduce your risk of infection.                                    Remember - BRUSH YOUR TEETH THE MORNING OF SURGERY WITH YOUR REGULAR TOOTHPASTE   Do NOT smoke after Midnight   Take these medicines the morning of surgery with A SIP OF WATER: Fluoxetine, Omeprazole  You may not have any metal on your body including hair pins, jewelry, and body piercing             Do not wear make-up, lotions, powders, perfumes, or deodorant  Do not wear nail polish including gel and S&S, artificial/acrylic nails, or any other type of covering on natural nails including finger and toenails. If you have artificial nails, gel coating, etc. that needs to be removed by a nail salon please have this removed prior to surgery or surgery may need to be canceled/ delayed if the surgeon/ anesthesia feels like they are unable to be safely monitored.   Do not shave  48 hours prior to surgery.    Do not bring valuables to the hospital. [place].   Contacts, dentures or bridgework may not be worn into surgery.   Bring small overnight bag day of surgery.   DO NOT [place]. PHARMACY WILL DISPENSE MEDICATIONS LISTED ON YOUR MEDICATION LIST TO YOU DURING YOUR ADMISSION [place]!   Special Instructions: Bring a copy of your healthcare power of attorney and living will documents the day of surgery if you haven't scanned them before.              Please read over the following fact sheets you were given: IF [place] [phone] Schneiders   If you received a COVID test during your pre-op visit  it is requested that you wear a mask when out in public, stay away from anyone that may not be feeling well and notify your surgeon if you develop symptoms. If you test positive for Covid or have been in contact  with anyone that has tested positive in the last 10 days please notify you surgeon.     [facility] - Preparing for Surgery Before surgery, you can play an important role.  Because skin is not sterile, your skin needs to be as free of germs as possible.  You can reduce the number of germs on your skin by washing with CHG (chlorahexidine gluconate) soap before surgery.  CHG is an antiseptic cleaner which kills germs and bonds with the skin to continue killing germs even after washing. Please DO NOT use if you have an allergy to CHG or antibacterial soaps.  If your skin becomes reddened/irritated stop using the CHG and inform your nurse when you arrive at Short Stay. Do not shave (including legs and underarms) for at least 48 hours prior to the first CHG shower.  You may shave your face/neck.  Please follow these instructions carefully:  1.  Shower with CHG Soap the night before surgery and the  morning of surgery.  2.  If you choose to wash your hair, wash your hair first as usual with your normal  shampoo.  3.  After you shampoo, rinse your hair and body thoroughly to remove the shampoo.                             4.  Use CHG as you would any other liquid soap.  You can apply chg directly to the skin and wash.  Gently with a scrungie or clean washcloth.  5.  Apply the CHG Soap to your body ONLY FROM THE NECK DOWN.   Do  not use on face/ open                           Wound or open sores. Avoid contact with eyes, ears mouth and   genitals (private parts).                       Wash face,  Genitals (private parts) with your normal soap.             6.  Wash thoroughly, paying special attention to the area where your    surgery  will be performed.  7.  Thoroughly rinse your body with warm water from the neck down.  8.  DO NOT shower/wash with your normal soap after using and rinsing off the CHG Soap.                9.  Pat yourself dry with a clean towel.            10.  Wear clean pajamas.             11.  Place clean sheets on your bed the night of your first shower and do not  sleep with pets. Day of Surgery : Do not apply any lotions/deodorants the morning of surgery.  Please wear clean clothes to the hospital/surgery center.  FAILURE TO FOLLOW THESE INSTRUCTIONS MAY RESULT IN THE CANCELLATION OF YOUR SURGERY  PATIENT SIGNATURE_________________________________  NURSE SIGNATURE__________________________________  ________________________________________________________________________

## 2021-11-18 ENCOUNTER — Ambulatory Visit: Payer: 59

## 2021-11-18 ENCOUNTER — Ambulatory Visit
Admission: RE | Admit: 2021-11-18 | Discharge: 2021-11-18 | Disposition: A | Discharge status: Home / Self Care | Payer: 59 | Source: Ambulatory Visit | Attending: Physician Assistant | Admitting: Physician Assistant

## 2021-11-18 DIAGNOSIS — R928 Other abnormal and inconclusive findings on diagnostic imaging of breast: Secondary | ICD-10-CM

## 2021-11-23 ENCOUNTER — Encounter (HOSPITAL_COMMUNITY)
Admission: RE | Disposition: A | Discharge status: Home / Self Care | Payer: Self-pay | Source: Home / Self Care | Attending: General Surgery

## 2021-11-23 ENCOUNTER — Ambulatory Visit (HOSPITAL_COMMUNITY): Payer: 59 | Admitting: Physician Assistant

## 2021-11-23 ENCOUNTER — Encounter (HOSPITAL_COMMUNITY): Payer: Self-pay | Admitting: General Surgery

## 2021-11-23 ENCOUNTER — Other Ambulatory Visit: Payer: Self-pay

## 2021-11-23 ENCOUNTER — Observation Stay (HOSPITAL_COMMUNITY)
Admission: RE | Admit: 2021-11-23 | Discharge: 2021-11-24 | Disposition: A | Discharge status: Home / Self Care | Payer: 59 | Attending: General Surgery | Admitting: General Surgery

## 2021-11-23 ENCOUNTER — Ambulatory Visit (HOSPITAL_BASED_OUTPATIENT_CLINIC_OR_DEPARTMENT_OTHER): Payer: 59 | Admitting: Certified Registered Nurse Anesthetist

## 2021-11-23 DIAGNOSIS — K432 Incisional hernia without obstruction or gangrene: Principal | ICD-10-CM | POA: Insufficient documentation

## 2021-11-23 DIAGNOSIS — Z7984 Long term (current) use of oral hypoglycemic drugs: Secondary | ICD-10-CM

## 2021-11-23 DIAGNOSIS — R7309 Other abnormal glucose: Secondary | ICD-10-CM | POA: Insufficient documentation

## 2021-11-23 DIAGNOSIS — E119 Type 2 diabetes mellitus without complications: Secondary | ICD-10-CM | POA: Diagnosis not present

## 2021-11-23 DIAGNOSIS — Z794 Long term (current) use of insulin: Secondary | ICD-10-CM

## 2021-11-23 DIAGNOSIS — Z8719 Personal history of other diseases of the digestive system: Secondary | ICD-10-CM

## 2021-11-23 HISTORY — PX: INCISIONAL HERNIA REPAIR: SHX193

## 2021-11-23 LAB — POCT PREGNANCY, URINE: Preg Test, Ur: NEGATIVE

## 2021-11-23 LAB — GLUCOSE, CAPILLARY: Glucose-Capillary: 135 mg/dL — ABNORMAL HIGH (ref 70–99)

## 2021-11-23 SURGERY — REPAIR, HERNIA, INCISIONAL, LAPAROSCOPIC
Anesthesia: General

## 2021-11-23 MED ORDER — SUGAMMADEX SODIUM 500 MG/5ML IV SOLN
INTRAVENOUS | Status: AC
  Filled 2021-11-23: qty 5

## 2021-11-23 MED ORDER — LIDOCAINE HCL (PF) 2 % IJ SOLN
INTRAMUSCULAR | Status: AC
  Filled 2021-11-23: qty 5

## 2021-11-23 MED ORDER — ONDANSETRON HCL 4 MG/2ML IJ SOLN
INTRAMUSCULAR | Status: DC | PRN
  Administered 2021-11-23: 4 mg via INTRAVENOUS

## 2021-11-23 MED ORDER — EPHEDRINE 5 MG/ML INJ
INTRAVENOUS | Status: AC
  Filled 2021-11-23: qty 5

## 2021-11-23 MED ORDER — PROPOFOL 10 MG/ML IV BOLUS
INTRAVENOUS | Status: DC | PRN
  Administered 2021-11-23: 200 mg via INTRAVENOUS
  Administered 2021-11-23: 100 mg via INTRAVENOUS

## 2021-11-23 MED ORDER — ORAL CARE MOUTH RINSE: 15.0000 mL | Freq: Once | OROMUCOSAL | Status: AC

## 2021-11-23 MED ORDER — SIMETHICONE 80 MG PO CHEW: 40.0000 mg | CHEWABLE_TABLET | Freq: Four times a day (QID) | ORAL | Status: DC | PRN

## 2021-11-23 MED ORDER — SCOPOLAMINE 1 MG/3DAYS TD PT72
1.0000 | MEDICATED_PATCH | TRANSDERMAL | Status: DC
  Administered 2021-11-23: 1.5 mg via TRANSDERMAL

## 2021-11-23 MED ORDER — FLUOXETINE HCL 20 MG PO CAPS
20.0000 mg | ORAL_CAPSULE | Freq: Every day | ORAL | Status: DC
  Administered 2021-11-24: 20 mg via ORAL
  Filled 2021-11-23: qty 1

## 2021-11-23 MED ORDER — ROCURONIUM BROMIDE 10 MG/ML (PF) SYRINGE
PREFILLED_SYRINGE | INTRAVENOUS | Status: AC
  Filled 2021-11-23: qty 10

## 2021-11-23 MED ORDER — BUPIVACAINE-EPINEPHRINE (PF) 0.25% -1:200000 IJ SOLN
INTRAMUSCULAR | Status: AC
  Filled 2021-11-23: qty 60

## 2021-11-23 MED ORDER — DEXAMETHASONE SODIUM PHOSPHATE 10 MG/ML IJ SOLN
INTRAMUSCULAR | Status: AC
  Filled 2021-11-23: qty 1

## 2021-11-23 MED ORDER — LACTATED RINGERS IV SOLN: INTRAVENOUS | Status: DC

## 2021-11-23 MED ORDER — PROPOFOL 500 MG/50ML IV EMUL
INTRAVENOUS | Status: AC
  Filled 2021-11-23: qty 50

## 2021-11-23 MED ORDER — CHLORHEXIDINE GLUCONATE CLOTH 2 % EX PADS: 6.0000 | MEDICATED_PAD | Freq: Once | CUTANEOUS | Status: DC

## 2021-11-23 MED ORDER — METHOCARBAMOL 500 MG PO TABS: 500.0000 mg | ORAL_TABLET | Freq: Four times a day (QID) | ORAL | Status: DC | PRN

## 2021-11-23 MED ORDER — ACETAMINOPHEN 500 MG PO TABS
1000.0000 mg | ORAL_TABLET | ORAL | Status: AC
  Administered 2021-11-23: 1000 mg via ORAL
  Filled 2021-11-23: qty 2

## 2021-11-23 MED ORDER — FENTANYL CITRATE (PF) 250 MCG/5ML IJ SOLN
INTRAMUSCULAR | Status: AC
  Filled 2021-11-23: qty 5

## 2021-11-23 MED ORDER — SCOPOLAMINE 1 MG/3DAYS TD PT72
MEDICATED_PATCH | TRANSDERMAL | Status: AC
  Filled 2021-11-23: qty 1

## 2021-11-23 MED ORDER — DOCUSATE SODIUM 100 MG PO CAPS
100.0000 mg | ORAL_CAPSULE | Freq: Two times a day (BID) | ORAL | Status: DC
  Administered 2021-11-23 – 2021-11-24 (×3): 100 mg via ORAL
  Filled 2021-11-23 (×3): qty 1

## 2021-11-23 MED ORDER — LIDOCAINE 2% (20 MG/ML) 5 ML SYRINGE
INTRAMUSCULAR | Status: DC | PRN
  Administered 2021-11-23: 60 mg via INTRAVENOUS

## 2021-11-23 MED ORDER — LACTATED RINGERS IR SOLN
Status: DC | PRN
  Administered 2021-11-23: 1000 mL

## 2021-11-23 MED ORDER — FENTANYL CITRATE (PF) 100 MCG/2ML IJ SOLN
INTRAMUSCULAR | Status: AC
  Filled 2021-11-23: qty 2

## 2021-11-23 MED ORDER — DEXAMETHASONE SODIUM PHOSPHATE 10 MG/ML IJ SOLN
INTRAMUSCULAR | Status: DC | PRN
  Administered 2021-11-23: 4 mg via INTRAVENOUS

## 2021-11-23 MED ORDER — ROCURONIUM BROMIDE 10 MG/ML (PF) SYRINGE
PREFILLED_SYRINGE | INTRAVENOUS | Status: DC | PRN
  Administered 2021-11-23: 60 mg via INTRAVENOUS
  Administered 2021-11-23 (×2): 20 mg via INTRAVENOUS

## 2021-11-23 MED ORDER — FENTANYL CITRATE PF 50 MCG/ML IJ SOSY
PREFILLED_SYRINGE | INTRAMUSCULAR | Status: AC
  Administered 2021-11-23: 50 ug via INTRAVENOUS
  Filled 2021-11-23: qty 3

## 2021-11-23 MED ORDER — DIPHENHYDRAMINE HCL 25 MG PO CAPS: 25.0000 mg | ORAL_CAPSULE | Freq: Four times a day (QID) | ORAL | Status: DC | PRN

## 2021-11-23 MED ORDER — VANCOMYCIN HCL 1500 MG/300ML IV SOLN
1500.0000 mg | INTRAVENOUS | Status: AC
  Administered 2021-11-23 (×2): 1500 mg via INTRAVENOUS
  Filled 2021-11-23: qty 300

## 2021-11-23 MED ORDER — KETAMINE HCL-SODIUM CHLORIDE 100-0.9 MG/10ML-% IV SOSY
PREFILLED_SYRINGE | INTRAVENOUS | Status: DC | PRN
  Administered 2021-11-23: 30 mg via INTRAVENOUS

## 2021-11-23 MED ORDER — AMISULPRIDE (ANTIEMETIC) 5 MG/2ML IV SOLN: 10.0000 mg | Freq: Once | INTRAVENOUS | Status: DC | PRN

## 2021-11-23 MED ORDER — ONDANSETRON 4 MG PO TBDP: 4.0000 mg | ORAL_TABLET | Freq: Four times a day (QID) | ORAL | Status: DC | PRN

## 2021-11-23 MED ORDER — KCL IN DEXTROSE-NACL 20-5-0.45 MEQ/L-%-% IV SOLN
INTRAVENOUS | Status: DC
  Filled 2021-11-23 (×2): qty 1000

## 2021-11-23 MED ORDER — BUPIVACAINE LIPOSOME 1.3 % IJ SUSP: 20.0000 mL | Freq: Once | INTRAMUSCULAR | Status: DC

## 2021-11-23 MED ORDER — DIPHENHYDRAMINE HCL 50 MG/ML IJ SOLN
INTRAMUSCULAR | Status: DC | PRN
  Administered 2021-11-23: 25 mg via INTRAVENOUS

## 2021-11-23 MED ORDER — PANTOPRAZOLE SODIUM 40 MG IV SOLR
40.0000 mg | Freq: Every day | INTRAVENOUS | Status: DC
  Administered 2021-11-23: 40 mg via INTRAVENOUS
  Filled 2021-11-23: qty 10

## 2021-11-23 MED ORDER — BUPIVACAINE LIPOSOME 1.3 % IJ SUSP
INTRAMUSCULAR | Status: AC
  Filled 2021-11-23: qty 20

## 2021-11-23 MED ORDER — EPHEDRINE SULFATE-NACL 50-0.9 MG/10ML-% IV SOSY
PREFILLED_SYRINGE | INTRAVENOUS | Status: DC | PRN
  Administered 2021-11-23: 10 mg via INTRAVENOUS

## 2021-11-23 MED ORDER — DIPHENHYDRAMINE HCL 50 MG/ML IJ SOLN: 25.0000 mg | Freq: Four times a day (QID) | INTRAMUSCULAR | Status: DC | PRN

## 2021-11-23 MED ORDER — MELATONIN 3 MG PO TABS: 3.0000 mg | ORAL_TABLET | Freq: Every evening | ORAL | Status: DC | PRN

## 2021-11-23 MED ORDER — 0.9 % SODIUM CHLORIDE (POUR BTL) OPTIME
TOPICAL | Status: DC | PRN
  Administered 2021-11-23: 1000 mL

## 2021-11-23 MED ORDER — FENTANYL CITRATE PF 50 MCG/ML IJ SOSY
25.0000 ug | PREFILLED_SYRINGE | INTRAMUSCULAR | Status: DC | PRN
  Administered 2021-11-23: 50 ug via INTRAVENOUS

## 2021-11-23 MED ORDER — KETOROLAC TROMETHAMINE 30 MG/ML IJ SOLN
30.0000 mg | Freq: Three times a day (TID) | INTRAMUSCULAR | Status: DC
  Administered 2021-11-23 – 2021-11-24 (×3): 30 mg via INTRAVENOUS
  Filled 2021-11-23 (×3): qty 1

## 2021-11-23 MED ORDER — ONDANSETRON HCL 4 MG/2ML IJ SOLN: 4.0000 mg | Freq: Four times a day (QID) | INTRAMUSCULAR | Status: DC | PRN

## 2021-11-23 MED ORDER — DIPHENHYDRAMINE HCL 50 MG/ML IJ SOLN
INTRAMUSCULAR | Status: AC
  Filled 2021-11-23: qty 1

## 2021-11-23 MED ORDER — MORPHINE SULFATE (PF) 2 MG/ML IV SOLN: 1.0000 mg | INTRAVENOUS | Status: DC | PRN

## 2021-11-23 MED ORDER — LABETALOL HCL 5 MG/ML IV SOLN
10.0000 mg | INTRAVENOUS | Status: AC | PRN
  Administered 2021-11-23: 10 mg via INTRAVENOUS

## 2021-11-23 MED ORDER — BUPIVACAINE LIPOSOME 1.3 % IJ SUSP
INTRAMUSCULAR | Status: DC | PRN
  Administered 2021-11-23: 20 mL

## 2021-11-23 MED ORDER — SUGAMMADEX SODIUM 200 MG/2ML IV SOLN
INTRAVENOUS | Status: DC | PRN
  Administered 2021-11-23: 500 mg via INTRAVENOUS

## 2021-11-23 MED ORDER — OXYCODONE HCL 5 MG PO TABS: 5.0000 mg | ORAL_TABLET | ORAL | Status: DC | PRN

## 2021-11-23 MED ORDER — MIDAZOLAM HCL 2 MG/2ML IJ SOLN
INTRAMUSCULAR | Status: AC
  Filled 2021-11-23: qty 2

## 2021-11-23 MED ORDER — CHLORHEXIDINE GLUCONATE 0.12 % MT SOLN
15.0000 mL | Freq: Once | OROMUCOSAL | Status: AC
  Administered 2021-11-23: 15 mL via OROMUCOSAL

## 2021-11-23 MED ORDER — ACETAMINOPHEN 500 MG PO TABS
1000.0000 mg | ORAL_TABLET | Freq: Four times a day (QID) | ORAL | Status: DC
  Administered 2021-11-23 – 2021-11-24 (×4): 1000 mg via ORAL
  Filled 2021-11-23 (×4): qty 2

## 2021-11-23 MED ORDER — ENOXAPARIN SODIUM 40 MG/0.4ML IJ SOSY
40.0000 mg | PREFILLED_SYRINGE | INTRAMUSCULAR | Status: DC
  Administered 2021-11-24: 40 mg via SUBCUTANEOUS
  Filled 2021-11-23: qty 0.4

## 2021-11-23 MED ORDER — LABETALOL HCL 5 MG/ML IV SOLN
INTRAVENOUS | Status: AC
  Administered 2021-11-23: 10 mg via INTRAVENOUS
  Filled 2021-11-23: qty 4

## 2021-11-23 MED ORDER — MIDAZOLAM HCL 2 MG/2ML IJ SOLN
INTRAMUSCULAR | Status: DC | PRN
  Administered 2021-11-23: 2 mg via INTRAVENOUS

## 2021-11-23 MED ORDER — KETAMINE HCL 50 MG/5ML IJ SOSY
PREFILLED_SYRINGE | INTRAMUSCULAR | Status: AC
  Filled 2021-11-23: qty 5

## 2021-11-23 MED ORDER — ONDANSETRON HCL 4 MG/2ML IJ SOLN
INTRAMUSCULAR | Status: AC
  Filled 2021-11-23: qty 2

## 2021-11-23 MED ORDER — BUPIVACAINE-EPINEPHRINE 0.25% -1:200000 IJ SOLN
INTRAMUSCULAR | Status: DC | PRN
  Administered 2021-11-23: 30 mL

## 2021-11-23 MED ORDER — FENTANYL CITRATE (PF) 250 MCG/5ML IJ SOLN
INTRAMUSCULAR | Status: DC | PRN
  Administered 2021-11-23 (×4): 50 ug via INTRAVENOUS
  Administered 2021-11-23: 100 ug via INTRAVENOUS
  Administered 2021-11-23: 50 ug via INTRAVENOUS

## 2021-11-23 SURGICAL SUPPLY — 49 items
BAG COUNTER SPONGE SURGICOUNT (BAG) IMPLANT
BENZOIN TINCTURE PRP APPL 2/3 (GAUZE/BANDAGES/DRESSINGS) IMPLANT
BINDER ABDOMINAL 12 ML 46-62 (SOFTGOODS) IMPLANT
CABLE HIGH FREQUENCY MONO STRZ (ELECTRODE) ×1 IMPLANT
CHLORAPREP W/TINT 26 (MISCELLANEOUS) ×1 IMPLANT
COVER SURGICAL LIGHT HANDLE (MISCELLANEOUS) ×1 IMPLANT
DERMABOND ADVANCED .7 DNX12 (GAUZE/BANDAGES/DRESSINGS) IMPLANT
DEVICE SECURE STRAP 25 ABSORB (INSTRUMENTS) IMPLANT
DEVICE TROCAR PUNCTURE CLOSURE (ENDOMECHANICALS) ×1 IMPLANT
DRAIN CHANNEL 19F RND (DRAIN) IMPLANT
DRAPE INCISE IOBAN 66X45 STRL (DRAPES) IMPLANT
DRSG TEGADERM 2-3/8X2-3/4 SM (GAUZE/BANDAGES/DRESSINGS) IMPLANT
DRSG TEGADERM 4X4.75 (GAUZE/BANDAGES/DRESSINGS) IMPLANT
ELECT PENCIL ROCKER SW 15FT (MISCELLANEOUS) IMPLANT
EVACUATOR SILICONE 100CC (DRAIN) IMPLANT
GAUZE SPONGE 2X2 8PLY STRL LF (GAUZE/BANDAGES/DRESSINGS) IMPLANT
GLOVE BIO SURGEON STRL SZ7.5 (GLOVE) ×1 IMPLANT
GLOVE INDICATOR 8.0 STRL GRN (GLOVE) ×1 IMPLANT
GOWN STRL REUS W/ TWL XL LVL3 (GOWN DISPOSABLE) ×3 IMPLANT
GOWN STRL REUS W/TWL XL LVL3 (GOWN DISPOSABLE) ×3
IRRIG SUCT STRYKERFLOW 2 WTIP (MISCELLANEOUS)
IRRIGATION SUCT STRKRFLW 2 WTP (MISCELLANEOUS) IMPLANT
KIT BASIN OR (CUSTOM PROCEDURE TRAY) ×1 IMPLANT
L-HOOK LAP DISP 36CM (ELECTROSURGICAL) ×1
LHOOK LAP DISP 36CM (ELECTROSURGICAL) IMPLANT
MARKER SKIN DUAL TIP RULER LAB (MISCELLANEOUS) ×1 IMPLANT
MESH VENTRALIGHT ST 6X8 (Mesh Specialty) ×1 IMPLANT
MESH VENTRLGHT ELLIPSE 8X6XMFL (Mesh Specialty) IMPLANT
NDL SPNL 22GX3.5 QUINCKE BK (NEEDLE) ×1 IMPLANT
NEEDLE SPNL 22GX3.5 QUINCKE BK (NEEDLE) ×1 IMPLANT
PAD POSITIONING PINK XL (MISCELLANEOUS) IMPLANT
SCISSORS LAP 5X35 DISP (ENDOMECHANICALS) IMPLANT
SET TUBE SMOKE EVAC HIGH FLOW (TUBING) ×1 IMPLANT
SHEARS HARMONIC ACE PLUS 36CM (ENDOMECHANICALS) IMPLANT
SLEEVE Z-THREAD 5X100MM (TROCAR) ×1 IMPLANT
SPIKE FLUID TRANSFER (MISCELLANEOUS) ×1 IMPLANT
STRIP CLOSURE SKIN 1/2X4 (GAUZE/BANDAGES/DRESSINGS) IMPLANT
SUT ETHILON 2 0 PS N (SUTURE) IMPLANT
SUT MNCRL AB 4-0 PS2 18 (SUTURE) ×1 IMPLANT
SUT NOVA 1 T20/GS 25DT (SUTURE) IMPLANT
SUT NOVA NAB DX-16 0-1 5-0 T12 (SUTURE) IMPLANT
SUT VIC AB 3-0 SH 18 (SUTURE) IMPLANT
TOWEL OR 17X26 10 PK STRL BLUE (TOWEL DISPOSABLE) ×1 IMPLANT
TRAY FOLEY MTR SLVR 14FR STAT (SET/KITS/TRAYS/PACK) IMPLANT
TRAY FOLEY MTR SLVR 16FR STAT (SET/KITS/TRAYS/PACK) IMPLANT
TRAY LAPAROSCOPIC (CUSTOM PROCEDURE TRAY) ×1 IMPLANT
TROCAR 11X100 Z THREAD (TROCAR) IMPLANT
TROCAR ADV FIXATION 5X100MM (TROCAR) IMPLANT
TROCAR Z-THREAD OPTICAL 5X100M (TROCAR) ×1 IMPLANT

## 2021-11-23 NOTE — Anesthesia Procedure Notes (Signed)
Procedure Name: Intubation Date/Time: 11/23/2021 8:32 AM  Performed by: Genelle Bal, CRNAPre-anesthesia Checklist: Patient identified, Emergency Drugs available, Suction available and Patient being monitored Patient Re-evaluated:Patient Re-evaluated prior to induction Oxygen Delivery Method: Circle system utilized Preoxygenation: Pre-oxygenation with 100% oxygen Induction Type: IV induction Ventilation: Mask ventilation without difficulty and Oral airway inserted - appropriate to patient size Laryngoscope Size: Sabra Heck and 2 Grade View: Grade I Tube type: Oral Tube size: 7.0 mm Number of attempts: 1 Airway Equipment and Method: Stylet and Oral airway Placement Confirmation: ETT inserted through vocal cords under direct vision, positive ETCO2 and breath sounds checked- equal and bilateral Secured at: 22 cm Tube secured with: Tape Dental Injury: Teeth and Oropharynx as per pre-operative assessment

## 2021-11-23 NOTE — Transfer of Care (Signed)
Immediate Anesthesia Transfer of Care Note  Patient: Carrie Buck  Procedure(s) Performed: LAPAROSCOPIC-ASSISTED INCISIONAL HERNIA REPAIR WITH MESH  Patient Location: PACU  Anesthesia Type:General  Level of Consciousness: awake, alert  and oriented  Airway & Oxygen Therapy: Patient Spontanous Breathing and Patient connected to face mask oxygen  Post-op Assessment: Report given to RN and Post -op Vital signs reviewed and stable  Post vital signs: Reviewed and stable  Last Vitals:  Vitals Value Taken Time  BP 165/108 11/23/21 1032  Temp    Pulse 88 11/23/21 1034  Resp 22 11/23/21 1034  SpO2 94 % 11/23/21 1034  Vitals shown include unvalidated device data.  Last Pain:  Vitals:   11/23/21 0707  TempSrc: Oral  PainSc:       Patients Stated Pain Goal: 3 (97/84/78 4128)  Complications: No notable events documented.

## 2021-11-23 NOTE — Op Note (Signed)
Carrie Buck [id number] 1980-09-07 11/23/2021   Laparoscopic Assisted Repair of Incisional (recurrent) ventral Hernia with Mesh Procedure Note; laparoscopic bilateral tap block  Indications: Symptomatic incisional (recurrent) ventral hernia.  Patient had had prior laparoscopic surgeries and developed a incisional hernia at the supraumbilical incision.  On imaging it contained omentum and a portion of transverse colon.  Patient was symptomatic affecting daily activities and she requested repair.  Please see outside note regarding discussion about risk and benefits  Pre-operative Diagnosis: Incisional (recurrent) ventral hernia (8cm x7cm)  Post-operative Diagnosis: same  Surgeon: Greer Pickerel MD FACS  Assistants: none  Anesthesia: General endotracheal anesthesia   Procedure Details  The patient was seen in the Holding Room. The risks, benefits, complications, treatment options, and expected outcomes were discussed with the patient. The possibilities of reaction to medication, pulmonary aspiration, perforation of viscus, bleeding, recurrent infection, the need for additional procedures, failure to diagnose a condition, and creating a complication requiring transfusion or operation were discussed with the patient. The patient concurred with the proposed plan, giving informed consent.  The site of surgery properly noted/marked. The patient was taken to the operating room, identified as Carrie Buck  and the procedure verified as laparoscopic assisted repair of incisional hernia with mesh. A Time Out was held and the above information confirmed.    The patient was placed supine.  After establishing general anesthesia, a Foley catheter was placed.  Left arm tucked with the appropriate padding.  The abdomen was prepped with Chloraprep and draped in standard fashion.  A 5 mm Optiview was used the cannulate the peritoneal cavity in the lateral left upper quadrant below the costal margin.   Pneumoperitoneum was obtained by insufflating CO2, maintaining a maximum pressure of 15 mmHg.  The 5 mm 30-degree laparoscopic was inserted.  There were no significant omental adhesions to the anterior abdominal wall in and around the hernia defect.   Another 5-mm port was placed in the left lateral abdominal wall.   I then took down the falciform ligament with hook electrocautery.   We visualized 1 fascial defect.  The fascial defect measured 8 cm wide by 7 cm vertical using spinal needle to mark edges of fascial defect and then measure.  The defect was at the supraumbilical old incision. With adding at least 5 cm overlap circumferentially this gave [name] a mesh requirement of a oval 15 x 18 cm.  Therefore I selected an oval piece of Bard ventral light ST mesh measuring 15 x 20 cm.  I marked the skin a marking pen on the abdominal wall where the transfascial sutures should lay  I then proceeded with the open portion of the procedure.  A vertical supraumbilical incision was created through her old incision and extended slightly.. Dissection was carried down to the hernia sac located above the fascia and was mobilized from surrounding structures.   Intact fascia was identified circumferentially around the defect.  The hernia sac was excised and discarded.  Skin and soft tissue was mobilized from the surface of the fascia in a circumferential manner.   We placed 4 stay sutures of 1-0 Novofil around the edges of the mesh.  The mesh was then rolled up and inserted through the fascial defect. The fascia was then closed transversely with primarily over the mesh with 12 interrupted 1-0-novafil sutures.   I then returned laparoscopically.  I then infiltrated Exparel in a regional fashion in the preperitoneal space around the internal fascial closure and around the expected  locations of where the transfascial sutures would be placed.  Also then performed a laparoscopic bilateral tap block for postoperative pain relief. The  mesh was then unrolled.  The stay sutures were then pulled up through small stab incisions using the Endo-close device.  This deployed the mesh widely over the fascial defect.  The stay sutures were then tied down.  The Secure Strap device was then used to tack down the edges of the mesh at 1 cm intervals circumferentially. We placed a few tacks inside the outer ring of tacks.  We inspected for hemostasis.  Had been a little bit of bleeding in the abdominal on the right lower quadrant and in the left mid abdominal wall.  Pneumoperitoneum was then released.   The supraumbilical incision was then closed in 2 layers.  Subcutaneous tissue was reapproximated with inverted interrupted 3-0 Vicryl sutures.  The skin was closed with a running 4 Monocryl in a subcuticular fashion.  I then reestablished pneumoperitoneum to reinspect the abdominal wall in those 2 areas where it had do some.  There was no evidence of bleeding in the abdominal cavity at this time.  Pneumoperitoneum was released and trocars were removed.  The port sites were closed with 4-0 Monocryl.  All of the incisions and stay suture sites were then covered steri-strips and bandages.  An abdominal binder was placed around the patient's abdomen.  Foley kept in overnight.  The patient was extubated and brought to the recovery room in stable condition.  All sponge, instrument, and needle counts were correct prior to closure and at the conclusion of the case.   Findings: Type of repair - primary suture with mesh underlay Name of mesh - Bard VentralightST  Size of mesh - oval 15 x 20 cm  Mesh overlap - >5 cm  Placement of mesh - beneath fascia and into peritoneal cavity,  Estimated Blood Loss:  Minimal         Complications:  None; patient tolerated the procedure well.         Disposition: PACU - hemodynamically stable.         Condition: stable  Mary Sella. Andrey Campanile, MD, FACS General, Bariatric, & Minimally Invasive Surgery [facility]  Surgery,  [facility]

## 2021-11-23 NOTE — H&P (Signed)
CC: here for surgery  Requesting provider: n/a  HPI: Carrie Buck is an 41 y.o. female who is here for laparoscopic repair of incisional hernia with mesh.  She denies any medical changes since I saw her in the clinic.  She states that while vacationing in [place] she fell and landed on her back and had some scratches on her back and some bruising on her upper abdomen.  A little bit of nausea this morning due to nervousness.  Otherwise just has some general questions about postoperative recovery  Last seen in clinic at the end of August Old hpi: Carrie Buck is a 41 y.o. female who is seen today for for follow-up after undergoing CT scan for bulge in her upper abdomen. She underwent Laparoscopic Cholecystectomy with ICG dye immunofluorescence and laparoscopic removal of adjustable gastric band procedure on 05/10/21. I last saw her in the clinic in early August when she complained of a bulge above her umbilicus. It was causing nausea and intermittent discomfort. She ended up going to the emergency room a few days later and having a CT scan which showed a loop of nonobstructed transverse colon and a fascial defect measuring 4.3 cm wide by 6.5 cm vertical in the supraumbilical position. She states she has been wearing an abdominal binder which is helped. She states that she was started on Wegovy about 2 weeks ago and is on 0.25 mg. She has not really noticed any change in her appetite or weight just yet. She states that she is having discomfort pretty much most of the time in the supraumbilical position. It is affecting her quality of life and her ability to be physically active to help with her weight loss. She states that she normally was walking long distances to help with her weight loss but after walking short period time she will have discomfort in the upper bellybutton area. No vomiting. Still having daily bowel movements. No vomiting.  Past Medical History:  Diagnosis Date   Anxiety    Back  pain    Complication of anesthesia    Constipation    Constipation    Generalized headaches    Heart murmur    as child   History of kidney stones    Joint pain    Knee pain    LAP-BAND surgery status    Leg edema    Lower back pain    Obesity    Ovarian cyst    PONV (postoperative nausea and vomiting)    Pre-diabetes    Prediabetes    Stress fracture of foot    Left Foot with Tendonitis   Vitamin D deficiency     Past Surgical History:  Procedure Laterality Date   CESAREAN SECTION     x2   CHOLECYSTECTOMY N/A 05/10/2021   Procedure: LAPAROSCOPIC CHOLECYSTECTOMY;  Surgeon: Gaynelle Adu, MD;  Location: [facility];  Service: General;  Laterality: N/A;   LAPAROSCOPIC APPENDECTOMY N/A 03/25/2017   Procedure: APPENDECTOMY LAPAROSCOPIC;  Surgeon: Ovidio Kin, MD;  Location: [facility];  Service: General;  Laterality: N/A;   LAPAROSCOPIC GASTRIC BAND REMOVAL WITH LAPAROSCOPIC GASTRIC SLEEVE RESECTION  05/10/2021   LAPAROSCOPIC GASTRIC BANDING     NASAL SINUS SURGERY     TUBAL LIGATION     WISDOM TOOTH EXTRACTION      Family History  Problem Relation Age of Onset   Hypertension Mother    Diabetes Mother    Thyroid disease Mother    Obesity Mother    Hyperlipidemia  Mother    Hypertension Father    Cancer Father    Hyperlipidemia Father    Breast cancer Neg Hx     Social:  reports that she has never smoked. She has never used smokeless tobacco. She reports current alcohol use of about 3.0 standard drinks of alcohol per week. She reports that she does not use drugs.  Allergies:  Allergies  Allergen Reactions   Penicillins Rash    Has patient had a PCN reaction causing immediate rash, facial/tongue/throat swelling, SOB or lightheadedness with hypotension: Yes Has patient had a PCN reaction causing severe rash involving mucus membranes or skin necrosis: No Has patient had a PCN reaction that required hospitalization: No Has patient had a PCN reaction occurring within the last  10 years: No If all of the above answers are "NO", then may proceed with Cephalosporin use.     Medications: I have reviewed the patient's current medications.   ROS - all of the below systems have been reviewed with the patient and positives are indicated with bold text General: chills, fever or night sweats Eyes: blurry vision or double vision ENT: epistaxis or sore throat Allergy/Immunology: itchy/watery eyes or nasal congestion Hematologic/Lymphatic: bleeding problems, blood clots or swollen lymph nodes Endocrine: temperature intolerance or unexpected weight changes Breast: new or changing breast lumps or nipple discharge Resp: cough, shortness of breath, or wheezing CV: chest pain or dyspnea on exertion GI: as per HPI GU: dysuria, trouble voiding, or hematuria MSK: joint pain or joint stiffness Neuro: TIA or stroke symptoms Derm: pruritus and skin lesion changes Psych: anxiety and depression  PE Last menstrual period 11/08/2021. Constitutional: NAD; conversant; no deformities Eyes: Moist conjunctiva; no lid lag; anicteric; PERRL Neck: Trachea midline; no thyromegaly Lungs: Normal respiratory effort; no tactile fremitus CV: RRR; no palpable thrills; no pitting edema GI: Abd soft, nt, nd, bulge around umbilicus; no palpable hepatosplenomegaly; old trocar scars, very mild subtle bruising across the left upper abdomen MSK: Normal gait; no clubbing/cyanosis Psychiatric: Appropriate affect; alert and oriented x3 Lymphatic: No palpable cervical or axillary lymphadenopathy Skin:no rash/lesions/jaundice  No results found for this or any previous visit (from the past 44 hour(s)).  No results found.  Imaging: reviewed  A/P: Carrie Buck is an 40 y.o. female with  Incisional hernia, without obstruction or gangrene  S/P laparoscopic cholecystectomy  H/O laparoscopic adjustable gastric banding - removal 05/10/21  Severe obesity (CMS-HCC)  To operating room this  morning for laparoscopic assisted repair of incisional hernia with mesh IV antibiotic Enhanced recovery protocol Discussed the possibility that we may not be able to bring the fascial defect back primarily Rediscussed potential pain issues and nerve issues after surgery Scopolamine patch per patient request Discussed postoperative recovery again with patient  Leighton Ruff. Redmond Pulling, MD, FACS General, Bariatric, & Minimally Invasive Surgery [place]

## 2021-11-23 NOTE — Anesthesia Postprocedure Evaluation (Signed)
Anesthesia Post Note  Patient: Carrie Buck  Procedure(s) Performed: LAPAROSCOPIC-ASSISTED [place] WITH MESH     Patient location during evaluation: PACU Anesthesia Type: General Level of consciousness: awake and alert Pain management: pain level controlled Vital Signs Assessment: post-procedure vital signs reviewed and stable Respiratory status: spontaneous breathing, nonlabored ventilation, respiratory function stable and patient connected to nasal cannula oxygen Cardiovascular status: blood pressure returned to baseline and stable Postop Assessment: no apparent nausea or vomiting Anesthetic complications: no   No notable events documented.  Last Vitals:  Vitals:   11/23/21 1100 11/23/21 1115  BP: (!) 147/89 (!) 169/106  Pulse: 88 83  Resp: 13 16  Temp: (!) 36.4 C   SpO2: 92% 94%    Last Pain:  Vitals:   11/23/21 1115  TempSrc:   PainSc: 3                  Tiajuana Amass

## 2021-11-23 NOTE — Anesthesia Preprocedure Evaluation (Signed)
Anesthesia Evaluation  Patient identified by MRN, date of birth, ID band Patient awake    Reviewed: Allergy & Precautions, NPO status , Patient's Chart, lab work & pertinent test results, reviewed documented beta blocker date and time   History of Anesthesia Complications (+) PONV and history of anesthetic complications  Airway Mallampati: II  TM Distance: >3 FB Neck ROM: Full    Dental no notable dental hx. (+) Teeth Intact, Dental Advisory Given   Pulmonary neg pulmonary ROS,    Pulmonary exam normal breath sounds clear to auscultation       Cardiovascular negative cardio ROS Normal cardiovascular exam+ Valvular Problems/Murmurs  Rhythm:Regular Rate:Normal     Neuro/Psych  Headaches, Anxiety    GI/Hepatic Neg liver ROS, Biliary dyskinesia Undesired lap gastric band   Endo/Other  diabetes, Well Controlled, Type 2, Insulin Dependent, Oral Hypoglycemic AgentsMorbid obesity  Renal/GU negative Renal ROS  negative genitourinary   Musculoskeletal negative musculoskeletal ROS (+)   Abdominal (+) + obese,   Peds  Hematology negative hematology ROS (+)   Anesthesia Other Findings   Reproductive/Obstetrics                             Lab Results  Component Value Date   WBC 8.2 11/11/2021   HGB 13.3 11/11/2021   HCT 41.8 11/11/2021   MCV 91.3 11/11/2021   PLT 291 11/11/2021   Lab Results  Component Value Date   CREATININE 0.85 11/11/2021   BUN 14 11/11/2021   NA 137 11/11/2021   K 3.8 11/11/2021   CL 103 11/11/2021   CO2 23 11/11/2021    Anesthesia Physical  Anesthesia Plan  ASA: 3  Anesthesia Plan: General   Post-op Pain Management: Tylenol PO (pre-op)*, Ketamine IV* and Lidocaine infusion*   Induction: Intravenous, Rapid sequence and Cricoid pressure planned  PONV Risk Score and Plan: 4 or greater and Scopolamine patch - Pre-op, Midazolam, Ondansetron, Dexamethasone and  Treatment may vary due to age or medical condition  Airway Management Planned: Oral ETT  Additional Equipment: None  Intra-op Plan:   Post-operative Plan: Extubation in OR  Informed Consent: I have reviewed the patients History and Physical, chart, labs and discussed the procedure including the risks, benefits and alternatives for the proposed anesthesia with the patient or authorized representative who has indicated his/her understanding and acceptance.     Dental advisory given  Plan Discussed with: CRNA and Anesthesiologist  Anesthesia Plan Comments:         Anesthesia Quick Evaluation

## 2021-11-24 ENCOUNTER — Other Ambulatory Visit (HOSPITAL_COMMUNITY): Payer: Self-pay

## 2021-11-24 ENCOUNTER — Encounter (HOSPITAL_COMMUNITY): Payer: Self-pay | Admitting: General Surgery

## 2021-11-24 DIAGNOSIS — K432 Incisional hernia without obstruction or gangrene: Secondary | ICD-10-CM | POA: Diagnosis not present

## 2021-11-24 LAB — BASIC METABOLIC PANEL
Anion gap: 9 (ref 5–15)
BUN: 10 mg/dL (ref 6–20)
CO2: 24 mmol/L (ref 22–32)
Calcium: 8.8 mg/dL — ABNORMAL LOW (ref 8.9–10.3)
Chloride: 104 mmol/L (ref 98–111)
Creatinine, Ser: 0.87 mg/dL (ref 0.44–1.00)
GFR, Estimated: >60 mL/min (ref >60)
Glucose, Bld: 113 mg/dL — ABNORMAL HIGH (ref 70–99)
Potassium: 4.2 mmol/L (ref 3.5–5.1)
Sodium: 137 mmol/L (ref 135–145)

## 2021-11-24 LAB — CBC
HCT: 33.6 % — ABNORMAL LOW (ref 36.0–46.0)
Hemoglobin: 10.7 g/dL — ABNORMAL LOW (ref 12.0–15.0)
MCH: 29.5 pg (ref 26.0–34.0)
MCHC: 31.8 g/dL (ref 30.0–36.0)
MCV: 92.6 fL (ref 80.0–100.0)
Platelets: 249 10*3/uL (ref 150–400)
RBC: 3.63 MIL/uL — ABNORMAL LOW (ref 3.87–5.11)
RDW: 13.7 % (ref 11.5–15.5)
WBC: 10.2 10*3/uL (ref 4.0–10.5)
nRBC: 0 % (ref 0.0–0.2)

## 2021-11-24 LAB — MAGNESIUM: Magnesium: 2.2 mg/dL (ref 1.7–2.4)

## 2021-11-24 MED ORDER — ACETAMINOPHEN 500 MG PO TABS: 1000.0000 mg | ORAL_TABLET | Freq: Three times a day (TID) | ORAL | 0 refills | Status: AC

## 2021-11-24 MED ORDER — IBUPROFEN 600 MG PO TABS
600.0000 mg | ORAL_TABLET | Freq: Three times a day (TID) | ORAL | 0 refills | Status: AC
  Filled 2021-11-24: qty 21, 7d supply, fill #0

## 2021-11-24 MED ORDER — PANTOPRAZOLE SODIUM 40 MG PO TBEC: 40.0000 mg | DELAYED_RELEASE_TABLET | Freq: Every day | ORAL | Status: DC

## 2021-11-24 MED ORDER — METHOCARBAMOL 500 MG PO TABS
500.0000 mg | ORAL_TABLET | Freq: Four times a day (QID) | ORAL | 0 refills | Status: AC | PRN
  Filled 2021-11-24: qty 24, 6d supply, fill #0

## 2021-11-24 MED ORDER — OXYCODONE HCL 5 MG PO TABS
5.0000 mg | ORAL_TABLET | Freq: Four times a day (QID) | ORAL | 0 refills | Status: AC | PRN
  Filled 2021-11-24: qty 30, 7d supply, fill #0

## 2021-11-24 NOTE — Discharge Instructions (Signed)
[facility] LAPAROSCOPIC SURGERY: POST OP INSTRUCTIONS Always review your discharge instruction sheet given to you by the facility where your surgery was performed. IF YOU HAVE DISABILITY OR FAMILY LEAVE FORMS, YOU MUST BRING THEM TO THE OFFICE FOR PROCESSING.   DO NOT GIVE THEM TO YOUR DOCTOR.  PAIN CONTROL  First take acetaminophen (Tylenol) AND/or ibuprofen (Advil) to control your pain after surgery.  Follow directions on package.  Taking acetaminophen (Tylenol) and/or ibuprofen (Advil) regularly after surgery will help to control your pain and lower the amount of prescription pain medication you may need.  You should not take more than 3,000 mg (3 grams) of acetaminophen (Tylenol) in 24 hours.  You should not take ibuprofen (Advil), aleve, motrin, naprosyn or other NSAIDS if you have a history of stomach ulcers or chronic kidney disease.  A prescription for pain medication may be given to you upon discharge.  Take your pain medication as prescribed, if you still have uncontrolled pain after taking acetaminophen (Tylenol) or ibuprofen (Advil). Use ice packs to help control pain. If you need a refill on your pain medication, please contact your pharmacy.  They will contact our office to request authorization. Prescriptions will not be filled after 5pm or on week-ends.  HOME MEDICATIONS Take your usually prescribed medications unless otherwise directed.  DIET You should follow a light diet the first few days after arrival home.  Be sure to include lots of fluids daily. Avoid fatty, fried foods.   CONSTIPATION It is common to experience some constipation after surgery and if you are taking pain medication.  Increasing fluid intake and taking a stool softener (such as Colace) will usually help or prevent this problem from occurring.  A mild laxative (Milk of Magnesia or Miralax) should be taken according to package instructions if there are no bowel movements after 48  hours.  WOUND/INCISION CARE Most patients will experience some swelling and bruising in the area of the incisions.  Ice packs will help.  Swelling and bruising can take several days to resolve.  Unless discharge instructions indicate otherwise, follow guidelines below  STERI-STRIPS - you may remove your outer bandages 48 hours after surgery, and you may shower at that time.  You have steri-strips (small skin tapes) in place directly over the incision.  These strips should be left on the skin for 7-10 days.   DERMABOND/SKIN GLUE - you may shower in 24 hours.  The glue will flake off over the next 2-3 weeks. Any sutures or staples will be removed at the office during your follow-up visit.  ACTIVITIES You may resume regular (light) daily activities beginning the next day--such as daily self-care, walking, climbing stairs--gradually increasing activities as tolerated.  You may have sexual intercourse when it is comfortable.  Refrain from any heavy lifting or straining until approved by your doctor. You may drive when you are no longer taking prescription pain medication, you can comfortably wear a seatbelt, and you can safely maneuver your car and apply brakes.  FOLLOW-UP You should see your doctor in the office for a follow-up appointment approximately 2-3 weeks after your surgery.  You should have been given your post-op/follow-up appointment when your surgery was scheduled.  If you did not receive a post-op/follow-up appointment, make sure that you call for this appointment within a day or two after you arrive home to insure a convenient appointment time.  OTHER INSTRUCTIONS Wear abdominal binder for comfort  WHEN TO CALL YOUR DOCTOR: Fever over 101.0   Inability to urinate Continued bleeding from incision. Increased pain, redness, or drainage from the incision. Increasing abdominal pain  The clinic staff is available to answer your questions during regular business hours.  Please don't  hesitate to call and ask to speak to one of the nurses for clinical concerns.  If you have a medical emergency, go to the nearest emergency room or call 911.  A surgeon from [facility] is always on call at the hospital. [address], [address], [place], [place]  [address] ? [address] [address], [place], [place]   [address] [phone] ? [phone] ? FAX [phone] Web site: [email or web address]    Managing Your Pain After Surgery Without Opioids    Thank you for participating in our program to help patients manage their pain after surgery without opioids. This is part of our effort to provide you with the best care possible, without exposing you or your family to the risk that opioids pose.  What pain can I expect after surgery? You can expect to have some pain after surgery. This is normal. The pain is typically worse the day after surgery, and quickly begins to get better. Many studies have found that many patients are able to manage their pain after surgery with Over-the-Counter (OTC) medications such as Tylenol and Motrin. If you have a condition that does not allow you to take Tylenol or Motrin, notify your surgical team.  How will I manage my pain? The best strategy for controlling your pain after surgery is around the clock pain control with Tylenol (acetaminophen) and Motrin (ibuprofen or Advil). Alternating these medications with each other allows you to maximize your pain control. In addition to Tylenol and Motrin, you can use heating pads or ice packs on your incisions to help reduce your pain.  How will I alternate your regular strength over-the-counter pain medication? You will take a dose of pain medication every three hours. Start by taking 650 mg of Tylenol (2 pills of 325 mg) 3 hours later take 600 mg of Motrin (3 pills of 200 mg) 3 hours after taking the Motrin take 650 mg of Tylenol 3 hours after that take 600 mg of Motrin.   - 1 -  See  example - if your first dose of Tylenol is at 12:00 PM   12:00 PM Tylenol 650 mg (2 pills of 325 mg)  3:00 PM Motrin 600 mg (3 pills of 200 mg)  6:00 PM Tylenol 650 mg (2 pills of 325 mg)  9:00 PM Motrin 600 mg (3 pills of 200 mg)  Continue alternating every 3 hours   We recommend that you follow this schedule around-the-clock for at least 3 days after surgery, or until you feel that it is no longer needed. Use the table on the last page of this handout to keep track of the medications you are taking. Important: Do not take more than 3000mg of Tylenol or 1600mg of Motrin in a 24-hour period. Do not take ibuprofen/Motrin if you have a history of bleeding stomach ulcers, severe kidney disease, &/or actively taking a blood thinner  What if I still have pain? If you have pain that is not controlled with the over-the-counter pain medications (Tylenol and Motrin or Advil) you might have what we call "breakthrough" pain. You will receive a prescription for a small amount of an opioid pain medication such as Oxycodone, Tramadol, or Tylenol with Codeine. Use these opioid pills in the first 24 hours after surgery if   you have breakthrough pain. Do not take more than 1 pill every 4-6 hours.  If you still have uncontrolled pain after using all opioid pills, don't hesitate to call our staff using the number provided. We will help make sure you are managing your pain in the best way possible, and if necessary, we can provide a prescription for additional pain medication.   Day 1    Time  Name of Medication Number of pills taken  Amount of Acetaminophen  Pain Level   Comments  AM PM       AM PM       AM PM       AM PM       AM PM       AM PM       AM PM       AM PM       Total Daily amount of Acetaminophen Do not take more than  3,000 mg per day      Day 2    Time  Name of Medication Number of pills taken  Amount of Acetaminophen  Pain Level   Comments  AM PM       AM PM       AM  PM       AM PM       AM PM       AM PM       AM PM       AM PM       Total Daily amount of Acetaminophen Do not take more than  3,000 mg per day      Day 3    Time  Name of Medication Number of pills taken  Amount of Acetaminophen  Pain Level   Comments  AM PM       AM PM       AM PM       AM PM          AM PM       AM PM       AM PM       AM PM       Total Daily amount of Acetaminophen Do not take more than  3,000 mg per day      Day 4    Time  Name of Medication Number of pills taken  Amount of Acetaminophen  Pain Level   Comments  AM PM       AM PM       AM PM       AM PM       AM PM       AM PM       AM PM       AM PM       Total Daily amount of Acetaminophen Do not take more than  3,000 mg per day      Day 5    Time  Name of Medication Number of pills taken  Amount of Acetaminophen  Pain Level   Comments  AM PM       AM PM       AM PM       AM PM       AM PM       AM PM       AM PM       AM PM       Total Daily amount of Acetaminophen Do   not take more than  3,000 mg per day       Day 6    Time  Name of Medication Number of pills taken  Amount of Acetaminophen  Pain Level  Comments  AM PM       AM PM       AM PM       AM PM       AM PM       AM PM       AM PM       AM PM       Total Daily amount of Acetaminophen Do not take more than  3,000 mg per day      Day 7    Time  Name of Medication Number of pills taken  Amount of Acetaminophen  Pain Level   Comments  AM PM       AM PM       AM PM       AM PM       AM PM       AM PM       AM PM       AM PM       Total Daily amount of Acetaminophen Do not take more than  3,000 mg per day        For additional information about how and where to safely dispose of unused opioid medications - https://www.morepowerfulnc.org  Disclaimer: This document contains information and/or instructional materials adapted from [facility] for the typical patient  with your condition. It does not replace medical advice from your health care provider because your experience may differ from that of the typical patient. Talk to your health care provider if you have any questions about this document, your condition or your treatment plan. Adapted from [facility]  

## 2021-11-24 NOTE — Discharge Summary (Signed)
Physician Discharge Summary  Carrie Buck EPP:[id number] DOB: 1980/11/04 DOA: 11/23/2021  PCP: Alyson Ingles, PA-C  Admit date: 11/23/2021 Discharge date: 11/24/2021  Recommendations for Outpatient Follow-up:     Follow-up Information     Gaynelle Adu, MD Follow up in 3 week(s).   Specialty: General Surgery Why: [facility] with Dr Andrey Campanile, For wound re-check Contact information: [address] [place] [address] [phone]                Discharge Diagnoses:  Incisional recurrent ventral hernia Severe obesity  Surgical Procedure: Laparoscopic Assisted Repair of Incisional (recurrent) ventral Hernia with Mesh Procedure Note; laparoscopic bilateral tap block  Discharge Condition: good Disposition: home  Diet recommendation: regular  Filed Weights   11/23/21 0706  Weight: (!) 140.6 kg    History of present illness:  She comes in for a planned repair of a symptomatic incisional hernia.   Hospital Course:  She underwent the above-mentioned procedure.  See operative note for additional details.  She was kept overnight for observation given the complexity of the procedure as well as to monitor for signs of an ileus as well as pain control.  On postop day 1 she was doing well.  She was tolerating a diet without nausea or vomiting.  She was ambulating.  Her pain was controlled. We discussed pain control.  We discussed discussed dc instructions.   BP 120/72 (BP Location: Right Arm)   Pulse 71   Temp 98.1 F (36.7 C) (Oral)   Resp 18   Ht 5' 5.5" (1.664 m)   Wt (!) 140.6 kg   LMP 11/08/2021   SpO2 98%   BMI 50.80 kg/m   Gen: alert, NAD, non-toxic appearing Pupils: equal, no scleral icterus Pulm: Lungs clear to auscultation, symmetric chest rise CV: regular rate and rhythm Abd: soft, mild expected tender, nondistended. No cellulitis. No incisional hernia Ext: no edema, no calf tenderness Skin: no rash, no  jaundice    Discharge Instructions  Discharge Instructions     Call MD for:   Complete by: As directed    Temperature >101   Call MD for:  hives   Complete by: As directed    Call MD for:  persistant dizziness or light-headedness   Complete by: As directed    Call MD for:  persistant nausea and vomiting   Complete by: As directed    Call MD for:  redness, tenderness, or signs of infection (pain, swelling, redness, odor or green/yellow discharge around incision site)   Complete by: As directed    Call MD for:  severe uncontrolled pain   Complete by: As directed    Diet general   Complete by: As directed    Discharge instructions   Complete by: As directed    See [facility] discharge instructions   Increase activity slowly   Complete by: As directed       Allergies as of 11/24/2021       Reactions   Penicillins Rash   Has patient had a PCN reaction causing immediate rash, facial/tongue/throat swelling, SOB or lightheadedness with hypotension: Yes Has patient had a PCN reaction causing severe rash involving mucus membranes or skin necrosis: No Has patient had a PCN reaction that required hospitalization: No Has patient had a PCN reaction occurring within the last 10 years: No If all of the above answers are "NO", then may proceed with Cephalosporin use.        Medication  List     TAKE these medications    acetaminophen 500 MG tablet Commonly known as: TYLENOL Take 2 tablets (1,000 mg total) by mouth every 8 (eight) hours for 5 days.   B-12 PO Take 1 tablet by mouth daily.   CO Q-10 PO Take 1 capsule by mouth daily.   diphenhydrAMINE 25 MG tablet Commonly known as: BENADRYL Take 25 mg by mouth as needed for allergies.   FLUoxetine 20 MG capsule Commonly known as: PROZAC Take 20 mg by mouth daily.   ibuprofen 600 MG tablet Commonly known as: ADVIL Take 1 tablet (600 mg total) by mouth 3 (three) times daily for 7 days.   Junel FE 1.5/30 1.5-30 MG-MCG  tablet Generic drug: norethindrone-ethinyl estradiol-iron Take 1 tablet by mouth daily.   methocarbamol 500 MG tablet Commonly known as: ROBAXIN Take 1 tablet (500 mg total) by mouth every 6 (six) hours as needed for up to 7 days for muscle spasms.   multivitamin with minerals Tabs tablet Take 1 tablet by mouth daily.   omeprazole 20 MG capsule Commonly known as: PRILOSEC Take 20 mg by mouth daily as needed (acid reflux).   oxyCODONE 5 MG immediate release tablet Commonly known as: Oxy IR/ROXICODONE Take 1 tablet (5 mg total) by mouth every 6 (six) hours as needed for severe pain.   Vitamin C 500 MG Caps Take 500 mg by mouth 3 (three) times a week.   VITAMIN D BOOSTER PO Take 50,000 Units by mouth every 30 (thirty) days. REPLASTA   Wegovy 0.25 MG/0.5ML Soaj Generic drug: Semaglutide-Weight Management Inject 0.25 mg into the skin every Thursday.   zinc gluconate 50 MG tablet Take 50 mg by mouth daily.        Follow-up Information     Greer Pickerel, MD Follow up in 3 week(s).   Specialty: General Surgery Why: [facility] [facility] with Dr Redmond Pulling, For wound re-check Contact information: [place] [address] [phone]                  The results of significant diagnostics from this hospitalization (including imaging, microbiology, ancillary and laboratory) are listed below for reference.    Significant Diagnostic Studies: MM DIAG BREAST TOMO UNI RIGHT  Result Date: 11/18/2021 CLINICAL DATA:  41 year old female presenting as a recall from screening for possible right breast distortion. EXAM: DIGITAL DIAGNOSTIC UNILATERAL RIGHT MAMMOGRAM WITH TOMOSYNTHESIS TECHNIQUE: Right digital diagnostic mammography and breast tomosynthesis was performed. COMPARISON:  Previous exam(s). ACR Breast Density Category a: The breast tissue is almost entirely fatty. FINDINGS: Spot compression tomosynthesis CC as well as full field CC,  exaggerated CC and true lateral tomosynthesis views of the right breast were performed for questioned area of distortion in the outer right breast. On the additional imaging the tissue in this area disperses without persistent asymmetry, mass, or true distortion. The finding on screening mammogram most likely represented normal overlapping fibroglandular tissue. There are no new suspicious findings in the right breast on today's imaging. IMPRESSION: No mammographic evidence of malignancy in the right breast. RECOMMENDATION: Screening mammogram in one year.(Code:[id number]-B-01Y) I have discussed the findings and recommendations with the patient. If applicable, a reminder letter will be sent to the patient regarding the next appointment. BI-RADS CATEGORY  1: Negative. Electronically Signed   By: Audie Pinto M.D.   On: 11/18/2021 11:59   MM 3D SCREEN BREAST BILATERAL  Result Date: 11/08/2021 CLINICAL DATA:  Screening. EXAM: DIGITAL SCREENING BILATERAL MAMMOGRAM  WITH TOMOSYNTHESIS AND CAD TECHNIQUE: Bilateral screening digital craniocaudal and mediolateral oblique mammograms were obtained. Bilateral screening digital breast tomosynthesis was performed. The images were evaluated with computer-aided detection. COMPARISON:  Previous exam(s). ACR Breast Density Category a: The breast tissue is almost entirely fatty. FINDINGS: In the right breast, possible distortion warrants further evaluation. In the left breast, no findings suspicious for malignancy. IMPRESSION: Further evaluation is suggested for possible distortion in the right breast. RECOMMENDATION: Diagnostic mammogram and possibly ultrasound of the right breast. (Code:[id number]-74M) The patient will be contacted regarding the findings, and additional imaging will be scheduled. BI-RADS CATEGORY  0: Incomplete. Need additional imaging evaluation and/or prior mammograms for comparison. Electronically Signed   By: Frederico Hamman M.D.   On: 11/08/2021 08:08     Microbiology: No results found for this or any previous visit (from the past 240 hour(s)).   Labs: Basic Metabolic Panel: Recent Labs  Lab 11/24/21 0616  NA 137  K 4.2  CL 104  CO2 24  GLUCOSE 113*  BUN 10  CREATININE 0.87  CALCIUM 8.8*  MG 2.2   Liver Function Tests: No results for input(s): "AST", "ALT", "ALKPHOS", "BILITOT", "PROT", "ALBUMIN" in the last 168 hours. No results for input(s): "LIPASE", "AMYLASE" in the last 168 hours. No results for input(s): "AMMONIA" in the last 168 hours. CBC: Recent Labs  Lab 11/24/21 0436  WBC 10.2  HGB 10.7*  HCT 33.6*  MCV 92.6  PLT 249   Cardiac Enzymes: No results for input(s): "CKTOTAL", "CKMB", "CKMBINDEX", "TROPONINI" in the last 168 hours. BNP: BNP (last 3 results) No results for input(s): "BNP" in the last 8760 hours.  ProBNP (last 3 results) No results for input(s): "PROBNP" in the last 8760 hours.  CBG: Recent Labs  Lab 11/23/21 1034  GLUCAP 135*    Principal Problem:   S/P repair of ventral hernia   Time coordinating discharge: 20 min  Signed:  Atilano Ina, MD [facility], [place] [phone] 11/24/2021, 8:38 AM

## 2021-11-24 NOTE — Progress Notes (Signed)
Nurse reviewed discharge instructions with pt. Pt verbalized understanding of discharge instructions, follow up appointment and new medications.  No concerns at time of discharge. Pt husband to pick her up.

## 2021-11-24 NOTE — TOC Progression Note (Signed)
Transition of Care [facility]) - Progression Note    Patient Details  Name: IDALIE CANTO MRN: [id number] Date of Birth: 09-30-1980  Transition of Care [facility]) CM/SW Contact  Henrietta Dine, RN Phone Number: 11/24/2021, 9:41 AM  Clinical Narrative:     Transition of Care [facility]) Screening Note   Patient Details  Name: MAEVA DANT Date of Birth: 1980-07-14   Transition of Care [facility]) CM/SW Contact:    Henrietta Dine, RN Phone Number: 11/24/2021, 9:42 AM    Transition of Care Department [facility]) has reviewed patient and no TOC needs have been identified at this time. We will continue to monitor patient advancement through interdisciplinary progression rounds. If new patient transition needs arise, please place a TOC consult.          Expected Discharge Plan and Services           Expected Discharge Date: 11/24/21                                     Social Determinants of Health (SDOH) Interventions Food Insecurity Interventions: Patient Refused Housing Interventions: Patient Refused Utilities Interventions: Patient Refused  Readmission Risk Interventions     No data to display

## 2022-09-18 ENCOUNTER — Other Ambulatory Visit (HOSPITAL_COMMUNITY): Payer: Self-pay | Admitting: Internal Medicine

## 2022-09-18 DIAGNOSIS — R519 Headache, unspecified: Secondary | ICD-10-CM

## 2022-09-18 DIAGNOSIS — R6889 Other general symptoms and signs: Secondary | ICD-10-CM

## 2022-09-22 ENCOUNTER — Ambulatory Visit (HOSPITAL_COMMUNITY)
Admission: RE | Admit: 2022-09-22 | Discharge: 2022-09-22 | Disposition: A | Discharge status: Home / Self Care | Payer: 59 | Source: Ambulatory Visit | Attending: Internal Medicine | Admitting: Internal Medicine

## 2022-09-22 DIAGNOSIS — R519 Headache, unspecified: Secondary | ICD-10-CM | POA: Diagnosis not present

## 2022-10-03 ENCOUNTER — Ambulatory Visit (HOSPITAL_COMMUNITY)
Admission: RE | Admit: 2022-10-03 | Discharge: 2022-10-03 | Disposition: A | Discharge status: Home / Self Care | Payer: 59 | Source: Ambulatory Visit | Attending: Internal Medicine | Admitting: Internal Medicine

## 2022-10-03 DIAGNOSIS — R519 Headache, unspecified: Secondary | ICD-10-CM | POA: Diagnosis present

## 2022-10-03 DIAGNOSIS — R6889 Other general symptoms and signs: Secondary | ICD-10-CM | POA: Insufficient documentation

## 2022-10-03 MED ORDER — LIDOCAINE HCL (PF) 1 % IJ SOLN
5.0000 mL | Freq: Once | INTRAMUSCULAR | Status: AC
  Administered 2022-10-03: 5 mL

## 2022-10-03 MED ORDER — ACETAMINOPHEN 500 MG PO TABS: 1000.0000 mg | ORAL_TABLET | Freq: Four times a day (QID) | ORAL | Status: DC | PRN

## 2022-10-03 MED ORDER — LIDOCAINE HCL (PF) 1 % IJ SOLN
5.0000 mL | Freq: Once | INTRAMUSCULAR | Status: AC
  Administered 2022-10-03: 1 mL

## 2022-10-05 ENCOUNTER — Emergency Department (HOSPITAL_COMMUNITY)
Admission: EM | Admit: 2022-10-05 | Discharge: 2022-10-05 | Disposition: A | Discharge status: Home / Self Care | Payer: 59 | Attending: Emergency Medicine | Admitting: Emergency Medicine

## 2022-10-05 ENCOUNTER — Encounter (HOSPITAL_COMMUNITY): Payer: Self-pay

## 2022-10-05 DIAGNOSIS — G8918 Other acute postprocedural pain: Secondary | ICD-10-CM | POA: Insufficient documentation

## 2022-10-05 DIAGNOSIS — G971 Other reaction to spinal and lumbar puncture: Secondary | ICD-10-CM | POA: Diagnosis not present

## 2022-10-05 DIAGNOSIS — R519 Headache, unspecified: Secondary | ICD-10-CM | POA: Diagnosis present

## 2022-10-05 MED ORDER — CAFFEINE 200 MG PO TABS: 200.0000 mg | ORAL_TABLET | Freq: Once | ORAL | Status: DC

## 2022-10-05 MED ORDER — ACETAMINOPHEN 325 MG PO TABS
650.0000 mg | ORAL_TABLET | Freq: Once | ORAL | Status: AC
  Administered 2022-10-05: 650 mg via ORAL
  Filled 2022-10-05: qty 2

## 2022-10-05 MED ORDER — SODIUM CHLORIDE 0.9 % IV BOLUS
1000.0000 mL | Freq: Once | INTRAVENOUS | Status: AC
  Administered 2022-10-05: 1000 mL via INTRAVENOUS

## 2022-10-05 NOTE — ED Provider Notes (Signed)
[place] EMERGENCY DEPARTMENT AT [facility] Provider Note   CSN: [id number] Arrival date & time: 10/05/22  0827     History  Chief Complaint  Patient presents with   Post-op Problem    Carrie Buck is a 42 y.o. female.  Patient is a 42 yo female presenting from home for headache. Patient admits to severe, sharp, headache described as "an icepick to the brain" and associated dizziness that is triggered by standing and completely resolves with rest. Patient had a lumbar puncture performed 2 days ago for evaluation for pseudotumor cerebri. No improvement after motrin or tylenol.   The history is provided by the patient. No language interpreter was used.       Home Medications Prior to Admission medications   Medication Sig Start Date End Date Taking? Authorizing Provider  Ascorbic Acid (VITAMIN C) 500 MG CAPS Take 500 mg by mouth 3 (three) times a week.    [provider]  Coenzyme Q10 (CO Q-10 PO) Take 1 capsule by mouth daily.    [provider]  Cyanocobalamin (B-12 PO) Take 1 tablet by mouth daily.    [provider]  diphenhydrAMINE (BENADRYL) 25 MG tablet Take 25 mg by mouth as needed for allergies.    [provider]  FLUoxetine (PROZAC) 20 MG capsule Take 20 mg by mouth daily.    [provider]  JUNEL FE 1.5/30 1.5-30 MG-MCG tablet Take 1 tablet by mouth daily. 12/29/20   [provider]  Multiple Vitamin (MULTIVITAMIN WITH MINERALS) TABS tablet Take 1 tablet by mouth daily.    [provider]  Nutritional Supplements (VITAMIN D BOOSTER PO) Take 50,000 Units by mouth every 30 (thirty) days. REPLASTA    [provider]  omeprazole (PRILOSEC) 20 MG capsule Take 20 mg by mouth daily as needed (acid reflux). 03/10/21   [provider]  oxyCODONE (OXY IR/ROXICODONE) 5 MG immediate release tablet Take 1 tablet (5 mg total) by mouth every 6 (six) hours as needed for severe pain.  11/24/21   Gaynelle Adu, MD  Semaglutide-Weight Management [facility]) 0.25 MG/0.5ML SOAJ Inject 0.25 mg into the skin every Thursday.    [provider]  zinc gluconate 50 MG tablet Take 50 mg by mouth daily.    [provider]      Allergies    Penicillins    Review of Systems   Review of Systems  Constitutional:  Negative for chills and fever.  HENT:  Negative for ear pain and sore throat.   Eyes:  Negative for pain and visual disturbance.  Respiratory:  Negative for cough and shortness of breath.   Cardiovascular:  Negative for chest pain and palpitations.  Gastrointestinal:  Negative for abdominal pain and vomiting.  Genitourinary:  Negative for dysuria and hematuria.  Musculoskeletal:  Negative for arthralgias and back pain.  Skin:  Negative for color change and rash.  Neurological:  Negative for seizures and syncope.  All other systems reviewed and are negative.   Physical Exam Updated Vital Signs BP (!) 156/85 Comment: nurse notified  Pulse 66   Temp 98.5 F (36.9 C) (Oral)   Resp 20   LMP 10/02/2022 (Exact Date)   SpO2 99%  Physical Exam Vitals and nursing note reviewed.  Constitutional:      General: She is not in acute distress.    Appearance: She is well-developed.  HENT:     Head: Normocephalic and atraumatic.  Eyes:     Conjunctiva/sclera:  Conjunctivae normal.  Cardiovascular:     Rate and Rhythm: Normal rate and regular rhythm.     Heart sounds: No murmur heard. Pulmonary:     Effort: Pulmonary effort is normal. No respiratory distress.     Breath sounds: Normal breath sounds.  Abdominal:     Palpations: Abdomen is soft.     Tenderness: There is no abdominal tenderness.  Musculoskeletal:        General: No swelling.     Cervical back: Neck supple.  Skin:    General: Skin is warm and dry.     Capillary Refill: Capillary refill takes less than 2 seconds.  Neurological:     Mental Status: She is alert and oriented to person, place,  and time.     GCS: GCS eye subscore is 4. GCS verbal subscore is 5. GCS motor subscore is 6.     Cranial Nerves: Cranial nerves 2-12 are intact.     Sensory: Sensation is intact.     Motor: Motor function is intact.     Coordination: Coordination is intact.  Psychiatric:        Mood and Affect: Mood normal.     ED Results / Procedures / Treatments   Labs (all labs ordered are listed, but only abnormal results are displayed) Labs Reviewed - No data to display  EKG None  Radiology No results found.  Procedures Procedures    Medications Ordered in ED Medications  sodium chloride 0.9 % bolus 1,000 mL (1,000 mLs Intravenous New Bag/Given 10/05/22 1108)  sodium chloride 0.9 % bolus 1,000 mL (1,000 mLs Intravenous New Bag/Given 10/05/22 1012)  acetaminophen (TYLENOL) tablet 650 mg (650 mg Oral Given 10/05/22 1006)    ED Course/ Medical Decision Making/ A&P                                 Medical Decision Making Risk OTC drugs.   42 year old female postop day 2 from lumbar puncture presenting for complaints of headache and dizziness that are worse upon standing.  Exam patient is alert oriented x 3, no acute distress, afebrile, stable vital signs.  Physical exam demonstrates no neurovascular dysfunction.  Symptoms suggestive of cerebral fluid hypotension.  IV fluids x 2 L, Tylenol, caffeine given.  On reevaluation patient has significant improvement of symptoms.  She states she still has minor headache when standing but like to go home and continue to aggressively hydrate and rest.  Highly recommend returning to emergency department in the next 24 to 48 hours if symptoms improved for a blood patch by anesthesia.  Patient agreeable to plan.  Discharge instructions on epidural blood patch for spinal headache provided at discharge.  Patient in no distress and overall condition improved here in the ED. Detailed discussions were had with the patient regarding current findings, and need  for close f/u with PCP or on call doctor. The patient has been instructed to return immediately if the symptoms worsen in any way for re-evaluation. Patient verbalized understanding and is in agreement with current care plan. All questions answered prior to discharge.        Final Clinical Impression(s) / ED Diagnoses Final diagnoses:  Post-op pain-likely CSF hypotension  Lumbar puncture headache    Rx / DC Orders ED Discharge Orders     None         Franne Forts, DO 10/05/22 1207

## 2022-10-05 NOTE — ED Triage Notes (Signed)
Pt presents with c/o headache and some dizziness post lumbar puncture 2 days ago. Pt reports she was told to seek help if she began to have a headache, especially sitting or standing, which she has had for approx one day.

## 2022-10-06 ENCOUNTER — Encounter (HOSPITAL_COMMUNITY): Payer: Self-pay

## 2022-10-06 ENCOUNTER — Other Ambulatory Visit: Payer: Self-pay

## 2022-10-06 ENCOUNTER — Emergency Department (HOSPITAL_COMMUNITY): Payer: 59 | Admitting: Anesthesiology

## 2022-10-06 ENCOUNTER — Emergency Department (HOSPITAL_COMMUNITY)
Admission: EM | Admit: 2022-10-06 | Discharge: 2022-10-06 | Disposition: A | Discharge status: Home / Self Care | Payer: 59 | Attending: Emergency Medicine | Admitting: Emergency Medicine

## 2022-10-06 DIAGNOSIS — Z794 Long term (current) use of insulin: Secondary | ICD-10-CM | POA: Insufficient documentation

## 2022-10-06 DIAGNOSIS — R519 Headache, unspecified: Secondary | ICD-10-CM | POA: Insufficient documentation

## 2022-10-06 MED ORDER — FENTANYL CITRATE PF 50 MCG/ML IJ SOSY
PREFILLED_SYRINGE | INTRAMUSCULAR | Status: AC
  Administered 2022-10-06: 50 ug via INTRAVENOUS
  Filled 2022-10-06: qty 2

## 2022-10-06 MED ORDER — FENTANYL CITRATE PF 50 MCG/ML IJ SOSY: 100.0000 ug | PREFILLED_SYRINGE | Freq: Once | INTRAMUSCULAR | Status: AC

## 2022-10-06 MED ORDER — ONDANSETRON HCL 4 MG/2ML IJ SOLN
4.0000 mg | Freq: Once | INTRAMUSCULAR | Status: AC
  Administered 2022-10-06: 4 mg via INTRAVENOUS
  Filled 2022-10-06: qty 2

## 2022-10-06 MED ORDER — KETOROLAC TROMETHAMINE 30 MG/ML IJ SOLN
30.0000 mg | Freq: Once | INTRAMUSCULAR | Status: AC
  Administered 2022-10-06: 30 mg via INTRAVENOUS
  Filled 2022-10-06: qty 1

## 2022-10-06 MED ORDER — SODIUM CHLORIDE 0.9 % IV BOLUS
1000.0000 mL | Freq: Once | INTRAVENOUS | Status: AC
  Administered 2022-10-06: 1000 mL via INTRAVENOUS

## 2022-10-06 NOTE — ED Triage Notes (Signed)
Pt was seen yesterday for complications related to Lumbar puncture. Pt was offered a blood patch but declined, was told to return if s/s do not improve for the blood patch. Pt returning today requesting the blood patch.

## 2022-10-06 NOTE — Anesthesia Procedure Notes (Addendum)
Epidural Patient location during procedure: pre-op Start time: 10/06/2022 12:21 PM End time: 10/06/2022 12:36 PM  Staffing Anesthesiologist: Trevor Iha, MD Performed: anesthesiologist   Preanesthetic Checklist Completed: patient identified, IV checked, site marked, risks and benefits discussed, surgical consent, monitors and equipment checked, pre-op evaluation and timeout performed  Epidural Patient position: sitting Prep: Betadine Patient monitoring: heart rate, continuous pulse ox and blood pressure Approach: midline Injection technique: LOR air  Needle:  Needle type: Tuohy  Needle gauge: 18 G Needle length: 9 cm and 9 Needle insertion depth: 7 cm  Additional Notes 30cc of patients blood injected in sterility through the epidural in the space easily until patient with back pressure. Needle withdrawn and patient reclined gently on stretcher. Patients HA resolved. Patient to lie flat for 45 minutes prior to DC for SS back to [facility] for block:Epidural Blood Patch

## 2022-10-06 NOTE — Discharge Instructions (Signed)
Lie flat is much as possible tonight.  Stay hydrated.  Take Tylenol or Motrin as needed and follow-up if any more problems

## 2022-10-06 NOTE — Anesthesia Preprocedure Evaluation (Addendum)
Anesthesia Evaluation  Patient identified by MRN, date of birth, ID band Patient awake    Reviewed: Allergy & Precautions, NPO status , Patient's Chart, lab work & pertinent test results  Airway Mallampati: II  TM Distance: >3 FB Neck ROM: Full    Dental no notable dental hx. (+) Teeth Intact, Dental Advisory Given   Pulmonary neg pulmonary ROS   Pulmonary exam normal breath sounds clear to auscultation       Cardiovascular negative cardio ROS Normal cardiovascular exam Rhythm:Regular Rate:Normal     Neuro/Psych    GI/Hepatic negative GI ROS, Neg liver ROS,,,  Endo/Other  negative endocrine ROS    Renal/GU negative Renal ROS     Musculoskeletal negative musculoskeletal ROS (+)    Abdominal   Peds  Hematology negative hematology ROS (+)   Anesthesia Other Findings All: PCN  Reproductive/Obstetrics                             Anesthesia Physical Anesthesia Plan  ASA: 3  Anesthesia Plan: Epidural   Post-op Pain Management:    Induction:   PONV Risk Score and Plan: Treatment may vary due to age or medical condition  Airway Management Planned:   Additional Equipment:   Intra-op Plan:   Post-operative Plan:   Informed Consent: I have reviewed the patients History and Physical, chart, labs and discussed the procedure including the risks, benefits and alternatives for the proposed anesthesia with the patient or authorized representative who has indicated his/her understanding and acceptance.       Plan Discussed with:   Anesthesia Plan Comments: (Consulted By ED Physician for Blood patch. Patient Re[facility] 3 days ago patient with PDPH. Pt afebrile and no meningealk sx wil proceed to blood patch)       Anesthesia Quick Evaluation

## 2022-10-06 NOTE — ED Provider Notes (Signed)
[place] EMERGENCY DEPARTMENT AT [facility] Provider Note   CSN: [id number] Arrival date & time: 10/06/22  0800     History {Add pertinent medical, surgical, social history, OB history to HPI:1} Chief Complaint  Patient presents with   Headache    Carrie Buck is a 42 y.o. female.  Patient with a headache.  Secondary to an LP a few days ago.  She was seen here in the emergency department yesterday and treated for a headache and was offered a blood patch yesterday but declined.  Patient would like to have a blood patch now   Headache      Home Medications Prior to Admission medications   Medication Sig Start Date End Date Taking? Authorizing Provider  Ascorbic Acid (VITAMIN C) 500 MG CAPS Take 500 mg by mouth 3 (three) times a week.    [provider]  Coenzyme Q10 (CO Q-10 PO) Take 1 capsule by mouth daily.    [provider]  Cyanocobalamin (B-12 PO) Take 1 tablet by mouth daily.    [provider]  diphenhydrAMINE (BENADRYL) 25 MG tablet Take 25 mg by mouth as needed for allergies.    [provider]  FLUoxetine (PROZAC) 20 MG capsule Take 20 mg by mouth daily.    [provider]  JUNEL FE 1.5/30 1.5-30 MG-MCG tablet Take 1 tablet by mouth daily. 12/29/20   [provider]  Multiple Vitamin (MULTIVITAMIN WITH MINERALS) TABS tablet Take 1 tablet by mouth daily.    [provider]  Nutritional Supplements (VITAMIN D BOOSTER PO) Take 50,000 Units by mouth every 30 (thirty) days. REPLASTA    [provider]  omeprazole (PRILOSEC) 20 MG capsule Take 20 mg by mouth daily as needed (acid reflux). 03/10/21   [provider]  oxyCODONE (OXY IR/ROXICODONE) 5 MG immediate release tablet Take 1 tablet (5 mg total) by mouth every 6 (six) hours as needed for severe pain. 11/24/21   Gaynelle Adu, MD  Semaglutide-Weight Management [facility]) 0.25 MG/0.5ML SOAJ Inject 0.25 mg into the skin every  Thursday.    [provider]  zinc gluconate 50 MG tablet Take 50 mg by mouth daily.    [provider]      Allergies    Penicillins    Review of Systems   Review of Systems  Neurological:  Positive for headaches.    Physical Exam Updated Vital Signs BP (!) 163/96   Pulse 65   Temp 98.3 F (36.8 C) (Oral)   Resp 17   Ht 5\' 5"  (1.651 m)   Wt (!) 142.9 kg   LMP 10/02/2022 (Exact Date)   SpO2 96%   BMI 52.42 kg/m  Physical Exam  ED Results / Procedures / Treatments   Labs (all labs ordered are listed, but only abnormal results are displayed) Labs Reviewed - No data to display  EKG None  Radiology No results found.  Procedures Procedures  {Document cardiac monitor, telemetry assessment procedure when appropriate:1}  Medications Ordered in ED Medications  sodium chloride 0.9 % bolus 1,000 mL (1,000 mLs Intravenous New Bag/Given 10/06/22 1022)  ketorolac (TORADOL) 30 MG/ML injection 30 mg (30 mg Intravenous Given 10/06/22 1022)  ondansetron (ZOFRAN) injection 4 mg (4 mg Intravenous Given 10/06/22 1022)  fentaNYL (SUBLIMAZE) injection 100 mcg (50 mcg Intravenous Given 10/06/22 1220)    ED Course/ Medical Decision Making/ A&P  Patient received a blood patch and feels much better. {   Click here for ABCD2,  HEART and other calculatorsREFRESH Note before signing :1}                              Medical Decision Making Risk Prescription drug management.   Headache from LP.  Patient improved with blood patch and will follow-up as needed  {Document critical care time when appropriate:1} {Document review of labs and clinical decision tools ie heart score, Chads2Vasc2 etc:1}  {Document your independent review of radiology images, and any outside records:1} {Document your discussion with family members, caretakers, and with consultants:1} {Document social determinants of health affecting pt's care:1} {Document your decision making why or why not  admission, treatments were needed:1} Final Clinical Impression(s) / ED Diagnoses Final diagnoses:  Bad headache    Rx / DC Orders ED Discharge Orders     None

## 2022-10-20 ENCOUNTER — Other Ambulatory Visit: Payer: Self-pay | Admitting: Physician Assistant

## 2022-10-20 DIAGNOSIS — Z0000 Encounter for general adult medical examination without abnormal findings: Secondary | ICD-10-CM

## 2022-11-09 ENCOUNTER — Ambulatory Visit
Admission: RE | Admit: 2022-11-09 | Discharge: 2022-11-09 | Disposition: A | Discharge status: Home / Self Care | Payer: 59 | Source: Ambulatory Visit | Attending: Physician Assistant | Admitting: Physician Assistant

## 2022-11-09 DIAGNOSIS — Z0000 Encounter for general adult medical examination without abnormal findings: Secondary | ICD-10-CM

## 2022-12-11 ENCOUNTER — Emergency Department (HOSPITAL_COMMUNITY)
Admission: EM | Admit: 2022-12-11 | Discharge: 2022-12-11 | Disposition: A | Discharge status: Home / Self Care | Payer: 59 | Attending: Emergency Medicine | Admitting: Emergency Medicine

## 2022-12-11 ENCOUNTER — Other Ambulatory Visit: Payer: Self-pay

## 2022-12-11 ENCOUNTER — Encounter (HOSPITAL_COMMUNITY): Payer: Self-pay | Admitting: Emergency Medicine

## 2022-12-11 DIAGNOSIS — R112 Nausea with vomiting, unspecified: Secondary | ICD-10-CM | POA: Diagnosis present

## 2022-12-11 DIAGNOSIS — R197 Diarrhea, unspecified: Secondary | ICD-10-CM | POA: Insufficient documentation

## 2022-12-11 LAB — CBC WITH DIFFERENTIAL/PLATELET
Abs Immature Granulocytes: 0.06 10*3/uL (ref 0.00–0.07)
Basophils Absolute: 0 10*3/uL (ref 0.0–0.1)
Basophils Relative: 0 %
Eosinophils Absolute: 0 10*3/uL (ref 0.0–0.5)
Eosinophils Relative: 0 %
HCT: 44.8 % (ref 36.0–46.0)
Hemoglobin: 13.7 g/dL (ref 12.0–15.0)
Immature Granulocytes: 0 %
Lymphocytes Relative: 9 %
Lymphs Abs: 1.4 10*3/uL (ref 0.7–4.0)
MCH: 27.7 pg (ref 26.0–34.0)
MCHC: 30.6 g/dL (ref 30.0–36.0)
MCV: 90.7 fL (ref 80.0–100.0)
Monocytes Absolute: 0.8 10*3/uL (ref 0.1–1.0)
Monocytes Relative: 5 %
Neutro Abs: 12.6 10*3/uL — ABNORMAL HIGH (ref 1.7–7.7)
Neutrophils Relative %: 86 %
Platelets: 362 10*3/uL (ref 150–400)
RBC: 4.94 MIL/uL (ref 3.87–5.11)
RDW: 14.2 % (ref 11.5–15.5)
WBC: 14.9 10*3/uL — ABNORMAL HIGH (ref 4.0–10.5)
nRBC: 0.1 % (ref 0.0–0.2)

## 2022-12-11 LAB — COMPREHENSIVE METABOLIC PANEL
ALT: 36 U/L (ref 0–44)
AST: 18 U/L (ref 15–41)
Albumin: 4.8 g/dL (ref 3.5–5.0)
Alkaline Phosphatase: 100 U/L (ref 38–126)
Anion gap: 12 (ref 5–15)
BUN: 18 mg/dL (ref 6–20)
CO2: 20 mmol/L — ABNORMAL LOW (ref 22–32)
Calcium: 9.7 mg/dL (ref 8.9–10.3)
Chloride: 107 mmol/L (ref 98–111)
Creatinine, Ser: 0.98 mg/dL (ref 0.44–1.00)
GFR, Estimated: >60 mL/min (ref >60)
Glucose, Bld: 177 mg/dL — ABNORMAL HIGH (ref 70–99)
Potassium: 3.5 mmol/L (ref 3.5–5.1)
Sodium: 139 mmol/L (ref 135–145)
Total Bilirubin: 0.6 mg/dL (ref <1.2)
Total Protein: 9.4 g/dL — ABNORMAL HIGH (ref 6.5–8.1)

## 2022-12-11 LAB — LIPASE, BLOOD: Lipase: 28 U/L (ref 11–51)

## 2022-12-11 LAB — HCG, SERUM, QUALITATIVE: Preg, Serum: NEGATIVE

## 2022-12-11 MED ORDER — SODIUM CHLORIDE 0.9 % IV BOLUS
1000.0000 mL | Freq: Once | INTRAVENOUS | Status: AC
  Administered 2022-12-11: 1000 mL via INTRAVENOUS

## 2022-12-11 MED ORDER — ONDANSETRON HCL 4 MG PO TABS: 4.0000 mg | ORAL_TABLET | Freq: Four times a day (QID) | ORAL | 0 refills | Status: AC

## 2022-12-11 MED ORDER — ACETAMINOPHEN 500 MG PO TABS
1000.0000 mg | ORAL_TABLET | Freq: Once | ORAL | Status: AC
  Administered 2022-12-11: 1000 mg via ORAL
  Filled 2022-12-11: qty 2

## 2022-12-11 MED ORDER — ONDANSETRON HCL 4 MG/2ML IJ SOLN
4.0000 mg | Freq: Once | INTRAMUSCULAR | Status: AC
  Administered 2022-12-11: 4 mg via INTRAVENOUS
  Filled 2022-12-11: qty 2

## 2022-12-11 MED ORDER — KETOROLAC TROMETHAMINE 15 MG/ML IJ SOLN
15.0000 mg | Freq: Once | INTRAMUSCULAR | Status: AC
  Administered 2022-12-11: 15 mg via INTRAVENOUS
  Filled 2022-12-11: qty 1

## 2022-12-11 NOTE — Discharge Instructions (Signed)
Return for any problem.  ?

## 2022-12-11 NOTE — ED Triage Notes (Signed)
Patient arrives in wheelchair by POV c/o nausea, emesis and diarrhea onset of this morning. Patient denies pain anywhere. Reports feeling very fatigued and cold.

## 2022-12-11 NOTE — ED Provider Notes (Signed)
[place] EMERGENCY DEPARTMENT AT [facility] Provider Note   CSN: [id number] Arrival date & time: 12/11/22  1036     History  Chief Complaint  Patient presents with   Emesis   Diarrhea    Carrie Buck is a 42 y.o. female.  42 year old female arrives from home with her family.  Patient reports acute onset nausea, vomiting, loose watery diarrheal stool this morning.  Patient reports that she had awakened and was getting ready for work when symptoms suddenly began.  She reports multiple episodes of nausea and associated vomiting.  She denies bloody emesis.  She reports multiple loose watery bowel movements.  She denies bloody stool.  She denies abdominal pain.  She denies fever.  In addition to her husband she is also accompanied by 2 children who live with her.  Apparently one of her children may have had some type of GI illness earlier week.  The child has made a full recovery without incident.  The history is provided by the patient and medical records.       Home Medications Prior to Admission medications   Medication Sig Start Date End Date Taking? Authorizing Provider  Ascorbic Acid (VITAMIN C) 500 MG CAPS Take 500 mg by mouth 3 (three) times a week.    [provider]  Coenzyme Q10 (CO Q-10 PO) Take 1 capsule by mouth daily.    [provider]  Cyanocobalamin (B-12 PO) Take 1 tablet by mouth daily.    [provider]  diphenhydrAMINE (BENADRYL) 25 MG tablet Take 25 mg by mouth as needed for allergies.    [provider]  FLUoxetine (PROZAC) 20 MG capsule Take 20 mg by mouth daily.    [provider]  JUNEL FE 1.5/30 1.5-30 MG-MCG tablet Take 1 tablet by mouth daily. 12/29/20   [provider]  Multiple Vitamin (MULTIVITAMIN WITH MINERALS) TABS tablet Take 1 tablet by mouth daily.    [provider]  Nutritional Supplements (VITAMIN D BOOSTER PO) Take 50,000 Units by mouth every 30 (thirty)  days. REPLASTA    [provider]  omeprazole (PRILOSEC) 20 MG capsule Take 20 mg by mouth daily as needed (acid reflux). 03/10/21   [provider]  oxyCODONE (OXY IR/ROXICODONE) 5 MG immediate release tablet Take 1 tablet (5 mg total) by mouth every 6 (six) hours as needed for severe pain. 11/24/21   Gaynelle Adu, MD  Semaglutide-Weight Management [facility]) 0.25 MG/0.5ML SOAJ Inject 0.25 mg into the skin every Thursday.    [provider]  zinc gluconate 50 MG tablet Take 50 mg by mouth daily.    [provider]      Allergies    Penicillins    Review of Systems   Review of Systems  All other systems reviewed and are negative.   Physical Exam Updated Vital Signs BP 138/80   Pulse (!) 118   Temp 97.8 F (36.6 C) (Oral)   Resp 18   Ht 5\' 5"  (1.651 m)   Wt (!) 142.9 kg   SpO2 100%   BMI 52.42 kg/m  Physical Exam Vitals and nursing note reviewed.  Constitutional:      General: She is not in acute distress.    Appearance: Normal appearance. She is well-developed.  HENT:     Head: Normocephalic and atraumatic.  Eyes:     Conjunctiva/sclera: Conjunctivae normal.     Pupils: Pupils are equal, round, and reactive to light.  Cardiovascular:  Rate and Rhythm: Normal rate and regular rhythm.     Heart sounds: Normal heart sounds.  Pulmonary:     Effort: Pulmonary effort is normal. No respiratory distress.     Breath sounds: Normal breath sounds.  Abdominal:     General: There is no distension.     Palpations: Abdomen is soft.     Tenderness: There is no abdominal tenderness.  Musculoskeletal:        General: No deformity. Normal range of motion.     Cervical back: Normal range of motion and neck supple.  Skin:    General: Skin is warm and dry.  Neurological:     General: No focal deficit present.     Mental Status: She is alert and oriented to person, place, and time.     ED Results / Procedures / Treatments   Labs (all labs  ordered are listed, but only abnormal results are displayed) Labs Reviewed  CBC WITH DIFFERENTIAL/PLATELET - Abnormal; Notable for the following components:      Result Value   WBC 14.9 (*)    Neutro Abs 12.6 (*)    All other components within normal limits  COMPREHENSIVE METABOLIC PANEL - Abnormal; Notable for the following components:   CO2 20 (*)    Glucose, Bld 177 (*)    Total Protein 9.4 (*)    All other components within normal limits  LIPASE, BLOOD  HCG, SERUM, QUALITATIVE  URINALYSIS, W/ REFLEX TO CULTURE (INFECTION SUSPECTED)    EKG None  Radiology No results found.  Procedures Procedures    Medications Ordered in ED Medications  sodium chloride 0.9 % bolus 1,000 mL (1,000 mLs Intravenous New Bag/Given 12/11/22 1101)  ondansetron (ZOFRAN) injection 4 mg (4 mg Intravenous Given 12/11/22 1102)    ED Course/ Medical Decision Making/ A&P                                 Medical Decision Making Amount and/or Complexity of Data Reviewed Labs: ordered.  Risk OTC drugs. Prescription drug management.    Medical Screen Complete  This patient presented to the ED with complaint of nausea, vomiting, diarrhea.  This complaint involves an extensive number of treatment options. The initial differential diagnosis includes, but is not limited to, likely viral gastroenteritis, metabolic abnormality, etc.  This presentation is: Acute, Self-Limited, Previously Undiagnosed, Uncertain Prognosis, Complicated, Systemic Symptoms, and Threat to Life/Bodily Function  Patient is presenting with acute onset nausea, vomiting, diarrhea.  Describes symptoms are most consistent with likely viral gastroenteritis.  With IV fluids and antiemetics patient feels much improved.  Screening labs are without significant acute abnormality.  Prior to discharge patient is feeling much improved and is taking p.o. well.  Importance of close follow-up is stressed.  Strict return precautions  given and understood.  Additional history obtained:  External records from outside sources obtained and reviewed including prior ED visits and prior Inpatient records.    Lab Tests:  I ordered and personally interpreted labs.  The pertinent results include: CBC, CMP, lipase, HCG  Cardiac Monitoring:  The patient was maintained on a cardiac monitor.  I personally viewed and interpreted the cardiac monitor which showed an underlying rhythm of: Sinus tach then NSR   Medicines ordered:  I ordered medication including IV fluids, antiemetics for nausea, vomiting Reevaluation of the patient after these medicines showed that the patient: improved   Problem List / ED Course:  Nausea, vomiting, diarrhea   Reevaluation:  After the interventions noted above, I reevaluated the patient and found that they have: improved  Disposition:  After consideration of the diagnostic results and the patients response to treatment, I feel that the patent would benefit from close outpatient follow-up.          Final Clinical Impression(s) / ED Diagnoses Final diagnoses:  Nausea vomiting and diarrhea    Rx / DC Orders ED Discharge Orders     None         Wynetta Fines, MD 12/11/22 1515

## 2023-01-10 ENCOUNTER — Encounter: Payer: Self-pay | Admitting: Diagnostic Neuroimaging

## 2023-01-10 ENCOUNTER — Ambulatory Visit (INDEPENDENT_AMBULATORY_CARE_PROVIDER_SITE_OTHER): Payer: 59 | Admitting: Diagnostic Neuroimaging

## 2023-01-10 VITALS — BP 129/85 | HR 73 | Ht 65.0 in | Wt 325.0 lb

## 2023-01-10 DIAGNOSIS — G932 Benign intracranial hypertension: Secondary | ICD-10-CM

## 2023-01-10 NOTE — Patient Instructions (Signed)
  possible idiopathic intracranial hypertension (pseudotumor cerebri)  - elevated opening pressure 25cm H2O on LP; symptoms are mild and improving - follow up eye exam (if papilledema, then consider increased topiramate or starting acetazolamide)

## 2023-01-10 NOTE — Progress Notes (Signed)
[facility]  PATIENT: Carrie Buck DOB: 16-Aug-1980  REFERRING CLINICIAN: Ocie Cornfield, MD HISTORY FROM: patient  REASON FOR VISIT: new consult   HISTORICAL  CHIEF COMPLAINT:  Chief Complaint  Patient presents with   New Patient (Initial Visit)    Patient in room #6 and alone. Patient states she been having headaches, eval for IIH. Patient has recently MRI and spinal tap done back in August.    HISTORY OF PRESENT ILLNESS:   42 year old female here for evaluation of possible pseudotumor cerebri.  Over past 1 to 2 years patient has had some challenges with weight management and spite of changing exercise and nutrition.  She had some lab testing by PCP and was found to have elevated ADH level.  This prompted endocrinology evaluation.  This further was evaluated with MRI of the brain which showed no significant abnormalities of the pituitary gland.  This was followed up with lumbar puncture which showed elevated opening pressure of 25 cmH2O.  She had some problems with post lumbar puncture headaches which improved after epidural blood patching.  Patient was diagnosed with possible pseudotumor cerebri and referred here for evaluation.  Patient has had several years of ongoing mild generalized squeezing headaches, ocean sound in her ears and intermittent blurred vision.  No transient visual obscuration.   REVIEW OF SYSTEMS: Full 14 system review of systems performed and negative with exception of: as per HPI.  ALLERGIES: Allergies  Allergen Reactions   Shrimp Flavor [Flavoring Agent] Shortness Of Breath, Diarrhea, Nausea And Vomiting and Cough   Penicillins Rash    Has patient had a PCN reaction causing immediate rash, facial/tongue/throat swelling, SOB or lightheadedness with hypotension: Yes  Has patient had a PCN reaction causing severe rash involving mucus membranes or skin necrosis: No  Has patient had a PCN reaction that required hospitalization:  No  Has patient had a PCN reaction occurring within the last 10 years: No  If all of the above answers are "NO", then may proceed with Cephalosporin use.    HOME MEDICATIONS: Outpatient Medications Prior to Visit  Medication Sig Dispense Refill   Ascorbic Acid (VITAMIN C) 500 MG CAPS Take 500 mg by mouth 3 (three) times a week.     diphenhydrAMINE (BENADRYL) 25 MG tablet Take 25 mg by mouth as needed for allergies.     EPINEPHrine 0.15 MG/0.15ML IJ injection Inject 0.15 mg into the muscle as needed.     FLUoxetine (PROZAC) 20 MG capsule Take 20 mg by mouth daily.     JUNEL FE 1.5/30 1.5-30 MG-MCG tablet Take 1 tablet by mouth daily.     metFORMIN (GLUCOPHAGE) 500 MG tablet Take 500 mg by mouth daily.     Multiple Vitamin (MULTIVITAMIN WITH MINERALS) TABS tablet Take 1 tablet by mouth daily.     omeprazole (PRILOSEC) 20 MG capsule Take 20 mg by mouth daily as needed (acid reflux).     phentermine (ADIPEX-P) 37.5 MG tablet Take 18.75-37.5 mg by mouth every morning.     topiramate (TOPAMAX) 25 MG tablet Take 25 mg by mouth daily.     Coenzyme Q10 (CO Q-10 PO) Take 1 capsule by mouth daily. (Patient not taking: Reported on 01/10/2023)     Cyanocobalamin (B-12 PO) Take 1 tablet by mouth daily. (Patient not taking: Reported on 01/10/2023)     Nutritional Supplements (VITAMIN D BOOSTER PO) Take 50,000 Units by mouth every 30 (thirty) days. REPLASTA (Patient not taking: Reported on 01/10/2023)  ondansetron (ZOFRAN) 4 MG tablet Take 1 tablet (4 mg total) by mouth every 6 (six) hours. (Patient not taking: Reported on 01/10/2023) 12 tablet 0   oxyCODONE (OXY IR/ROXICODONE) 5 MG immediate release tablet Take 1 tablet (5 mg total) by mouth every 6 (six) hours as needed for severe pain. (Patient not taking: Reported on 01/10/2023) 30 tablet 0   Semaglutide-Weight Management (WEGOVY) 0.25 MG/0.5ML SOAJ Inject 0.25 mg into the skin every Thursday. (Patient not taking: Reported on 01/10/2023)     zinc  gluconate 50 MG tablet Take 50 mg by mouth daily. (Patient not taking: Reported on 01/10/2023)     No facility-administered medications prior to visit.    PAST MEDICAL HISTORY: Past Medical History:  Diagnosis Date   Anxiety    Back pain    Complication of anesthesia    Constipation    Constipation    Generalized headaches    Heart murmur    as child   History of kidney stones    Joint pain    Knee pain    LAP-BAND surgery status    Leg edema    Lower back pain    Obesity    Ovarian cyst    PONV (postoperative nausea and vomiting)    Pre-diabetes    Prediabetes    Stress fracture of foot    Left Foot with Tendonitis   Vitamin D deficiency     PAST SURGICAL HISTORY: Past Surgical History:  Procedure Laterality Date   CESAREAN SECTION     x2   CHOLECYSTECTOMY N/A 05/10/2021   Procedure: LAPAROSCOPIC CHOLECYSTECTOMY;  Surgeon: Gaynelle Adu, MD;  Location: [facility];  Service: General;  Laterality: N/A;   INCISIONAL HERNIA REPAIR N/A 11/23/2021   Procedure: LAPAROSCOPIC-ASSISTED INCISIONAL HERNIA REPAIR WITH MESH;  Surgeon: Gaynelle Adu, MD;  Location: [facility];  Service: General;  Laterality: N/A;   LAPAROSCOPIC APPENDECTOMY N/A 03/25/2017   Procedure: APPENDECTOMY LAPAROSCOPIC;  Surgeon: Ovidio Kin, MD;  Location: [facility];  Service: General;  Laterality: N/A;   LAPAROSCOPIC GASTRIC BAND REMOVAL WITH LAPAROSCOPIC GASTRIC SLEEVE RESECTION  05/10/2021   LAPAROSCOPIC GASTRIC BANDING     NASAL SINUS SURGERY     TUBAL LIGATION     WISDOM TOOTH EXTRACTION      FAMILY HISTORY: Family History  Problem Relation Age of Onset   Hypertension Mother    Diabetes Mother    Thyroid disease Mother    Obesity Mother    Hyperlipidemia Mother    Hypertension Father    Cancer Father    Hyperlipidemia Father    Breast cancer Neg Hx     SOCIAL HISTORY: Social History   Socioeconomic History   Marital status: Married    Spouse name: Aponi Groomes   Number of children: 2    Years of education: Not on file   Highest education level: Not on file  Occupational History   Occupation: Airline pilot- Property  Tobacco Use   Smoking status: Never   Smokeless tobacco: Never  Vaping Use   Vaping status: Never Used  Substance and Sexual Activity   Alcohol use: Yes    Alcohol/week: 3.0 standard drinks of alcohol    Types: 3 Glasses of wine per week    Comment: glass of wine with dinner   Drug use: No   Sexual activity: Not Currently    Comment: tubal  Other Topics Concern   Not on file  Social History Narrative   Not on file   Social Determinants  of Health   Financial Resource Strain: Not on file  Food Insecurity: Patient Declined (11/23/2021)   Hunger Vital Sign    Worried About Running Out of Food in the Last Year: Patient declined    Ran Out of Food in the Last Year: Patient declined  Transportation Needs: Patient Declined (11/23/2021)   PRAPARE - Administrator, Civil Service (Medical): Patient declined    Lack of Transportation (Non-Medical): Patient declined  Physical Activity: Not on file  Stress: Not on file  Social Connections: Not on file  Intimate Partner Violence: Patient Declined (11/23/2021)   Humiliation, Afraid, Rape, and Kick questionnaire    Fear of Current or Ex-Partner: Patient declined    Emotionally Abused: Patient declined    Physically Abused: Patient declined    Sexually Abused: Patient declined     PHYSICAL EXAM  GENERAL EXAM/CONSTITUTIONAL: Vitals:  Vitals:   01/10/23 1156  BP: 129/85  Pulse: 73  Weight: (!) 325 lb (147.4 kg)  Height: 5\' 5"  (1.651 m)   Body mass index is 54.08 kg/m. Wt Readings from Last 3 Encounters:  01/10/23 (!) 325 lb (147.4 kg)  12/11/22 (!) 315 lb (142.9 kg)  10/06/22 (!) 315 lb (142.9 kg)   Patient is in no distress; well developed, nourished and groomed; neck is supple  CARDIOVASCULAR: Examination of carotid arteries is normal; no carotid bruits Regular rate and rhythm, no  murmurs Examination of peripheral vascular system by observation and palpation is normal  EYES: Ophthalmoscopic exam of optic discs and posterior segments is normal; no papilledema or hemorrhages No results found.  MUSCULOSKELETAL: Gait, strength, tone, movements noted in Neurologic exam below  NEUROLOGIC: MENTAL STATUS:      No data to display         awake, alert, oriented to person, place and time recent and remote memory intact normal attention and concentration language fluent, comprehension intact, naming intact fund of knowledge appropriate  CRANIAL NERVE:  2nd - no papilledema on fundoscopic exam 2nd, 3rd, 4th, 6th - pupils equal and reactive to light, visual fields full to confrontation, extraocular muscles intact, no nystagmus 5th - facial sensation symmetric 7th - facial strength symmetric 8th - hearing intact 9th - palate elevates symmetrically, uvula midline 11th - shoulder shrug symmetric 12th - tongue protrusion midline  MOTOR:  normal bulk and tone, full strength in the BUE, BLE  SENSORY:  normal and symmetric to light touch, temperature, vibration  COORDINATION:  finger-nose-finger, fine finger movements normal  REFLEXES:  deep tendon reflexes present and symmetric  GAIT/STATION:  narrow based gait     DIAGNOSTIC DATA (LABS, IMAGING, TESTING) - I reviewed patient records, labs, notes, testing and imaging myself where available.  Lab Results  Component Value Date   WBC 14.9 (H) 12/11/2022   HGB 13.7 12/11/2022   HCT 44.8 12/11/2022   MCV 90.7 12/11/2022   PLT 362 12/11/2022      Component Value Date/Time   NA 139 12/11/2022 1100   NA 139 10/19/2020 0756   K 3.5 12/11/2022 1100   CL 107 12/11/2022 1100   CO2 20 (L) 12/11/2022 1100   GLUCOSE 177 (H) 12/11/2022 1100   BUN 18 12/11/2022 1100   BUN 11 10/19/2020 0756   CREATININE 0.98 12/11/2022 1100   CALCIUM 9.7 12/11/2022 1100   PROT 9.4 (H) 12/11/2022 1100   PROT 7.2  10/19/2020 0756   ALBUMIN 4.8 12/11/2022 1100   ALBUMIN 4.4 10/19/2020 0756   AST 18 12/11/2022 1100  ALT 36 12/11/2022 1100   ALKPHOS 100 12/11/2022 1100   BILITOT 0.6 12/11/2022 1100   BILITOT 0.4 10/19/2020 0756   GFRNONAA >60 12/11/2022 1100   GFRAA 112 10/23/2019 1128   Lab Results  Component Value Date   CHOL 192 10/19/2020   HDL 60 10/19/2020   LDLCALC 109 (H) 10/19/2020   TRIG 130 10/19/2020   Lab Results  Component Value Date   HGBA1C 5.4 05/03/2021   Lab Results  Component Value Date   VITAMINB12 684 10/23/2019   Lab Results  Component Value Date   TSH 2.680 02/20/2018    09/22/22 [I reviewed images myself and agree with interpretation. -VRP]  - Normal brain MRI.  No acute abnormality.   10/03/22 lumbar puncture - Clear colorless CSF spontaneously returned, with opening pressure of 25 cm water. 17 ml CSF were collected and divided among 4 sterile vials for the requested laboratory studies.   ASSESSMENT AND PLAN  42 y.o. year old female here with:   Dx:  1. IIH (idiopathic intracranial hypertension)     PLAN:  Possible idiopathic intracranial hypertension (pseudotumor cerebri)  -Headaches, tinnitus and blurred vision associated with increased BMI and elevated opening pressure do raise possibility of idiopathic intracranial hypertension; interestingly MRI of the brain shows no enlarged optic nerve sheaths or partially empty sella findings which sometimes are associated with IIH - symptoms are mild and improving; follow up eye exam (if papilledema, then consider increased topiramate or starting acetazolamide) - continue weight mgmt per PCP  Return in about 4 months (around 05/11/2023) for MyChart visit (15 min).    Suanne Marker, MD 01/10/2023, 12:35 PM Certified in Neurology, Neurophysiology and Neuroimaging  [facility] [address], [address] [place], [place] [address] [phone]

## 2023-01-26 ENCOUNTER — Encounter: Payer: Self-pay | Admitting: Internal Medicine

## 2023-01-26 ENCOUNTER — Ambulatory Visit (INDEPENDENT_AMBULATORY_CARE_PROVIDER_SITE_OTHER): Payer: 59 | Admitting: Internal Medicine

## 2023-01-26 ENCOUNTER — Other Ambulatory Visit: Payer: Self-pay

## 2023-01-26 VITALS — BP 124/82 | HR 80 | Temp 98.5°F | Resp 18 | Ht 65.5 in | Wt 320.6 lb

## 2023-01-26 DIAGNOSIS — J3089 Other allergic rhinitis: Secondary | ICD-10-CM

## 2023-01-26 DIAGNOSIS — Z880 Allergy status to penicillin: Secondary | ICD-10-CM

## 2023-01-26 DIAGNOSIS — T781XXD Other adverse food reactions, not elsewhere classified, subsequent encounter: Secondary | ICD-10-CM | POA: Diagnosis not present

## 2023-01-26 DIAGNOSIS — T781XXA Other adverse food reactions, not elsewhere classified, initial encounter: Secondary | ICD-10-CM

## 2023-01-26 NOTE — Patient Instructions (Signed)
Shellfish Allergy Recurrent episodes of gastrointestinal symptoms (nausea, vomiting, diarrhea) and respiratory symptoms (cough, shortness of breath) following consumption of shrimp. Symptoms typically begin 5-6 hours post-ingestion and last for several hours. Differential includes IgE-mediated shellfish allergy and Food Protein-Induced Enterocolitis Syndrome (FPIES). -Perform blood work and skin testing for shellfish allergy. -Avoid all shellfish and mollusks until further testing. -Provide written food allergy action plan and EpiPen training  Penicillin Allergy History of rash following penicillin administration in childhood, but tolerated penicillin at age 42. Given the long interval since the last reaction, the likelihood of current penicillin allergy is low. -Plan for penicillin challenge within the next year   Seasonal Allergies History of seasonal allergies in [place], primarily due to ragweed and cedar. Currently asymptomatic in [place]. -Will get updated environmental testing as skin testing appointment on 12/24 at 11:00  Follow up: for skin testing (1-55, seafood), avoid all antihistamines until then   Thank you so much for letting me partake in your care today.  Don't hesitate to reach out if you have any additional concerns!  Ferol Luz, MD  [facility] [place] [place] .

## 2023-01-26 NOTE — Progress Notes (Signed)
NEW PATIENT Date of Service/Encounter:  01/26/23 Referring provider: Carilyn Goodpasture, NP Primary care provider: Carilyn Goodpasture, NP  Subjective:  Carrie Buck is a 42 y.o. female  presenting today for evaluation of shrimp allergy  History obtained from: chart review and patient.   Discussed the use of AI scribe software for clinical note transcription with the patient, who gave verbal consent to proceed.  History of Present Illness   The patient, with a reported history of penicillin allergy, presents with recurrent episodes of gastrointestinal distress following consumption of shrimp. The first episode occurred in July, with subsequent episodes in September and twice in October. Each episode was characterized by an onset of symptoms approximately five to six hours post-ingestion, including nausea, vomiting, diarrhea, and notably foul-smelling burps. The symptoms typically persisted for about five hours.  The first two episodes occurred while the patient was on vacation, consuming ceviche and grilled shrimp respectively. The third episode occurred after consuming a home-cooked grilled shrimp salad. The fourth episode followed a meal of mixed fajitas with shrimp at a [employer]. During the last episode, the patient also reported a cough and a sensation of exhaustion and shortness of breath, although it is unclear if these symptoms were directly related to the food reaction.  The patient has not consumed any other shellfish, such as crab or lobster, during this period. She has, however, consumed fish (salmon and steelhead trout) without any adverse reactions. The patient also reported a history of seasonal allergies, particularly to ragweed and cedar, when residing in [place]. She has not experienced these allergies since moving to her current location.  The patient also reported a history of penicillin allergy, which manifested as a rash following administration of the antibiotic post  umbilical hernia surgery at 29 months old. However, she was able to tolerate penicillin without any adverse reactions at age 81, while also on a steroid regimen. The patient has been avoiding penicillin since then due to the previous allergic reaction.          Other allergy screening: Asthma: no Rhino conjunctivitis: yes Food allergy:  Shrimp reaction as above  Medication allergy: yes PCN  Hymenoptera allergy: no Urticaria: no Eczema:no History of recurrent infections suggestive of immunodeficency: no Vaccinations are up to date.   Past Medical History: Past Medical History:  Diagnosis Date   Anxiety    Back pain    Complication of anesthesia    Constipation    Constipation    Generalized headaches    Heart murmur    as child   History of kidney stones    Joint pain    Knee pain    LAP-BAND surgery status    Leg edema    Lower back pain    Obesity    Ovarian cyst    PONV (postoperative nausea and vomiting)    Pre-diabetes    Prediabetes    Stress fracture of foot    Left Foot with Tendonitis   Vitamin D deficiency    Medication List:  Current Outpatient Medications  Medication Sig Dispense Refill   Ascorbic Acid (VITAMIN C) 500 MG CAPS Take 500 mg by mouth 3 (three) times a week.     Cyanocobalamin (B-12 PO) Take 1 tablet by mouth daily.     diphenhydrAMINE (BENADRYL) 25 MG tablet Take 25 mg by mouth as needed for allergies.     EPINEPHrine 0.15 MG/0.15ML IJ injection Inject 0.15 mg into the muscle as needed.  FLUoxetine (PROZAC) 20 MG capsule Take 20 mg by mouth daily.     metFORMIN (GLUCOPHAGE) 500 MG tablet Take 500 mg by mouth daily.     Multiple Vitamin (MULTIVITAMIN WITH MINERALS) TABS tablet Take 1 tablet by mouth daily.     Nutritional Supplements (VITAMIN D BOOSTER PO) Take 50,000 Units by mouth every 30 (thirty) days. REPLASTA     omeprazole (PRILOSEC) 20 MG capsule Take 20 mg by mouth daily as needed (acid reflux).     phentermine (ADIPEX-P) 37.5  MG tablet Take 18.75-37.5 mg by mouth every morning.     topiramate (TOPAMAX) 25 MG tablet Take 25 mg by mouth daily.     Coenzyme Q10 (CO Q-10 PO) Take 1 capsule by mouth daily. (Patient not taking: Reported on 01/10/2023)     JUNEL FE 1.5/30 1.5-30 MG-MCG tablet Take 1 tablet by mouth daily.     ondansetron (ZOFRAN) 4 MG tablet Take 1 tablet (4 mg total) by mouth every 6 (six) hours. (Patient not taking: Reported on 01/10/2023) 12 tablet 0   oxyCODONE (OXY IR/ROXICODONE) 5 MG immediate release tablet Take 1 tablet (5 mg total) by mouth every 6 (six) hours as needed for severe pain. (Patient not taking: Reported on 01/10/2023) 30 tablet 0   Semaglutide-Weight Management (WEGOVY) 0.25 MG/0.5ML SOAJ Inject 0.25 mg into the skin every Thursday. (Patient not taking: Reported on 01/26/2023)     zinc gluconate 50 MG tablet Take 50 mg by mouth daily. (Patient not taking: Reported on 01/10/2023)     No current facility-administered medications for this visit.   Known Allergies:  Allergies  Allergen Reactions   Shrimp Flavor [Flavoring Agent] Shortness Of Breath, Diarrhea, Nausea And Vomiting and Cough   Penicillins Rash    Has patient had a PCN reaction causing immediate rash, facial/tongue/throat swelling, SOB or lightheadedness with hypotension: Yes  Has patient had a PCN reaction causing severe rash involving mucus membranes or skin necrosis: No  Has patient had a PCN reaction that required hospitalization: No  Has patient had a PCN reaction occurring within the last 10 years: No  If all of the above answers are "NO", then may proceed with Cephalosporin use.   Past Surgical History: Past Surgical History:  Procedure Laterality Date   CESAREAN SECTION     x2   CHOLECYSTECTOMY N/A 05/10/2021   Procedure: LAPAROSCOPIC CHOLECYSTECTOMY;  Surgeon: Gaynelle Adu, MD;  Location: [facility];  Service: General;  Laterality: N/A;   INCISIONAL HERNIA REPAIR N/A 11/23/2021   Procedure: LAPAROSCOPIC-ASSISTED  INCISIONAL HERNIA REPAIR WITH MESH;  Surgeon: Gaynelle Adu, MD;  Location: [facility];  Service: General;  Laterality: N/A;   LAPAROSCOPIC APPENDECTOMY N/A 03/25/2017   Procedure: APPENDECTOMY LAPAROSCOPIC;  Surgeon: Ovidio Kin, MD;  Location: [facility];  Service: General;  Laterality: N/A;   LAPAROSCOPIC GASTRIC BAND REMOVAL WITH LAPAROSCOPIC GASTRIC SLEEVE RESECTION  05/10/2021   LAPAROSCOPIC GASTRIC BANDING     NASAL SINUS SURGERY     TUBAL LIGATION     WISDOM TOOTH EXTRACTION     Family History: Family History  Problem Relation Age of Onset   Hypertension Mother    Diabetes Mother    Thyroid disease Mother    Obesity Mother    Hyperlipidemia Mother    Hypertension Father    Cancer Father    Hyperlipidemia Father    Asthma Brother    Breast cancer Neg Hx    Social History: Jessica[place]-family home that is [place] years old. .  No water  damage and house hardwood throughout.  Heat pump and central cooling.  1 dog with access to bedroom, 1 cat outside the home.  No roaches in the house and bed to be off floor.  Dust mite precautions on bed but not pillows.  No exposure cigarettes.  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  No exposed to fumes, chemicals or dust.  No HEPA filter in the home and home is not near an issue industrial area  ROS:  All other systems negative except as noted per HPI.  Objective:  Blood pressure 124/82, pulse 80, temperature 98.5 F (36.9 C), temperature source Temporal, resp. rate 18, height 5' 5.5" (1.664 m), weight (!) 320 lb 9.6 oz (145.4 kg), SpO2 98%. Body mass index is 52.54 kg/m. Physical Exam:  General Appearance:  Alert, cooperative, no distress, appears stated age  Head:  Normocephalic, without obvious abnormality, atraumatic  Eyes:  Conjunctiva clear, EOM's intact  Ears EACs normal bilaterally  Nose: Nares normal, normal mucosa, no visible anterior polyps, and septum midline  Throat: Lips, tongue normal; teeth and gums normal, normal  posterior oropharynx and mildly erythematous posterior oropharynx  Neck: Supple, symmetrical  Lungs:   clear to auscultation bilaterally, Respirations unlabored, no coughing  Heart:  regular rate and rhythm and no murmur, Appears well perfused  Extremities: No edema  Skin: Skin color, texture, turgor normal and no rashes or lesions on visualized portions of skin  Neurologic: No gross deficits   Diagnostics: NONE DONE   Labs:  Lab Orders         Allergen Profile, Shellfish         Tryptase       Assessment and Plan  Assessment and Plan    Shellfish Allergy Recurrent episodes of gastrointestinal symptoms (nausea, vomiting, diarrhea) and respiratory symptoms (cough, shortness of breath) following consumption of shrimp. Symptoms typically begin 5-6 hours post-ingestion and last for several hours. Differential includes IgE-mediated shellfish allergy and Food Protein-Induced Enterocolitis Syndrome (FPIES). -Perform blood work and skin testing for shellfish allergy. -Avoid all shellfish and mollusks until further testing. -Provide written food allergy action plan and EpiPen training  Penicillin Allergy History of rash following penicillin administration in childhood, but tolerated penicillin at age 76. Given the long interval since the last reaction, the likelihood of current penicillin allergy is low. -Plan for penicillin challenge within the next year   Seasonal Allergies History of seasonal allergies in [place], primarily due to ragweed and cedar. Currently asymptomatic in [place]. -Will get updated environmental testing as skin testing appointment on 12/24 at 11:00  Follow up: for skin testing (1-55, seafood), avoid all antihistamines until then     This note in its entirety was forwarded to the Provider who requested this consultation.  Other:  NONE   Thank you for your kind referral. I appreciate the opportunity to take part in Jamariyah's care. Please do not hesitate to  contact me with questions.  Sincerely,  Thank you so much for letting me partake in your care today.  Don't hesitate to reach out if you have any additional concerns!  Ferol Luz, MD  [facility] [place], [place]

## 2023-01-29 LAB — ALLERGEN PROFILE, SHELLFISH
Clam IgE: <0.1 kU/L
F023-IgE Crab: <0.1 kU/L
F080-IgE Lobster: <0.1 kU/L
F290-IgE Oyster: <0.1 kU/L
Scallop IgE: <0.1 kU/L
Shrimp IgE: <0.1 kU/L

## 2023-01-29 LAB — TRYPTASE: Tryptase: 5.2 ug/L (ref 2.2–13.2)

## 2023-01-30 ENCOUNTER — Ambulatory Visit: Payer: 59 | Admitting: Internal Medicine

## 2023-01-30 DIAGNOSIS — J3089 Other allergic rhinitis: Secondary | ICD-10-CM

## 2023-01-30 DIAGNOSIS — T7802XD Anaphylactic reaction due to shellfish (crustaceans), subsequent encounter: Secondary | ICD-10-CM

## 2023-01-30 MED ORDER — EPINEPHRINE 0.3 MG/0.3ML IJ SOAJ: 0.3000 mg | INTRAMUSCULAR | 1 refills | Status: AC | PRN

## 2023-01-30 NOTE — Progress Notes (Signed)
Date of Service/Encounter:  01/30/23  Allergy testing appointment   Initial visit on 01/26/23, seen for food reaction.  Please see that note for additional details.  Today reports for allergy diagnostic testing:    DIAGNOSTICS:  Skin Testing: Environmental allergy panel and select foods. Adequate positive and negative controls Results discussed with patient/family.  Airborne Adult Perc - 01/30/23 1048     Time Antigen Placed 1050    Allergen Manufacturer Waynette Buttery    Location Back    Number of Test 55    1. Control-Buffer 50% Glycerol 2+    2. Control-Histamine 4+    3. Bahia Negative    4. French Southern Territories Negative    5. Johnson Negative    6. Kentucky Blue Negative    7. Meadow Fescue Negative    8. Perennial Rye Negative    9. Timothy Negative    10. Ragweed Mix Negative    11. Cocklebur Negative    12. Plantain,  English Negative    13. Baccharis Negative    14. Dog Fennel Negative    15. Russian Thistle Negative    16. Lamb's Quarters Negative    17. Sheep Sorrell Negative    18. Rough Pigweed Negative    19. Marsh Elder, Rough Negative    20. Mugwort, Common Negative    21. Box, Elder Negative    22. Cedar, red 4+    23. Sweet Gum Negative    24. Pecan Pollen Negative    25. Pine Mix Negative    26. Walnut, Black Pollen Negative    27. Red Mulberry Negative    28. Ash Mix Negative    29. Birch Mix Negative    30. Beech American Negative    31. Cottonwood, Guinea-Bissau Negative    32. Hickory, White Negative    33. Maple Mix Negative    34. Oak, Guinea-Bissau Mix Negative    35. Sycamore Eastern Negative    36. Alternaria Alternata 4+    37. Cladosporium Herbarum Negative    38. Aspergillus Mix 3+    39. Penicillium Mix Negative    40. Bipolaris Sorokiniana (Helminthosporium) 4+    41. Drechslera Spicifera (Curvularia) 4+    42. Mucor Plumbeus Negative    43. Fusarium Moniliforme Negative    44. Aureobasidium Pullulans (pullulara) Negative    45. Rhizopus Oryzae  Negative    46. Botrytis Cinera Negative    47. Epicoccum Nigrum Negative    48. Phoma Betae Negative    49. Dust Mite Mix Negative    50. Cat Hair 10,000 BAU/ml Negative    51.  Dog Epithelia Negative    52. Mixed Feathers Negative    53. Horse Epithelia Negative    54. Cockroach, German Negative    55. Tobacco Leaf Negative             Food Adult Perc - 01/30/23 1000     Time Antigen Placed 1050    Allergen Manufacturer Waynette Buttery    Location Back    Number of allergen test 10    18. Trout Negative    19. Tuna Negative    20. Salmon Negative    21. Flounder Negative    22. Codfish Negative    23. Shrimp Negative    24. Crab Negative    25. Lobster 2+    26. Oyster Negative    27. Scallops 2+             Allergy testing results  were read and interpreted by myself, documented by clinical staff.  Patient provided with copy of allergy testing along with avoidance measures when indicated.   Ferol Luz, MD  [facility]

## 2023-01-30 NOTE — Patient Instructions (Addendum)
Shellfish Allergy Recurrent episodes of gastrointestinal symptoms (nausea, vomiting, diarrhea) and respiratory symptoms (cough, shortness of breath) following consumption of shrimp. Symptoms typically begin 5-6 hours post-ingestion and last for several hours. Differential includes IgE-mediated shellfish allergy and Food Protein-Induced Enterocolitis Syndrome (FPIES). Allergy testing (01/30/23): positive to lobster and scallops Blood work: negative to shellfish  Based on testing recommend strict avoidance of shellfish and mollusks  Epipen prescribed and written action plan provided    Penicillin Allergy History of rash following penicillin administration in childhood, but tolerated penicillin at age 38. Given the long interval since the last reaction, the likelihood of current penicillin allergy is low. -Plan for penicillin challenge within the next year   Seasonal Allergies History of seasonal allergies in [place], primarily due to ragweed and cedar. Currently asymptomatic in [place]. -Allergy test (01/30/23): positive to tree and mold  - Start avoidance measures   Follow up:  in 12 months   Thank you so much for letting me partake in your care today.  Don't hesitate to reach out if you have any additional concerns!  Ferol Luz, MD  [facility] [place] [place]  Reducing Pollen Exposure  The American Academy of Allergy, Asthma and Immunology suggests the following steps to reduce your exposure to pollen during allergy seasons.    Do not hang sheets or clothing out to dry; pollen may collect on these items. Do not mow lawns or spend time around freshly cut grass; mowing stirs up pollen. Keep windows closed at night.  Keep car windows closed while driving. Minimize morning activities outdoors, a time when pollen counts are usually at their highest. Stay indoors as much as possible when pollen counts or humidity is high and on windy days when pollen tends to  remain in the air longer. Use air conditioning when possible.  Many air conditioners have filters that trap the pollen spores. Use a HEPA room air filter to remove pollen form the indoor air you breathe.  Control of Mold Allergen   Mold and fungi can grow on a variety of surfaces provided certain temperature and moisture conditions exist.  Outdoor molds grow on plants, decaying vegetation and soil.  The major outdoor mold, Alternaria and Cladosporium, are found in very high numbers during hot and dry conditions.  Generally, a late Summer - Fall peak is seen for common outdoor fungal spores.  Rain will temporarily lower outdoor mold spore count, but counts rise rapidly when the rainy period ends.  The most important indoor molds are Aspergillus and Penicillium.  Dark, humid and poorly ventilated basements are ideal sites for mold growth.  The next most common sites of mold growth are the bathroom and the kitchen.  Outdoor (Seasonal) Mold Control  Use air conditioning and keep windows closed Avoid exposure to decaying vegetation. Avoid leaf raking. Avoid grain handling. Consider wearing a face mask if working in moldy areas.    Indoor (Perennial) Mold Control   Maintain humidity below 50%. Clean washable surfaces with 5% bleach solution. Remove sources e.g. contaminated carpets.

## 2023-02-08 ENCOUNTER — Telehealth: Payer: Self-pay | Admitting: Diagnostic Neuroimaging

## 2023-02-08 NOTE — Telephone Encounter (Signed)
 LVM and sent mychart msg informing pt of need to reschedule 05/21/23 appt - MD out

## 2023-05-21 ENCOUNTER — Telehealth: Payer: 59 | Admitting: Diagnostic Neuroimaging

## 2023-06-04 ENCOUNTER — Telehealth: Payer: Self-pay | Admitting: Diagnostic Neuroimaging

## 2023-06-04 NOTE — Telephone Encounter (Signed)
 Received [facility] records from [facility]. Bringing to POD 3 to review

## 2023-07-26 ENCOUNTER — Ambulatory Visit (HOSPITAL_BASED_OUTPATIENT_CLINIC_OR_DEPARTMENT_OTHER)
Admission: RE | Admit: 2023-07-26 | Discharge: 2023-07-26 | Disposition: A | Discharge status: Home / Self Care | Source: Ambulatory Visit | Attending: Internal Medicine | Admitting: Internal Medicine

## 2023-07-26 ENCOUNTER — Other Ambulatory Visit: Payer: Self-pay

## 2023-07-26 ENCOUNTER — Ambulatory Visit (INDEPENDENT_AMBULATORY_CARE_PROVIDER_SITE_OTHER): Admitting: Internal Medicine

## 2023-07-26 VITALS — BP 118/72 | HR 88 | Temp 98.1°F | Resp 20 | Wt 314.4 lb

## 2023-07-26 DIAGNOSIS — R112 Nausea with vomiting, unspecified: Secondary | ICD-10-CM | POA: Diagnosis not present

## 2023-07-26 DIAGNOSIS — R1084 Generalized abdominal pain: Secondary | ICD-10-CM

## 2023-07-26 DIAGNOSIS — T7802XD Anaphylactic reaction due to shellfish (crustaceans), subsequent encounter: Secondary | ICD-10-CM | POA: Diagnosis not present

## 2023-07-26 NOTE — Progress Notes (Signed)
 FOLLOW UP Date of Service/Encounter:  07/26/23  Subjective:  Carrie Buck (DOB: 1980-02-15) is a 43 y.o. female who [facility] in re-evaluation of the following: food reaction  History obtained from: chart review and patient.  For Review, LV was on 01/30/23  with Dr. Jolayne Natter seen for skin testing . See below for summary of history and diagnostics.   ----------------------------------------------------- Pertinent History/Diagnostics:  Allergic Rhinitis:  History of seasonal allergies in [place] , primarily due to ragweed and cedar. Currently asymptomatic in [place] . - SPT environmental panel (01/30/23): positive to tree and mold  FPIES vs FOOD Allergy :  Recurrent episodes of gastrointestinal symptoms (nausea, vomiting, diarrhea) and respiratory symptoms (cough, shortness of breath) following consumption of shrimp. Symptoms typically begin 5-6 hours post-ingestion and last for several hours. Differential includes IgE-mediated shellfish allergy  and Food Protein-Induced Enterocolitis Syndrome (FPIES). - SPT select foods (01/30/23): lobster and scallops  - sIgE: negative to shellfish  H/o Penicillin Allergy : History of rash following penicillin administration in childhood, but tolerated penicillin at age 57. Given the long interval since the last reaction, the likelihood of current penicillin allergy  is low.  --------------------------------------------------- Today presents for follow-up. Discussed the use of AI scribe software for clinical note transcription with the patient, who gave verbal consent to proceed.  History of Present Illness Carrie Buck is a 43 year old female with a shellfish allergy  who presents with gastrointestinal symptoms following a suspected allergic reaction.  Symptoms began early Sunday morning around 5:30 AM with severe vomiting and diarrhea, initially occurring every 10 to 15 minutes, and then every  minute or two. This acute phase lasted until about 1:00 PM. Since then, she has experienced extreme exhaustion and persistent gastrointestinal discomfort.  She describes a burning sensation throughout her stomach and intestines, accompanied by bloating and abdominal tightness, which is exacerbated by pressure on her abdomen. She has tried Gas-X, [employer], and Alka-Seltzer without relief. Lying on her side provides some pressure relief.  Her symptoms are similar to past reactions to shellfish, including burping with a distinct smell, but she has not consumed shellfish since last September or October. She suspects possible cross-contamination when dining out, despite informing restaurants of her allergy .  On Saturday night, she ate a hamburger from [employer], a meal she has had multiple times without issue. She questions whether she might have developed a new food allergy , as her symptoms began nearly 20 hours after eating, which is atypical for her previous reactions.  No recent tick bites. She has considered other potential allergens, such as strawberries and apples, which she has not consumed recently. She also mentions pressure washing her patio but doubts it caused her symptoms.  Ongoing watery stools have persisted since Sunday, along with frequent burping, though without the usual smell associated with her GI reactions. She has been unable to eat much, with her last substantial intake being some water  and crackers on Tuesday afternoon.  She has been managing her hydration with Pedialyte and electrolyte powders but struggles with feeling full quickly and has a diminished appetite. Concerned about dehydration but hesitant to seek ER care for IV fluids.     All medications reviewed by clinical staff and updated in chart. No new pertinent medical or surgical history except as noted in HPI.  ROS: All others negative except as noted per HPI.   Objective:  BP 118/72   Pulse 88   Temp  98.1 F (36.7 C) (Temporal)  Resp 20   Wt (!) 314 lb 6.4 oz (142.6 kg)   SpO2 97%   BMI 51.52 kg/m  Body mass index is 51.52 kg/m. Physical Exam: General Appearance:  Alert, cooperative, no distress, appears stated age  Head:  Normocephalic, without obvious abnormality, atraumatic  Eyes:  Conjunctiva clear, EOM's intact  Ears EACs normal bilaterally  Nose: Nares normal, no rhinorrhea   Throat: Lips, tongue normal; teeth and gums normal,    Neck: Supple, symmetrical  Lungs:   clear to auscultation bilaterally, Respirations unlabored, no coughing  Heart:  regular rate and rhythm and no murmur, Appears well perfused  Extremities: No edema  Skin: Skin color, texture, turgor normal and no rashes or lesions on visualized portions of skin  ABD:: Bowel sounds throughout, diffuse tenderness with palpitations, no rebound or guarding    Labs:  Lab Orders         Alpha-Gal Panel       Assessment/Plan   Patient Instructions  Nausea  and Diarrhea  - Will get labs for alpha gal  - Avoid red meat, gelatin and dairy for now  - Abdominal xray  - Liquid/pureed diet to alleviate gastroparesis-like symptoms   Shellfish Allergy  Allergy  testing (01/30/23): positive to lobster and scallops Blood work: negative to shellfish  Based on testing recommend strict avoidance of shellfish and mollusks  Epipen  prescribed and written action plan provided    Penicillin Allergy  -Plan for penicillin challenge within the next year   Seasonal Allergies -Allergy  test (01/30/23): positive to tree and mold  -Continue avoidance measures   Follow up:  in 12 months   Thank you so much for letting me partake in your care today.  Don't hesitate to reach out if you have any additional concerns!  Orelia Binet, MD  [facility] [place] [place]    Other:    Thank you so much for letting me partake in your care today.  Don't hesitate to reach out if you have any additional  concerns!  Orelia Binet, MD  [facility] [place] [place]

## 2023-07-26 NOTE — Patient Instructions (Addendum)
 Nausea  and Diarrhea  - Will get labs for alpha gal  - Avoid red meat, gelatin and dairy for now  - Abdominal xray  - Liquid/pureed diet to alleviate gastroparesis-like symptoms   Shellfish Allergy  Allergy  testing (01/30/23): positive to lobster and scallops Blood work: negative to shellfish  Based on testing recommend strict avoidance of shellfish and mollusks  Epipen  prescribed and written action plan provided    Penicillin Allergy  -Plan for penicillin challenge within the next year   Seasonal Allergies -Allergy  test (01/30/23): positive to tree and mold  -Continue avoidance measures   Follow up:  in 12 months   Thank you so much for letting me partake in your care today.  Don't hesitate to reach out if you have any additional concerns!  Orelia Binet, MD  [facility] [place] [place]

## 2023-07-30 ENCOUNTER — Telehealth: Payer: Self-pay

## 2023-07-30 LAB — ALPHA-GAL PANEL
Allergen Lamb IgE: <0.1 kU/L
Beef IgE: <0.1 kU/L
IgE (Immunoglobulin E), Serum: 9 [IU]/mL (ref 6–495)
O215-IgE Alpha-Gal: <0.1 kU/L
Pork IgE: <0.1 kU/L

## 2023-07-30 NOTE — Telephone Encounter (Signed)
 Spoke to Alba at reading room chest xray has not been read yet she will flag it as a STAT and will get results to you (Dr Lorin) as soon as possible.

## 2023-07-31 ENCOUNTER — Ambulatory Visit: Payer: Self-pay | Admitting: Internal Medicine

## 2023-07-31 NOTE — Progress Notes (Signed)
 Alpha gal panel is negative.  Okay to resume red meat as GI symptoms tolerate

## 2023-07-31 NOTE — Progress Notes (Signed)
 Abdominal xray showed gas, but no other findings.  I would continue gentle advancement of diet from liquid to puree as tolerated.

## 2023-08-03 NOTE — Telephone Encounter (Signed)
 IgE is the allergy  antibody.  It is not concerning if on the low end, we would be more concerned if significantly elevated.

## 2023-08-16 ENCOUNTER — Emergency Department (HOSPITAL_COMMUNITY)
Admission: EM | Admit: 2023-08-16 | Discharge: 2023-08-16 | Disposition: A | Discharge status: Home / Self Care | Attending: Emergency Medicine | Admitting: Emergency Medicine

## 2023-08-16 ENCOUNTER — Encounter (HOSPITAL_COMMUNITY): Payer: Self-pay

## 2023-08-16 ENCOUNTER — Other Ambulatory Visit: Payer: Self-pay

## 2023-08-16 DIAGNOSIS — Z7984 Long term (current) use of oral hypoglycemic drugs: Secondary | ICD-10-CM | POA: Diagnosis not present

## 2023-08-16 DIAGNOSIS — R000 Tachycardia, unspecified: Secondary | ICD-10-CM | POA: Diagnosis not present

## 2023-08-16 DIAGNOSIS — R7981 Abnormal blood-gas level: Secondary | ICD-10-CM | POA: Insufficient documentation

## 2023-08-16 DIAGNOSIS — R109 Unspecified abdominal pain: Secondary | ICD-10-CM | POA: Insufficient documentation

## 2023-08-16 DIAGNOSIS — R112 Nausea with vomiting, unspecified: Secondary | ICD-10-CM | POA: Diagnosis present

## 2023-08-16 DIAGNOSIS — R197 Diarrhea, unspecified: Secondary | ICD-10-CM | POA: Insufficient documentation

## 2023-08-16 LAB — GASTROINTESTINAL PANEL BY PCR, STOOL (REPLACES STOOL CULTURE)

## 2023-08-16 LAB — URINALYSIS, ROUTINE W REFLEX MICROSCOPIC
Bilirubin Urine: NEGATIVE
Glucose, UA: NEGATIVE mg/dL
Ketones, ur: NEGATIVE mg/dL
Leukocytes,Ua: NEGATIVE
Nitrite: NEGATIVE
Protein, ur: NEGATIVE mg/dL
Specific Gravity, Urine: 1.017 (ref 1.005–1.030)
pH: 5 (ref 5.0–8.0)

## 2023-08-16 LAB — COMPREHENSIVE METABOLIC PANEL WITH GFR
ALT: 33 U/L (ref 0–44)
AST: 19 U/L (ref 15–41)
Albumin: 4 g/dL (ref 3.5–5.0)
Alkaline Phosphatase: 69 U/L (ref 38–126)
Anion gap: 12 (ref 5–15)
BUN: 11 mg/dL (ref 6–20)
CO2: 18 mmol/L — ABNORMAL LOW (ref 22–32)
Calcium: 8.7 mg/dL — ABNORMAL LOW (ref 8.9–10.3)
Chloride: 105 mmol/L (ref 98–111)
Creatinine, Ser: 0.76 mg/dL (ref 0.44–1.00)
GFR, Estimated: >60 mL/min (ref >60)
Glucose, Bld: 134 mg/dL — ABNORMAL HIGH (ref 70–99)
Potassium: 3.4 mmol/L — ABNORMAL LOW (ref 3.5–5.1)
Sodium: 135 mmol/L (ref 135–145)
Total Bilirubin: 1 mg/dL (ref 0.0–1.2)
Total Protein: 7.9 g/dL (ref 6.5–8.1)

## 2023-08-16 LAB — LIPASE, BLOOD: Lipase: 28 U/L (ref 11–51)

## 2023-08-16 LAB — CBC WITH DIFFERENTIAL/PLATELET
Abs Immature Granulocytes: 0.01 K/uL (ref 0.00–0.07)
Basophils Absolute: 0 K/uL (ref 0.0–0.1)
Basophils Relative: 0 %
Eosinophils Absolute: 0.1 K/uL (ref 0.0–0.5)
Eosinophils Relative: 1 %
HCT: 42 % (ref 36.0–46.0)
Hemoglobin: 13 g/dL (ref 12.0–15.0)
Immature Granulocytes: 0 %
Lymphocytes Relative: 22 %
Lymphs Abs: 1.8 K/uL (ref 0.7–4.0)
MCH: 26.8 pg (ref 26.0–34.0)
MCHC: 31 g/dL (ref 30.0–36.0)
MCV: 86.6 fL (ref 80.0–100.0)
Monocytes Absolute: 0.6 K/uL (ref 0.1–1.0)
Monocytes Relative: 8 %
Neutro Abs: 5.5 K/uL (ref 1.7–7.7)
Neutrophils Relative %: 69 %
Platelets: 306 K/uL (ref 150–400)
RBC: 4.85 MIL/uL (ref 3.87–5.11)
RDW: 15.7 % — ABNORMAL HIGH (ref 11.5–15.5)
WBC Morphology: INCREASED
WBC: 8.1 K/uL (ref 4.0–10.5)
nRBC: 0.2 % (ref 0.0–0.2)

## 2023-08-16 LAB — PREGNANCY, URINE: Preg Test, Ur: NEGATIVE

## 2023-08-16 MED ORDER — METHYLPREDNISOLONE SODIUM SUCC 125 MG IJ SOLR
125.0000 mg | Freq: Once | INTRAMUSCULAR | Status: AC
  Administered 2023-08-16: 125 mg via INTRAVENOUS
  Filled 2023-08-16: qty 2

## 2023-08-16 MED ORDER — SODIUM CHLORIDE 0.9 % IV BOLUS
1000.0000 mL | Freq: Once | INTRAVENOUS | Status: AC
  Administered 2023-08-16: 1000 mL via INTRAVENOUS

## 2023-08-16 MED ORDER — PANTOPRAZOLE SODIUM 40 MG IV SOLR
40.0000 mg | Freq: Once | INTRAVENOUS | Status: AC
  Administered 2023-08-16: 40 mg via INTRAVENOUS
  Filled 2023-08-16: qty 10

## 2023-08-16 NOTE — ED Notes (Signed)
 Pt was given cup of water  for PO challenge. Water  was well tolerated with no episode of emesis.

## 2023-08-16 NOTE — ED Triage Notes (Signed)
 Patient presented to ER for shellfish allergic reaction. Patient states she has a GI reaction in the past including GI paralysis and dehydration. Instructed by GI doctor to come to ER.

## 2023-08-16 NOTE — ED Provider Notes (Signed)
 [place] EMERGENCY DEPARTMENT AT [facility] Provider Note   CSN: [id number] Arrival date & time: 08/16/23  1046     Patient presents with: GI Problem   Carrie Buck is a 43 y.o. female.  She is here with a complaint of nausea vomiting diarrhea that started 2 days ago.  Continues with symptoms along with burning abdominal pain.  She feels it is related to a possible allergic reaction.  She was diagnosed with a shellfish allergy  last year and went out to eat 2 nights ago, question cross-contamination with some shellfish.  No blood in the vomit or diarrhea.  Feels cold but she does not think she has had a fever.  No shortness of breath or rash.  She talked to her allergist today who recommended she come here for hydration and further evaluation.   The history is provided by the patient.  Emesis Severity:  Moderate Duration:  2 days Timing:  Intermittent Progression:  Unchanged Chronicity:  Recurrent Associated symptoms: abdominal pain, chills and diarrhea   Associated symptoms: no fever   Risk factors: suspect food intake        Prior to Admission medications   Medication Sig Start Date End Date Taking? Authorizing Provider  Ascorbic Acid (VITAMIN C) 500 MG CAPS Take 500 mg by mouth 3 (three) times a week.    [provider]  Coenzyme Q10 (CO Q-10 PO) Take 1 capsule by mouth daily. Patient not taking: Reported on 01/10/2023    [provider]  Cyanocobalamin (B-12 PO) Take 1 tablet by mouth daily.    [provider]  diphenhydrAMINE  (BENADRYL ) 25 MG tablet Take 25 mg by mouth as needed for allergies.    [provider]  EPINEPHrine  (AUVI-Q ) 0.3 mg/0.3 mL IJ SOAJ injection Inject 0.3 mg into the muscle as needed for anaphylaxis. 01/30/23   Lorin Norris, MD  FLUoxetine  (PROZAC ) 20 MG capsule Take 20 mg by mouth daily.    [provider]  JUNEL FE 1.5/30 1.5-30 MG-MCG tablet Take 1 tablet by mouth daily. 12/29/20    [provider]  metFORMIN  (GLUCOPHAGE ) 500 MG tablet Take 500 mg by mouth daily. 12/15/22   [provider]  Multiple Vitamin (MULTIVITAMIN WITH MINERALS) TABS tablet Take 1 tablet by mouth daily.    [provider]  Nutritional Supplements (VITAMIN D  BOOSTER PO) Take 50,000 Units by mouth every 30 (thirty) days. REPLASTA    [provider]  omeprazole (PRILOSEC) 20 MG capsule Take 20 mg by mouth daily as needed (acid reflux). 03/10/21   [provider]  ondansetron  (ZOFRAN ) 4 MG tablet Take 1 tablet (4 mg total) by mouth every 6 (six) hours. Patient not taking: Reported on 01/10/2023 12/11/22   Laurice Maude BROCKS, MD  oxyCODONE  (OXY IR/ROXICODONE ) 5 MG immediate release tablet Take 1 tablet (5 mg total) by mouth every 6 (six) hours as needed for severe pain. Patient not taking: Reported on 01/10/2023 11/24/21   Tanda Locus, MD  phentermine (ADIPEX-P) 37.5 MG tablet Take 18.75-37.5 mg by mouth every morning. 12/10/22   [provider]  Semaglutide -Weight Management (WEGOVY ) 0.25 MG/0.5ML SOAJ Inject 0.25 mg into the skin every Thursday.    [provider]  topiramate (TOPAMAX) 25 MG tablet Take 25 mg by mouth daily. 12/17/22   [provider]  zinc gluconate 50 MG tablet Take 50 mg by mouth daily. Patient not taking: Reported on 01/10/2023    [provider]    Allergies: Shrimp  flavor [flavoring agent (non-screening)] and Penicillins    Review of Systems  Constitutional:  Positive for chills. Negative for fever.  Eyes:  Negative for visual disturbance.  Respiratory:  Negative for shortness of breath.   Cardiovascular:  Negative for chest pain.  Gastrointestinal:  Positive for abdominal pain, diarrhea, nausea and vomiting.    Updated Vital Signs BP (!) 154/114 (BP Location: Left Arm)   Pulse (!) 116   Temp 98.3 F (36.8 C) (Oral)   Resp 15   Ht 5' 5 (1.651 m)   Wt (!) 143 kg   SpO2 100%   BMI 52.46 kg/m    Physical Exam Vitals and nursing note reviewed.  Constitutional:      General: She is not in acute distress.    Appearance: Normal appearance. She is well-developed.  HENT:     Head: Normocephalic and atraumatic.  Eyes:     Conjunctiva/sclera: Conjunctivae normal.  Cardiovascular:     Rate and Rhythm: Regular rhythm. Tachycardia present.     Heart sounds: No murmur heard. Pulmonary:     Effort: Pulmonary effort is normal. No respiratory distress.     Breath sounds: Normal breath sounds. No stridor. No wheezing.  Abdominal:     Palpations: Abdomen is soft.     Tenderness: There is no abdominal tenderness. There is no guarding or rebound.  Musculoskeletal:        General: No deformity.     Cervical back: Neck supple.  Skin:    General: Skin is warm and dry.  Neurological:     General: No focal deficit present.     Mental Status: She is alert.     GCS: GCS eye subscore is 4. GCS verbal subscore is 5. GCS motor subscore is 6.     Gait: Gait normal.     (all labs ordered are listed, but only abnormal results are displayed) Labs Reviewed  COMPREHENSIVE METABOLIC PANEL WITH GFR - Abnormal; Notable for the following components:      Result Value   Potassium 3.4 (*)    CO2 18 (*)    Glucose, Bld 134 (*)    Calcium 8.7 (*)    All other components within normal limits  CBC WITH DIFFERENTIAL/PLATELET - Abnormal; Notable for the following components:   RDW 15.7 (*)    All other components within normal limits  URINALYSIS, ROUTINE W REFLEX MICROSCOPIC - Abnormal; Notable for the following components:   APPearance HAZY (*)    Hgb urine dipstick MODERATE (*)    Bacteria, UA MANY (*)    All other components within normal limits  GASTROINTESTINAL PANEL BY PCR, STOOL (REPLACES STOOL CULTURE)  LIPASE, BLOOD  PREGNANCY, URINE    EKG: None  Radiology: No results found.   Procedures   Medications Ordered in the ED  sodium chloride  0.9 % bolus 1,000 mL (has no  administration in time range)  pantoprazole  (PROTONIX ) injection 40 mg (has no administration in time range)  methylPREDNISolone  sodium succinate (SOLU-MEDROL ) 125 mg/2 mL injection 125 mg (has no administration in time range)    Clinical Course as of 08/16/23 1807  Thu Aug 16, 2023  1340 Patient's lab work is fairly unremarkable other than a low bicarb.  She is tolerating p.o. and she is feeling improved.  She does not want any more fluids and would like to go home and rest.  She is already on a PPI.  Return instructions discussed [MB]    Clinical Course User Index [MB]  Towana Ozell BROCKS, MD                                 Medical Decision Making Amount and/or Complexity of Data Reviewed Labs: ordered.  Risk Prescription drug management.   This patient complains of nausea vomiting diarrhea burning abdominal pain; this involves an extensive number of treatment Options and is a complaint that carries with it a high risk of complications and morbidity. The differential includes gastroenteritis, allergic reaction, biliary colic, gastritis, peptic ulcer disease  I ordered, reviewed and interpreted labs, which included CBC normal, chemistries with low bicarb normal LFTs, urinalysis with possible signs of infection but no symptoms, pregnancy negative, stool panel sent I ordered medication IV fluids PPI and steroids and reviewed PMP when indicated. Previous records obtained and reviewed in epic including recent PCP notes Cardiac monitoring reviewed, sinus rhythm Social determinants considered, no significant barriers Critical Interventions: None  After the interventions stated above, I reevaluated the patient and found patient to be symptomatically feeling better and tolerating p.o. Admission and further testing considered, she does not wish any for further fluid and would like to go home.  She feels she can adequately rehydrate from here.  Return instructions discussed.      Final  diagnoses:  Nausea vomiting and diarrhea    ED Discharge Orders     None          Towana Ozell BROCKS, MD 08/16/23 [id number]

## 2023-08-16 NOTE — Discharge Instructions (Signed)
 You were seen in the emergency department for nausea vomiting diarrhea possible allergic reaction.  You were given a dose of steroids and some IV fluids with improvement in your symptoms.  Start with a clear liquid diet advance as tolerated.  Follow-up with your regular doctor and return to the emergency department if any worsening or concerning symptoms

## 2023-09-17 ENCOUNTER — Emergency Department (HOSPITAL_COMMUNITY)
Admission: EM | Admit: 2023-09-17 | Discharge: 2023-09-17 | Discharge status: Against Medical Advice | Attending: Emergency Medicine | Admitting: Emergency Medicine

## 2023-09-17 ENCOUNTER — Encounter (HOSPITAL_COMMUNITY): Payer: Self-pay

## 2023-09-17 ENCOUNTER — Other Ambulatory Visit: Payer: Self-pay

## 2023-09-17 DIAGNOSIS — R197 Diarrhea, unspecified: Secondary | ICD-10-CM | POA: Diagnosis not present

## 2023-09-17 DIAGNOSIS — Z5321 Procedure and treatment not carried out due to patient leaving prior to being seen by health care provider: Secondary | ICD-10-CM | POA: Insufficient documentation

## 2023-09-17 DIAGNOSIS — R112 Nausea with vomiting, unspecified: Secondary | ICD-10-CM | POA: Insufficient documentation

## 2023-09-17 DIAGNOSIS — R109 Unspecified abdominal pain: Secondary | ICD-10-CM | POA: Diagnosis not present

## 2023-09-17 DIAGNOSIS — R61 Generalized hyperhidrosis: Secondary | ICD-10-CM | POA: Insufficient documentation

## 2023-09-17 LAB — URINALYSIS, ROUTINE W REFLEX MICROSCOPIC
Bilirubin Urine: NEGATIVE
Glucose, UA: NEGATIVE mg/dL
Ketones, ur: 5 mg/dL — AB
Leukocytes,Ua: NEGATIVE
Nitrite: NEGATIVE
Protein, ur: 100 mg/dL — AB
Specific Gravity, Urine: 1.025 (ref 1.005–1.030)
pH: 5 (ref 5.0–8.0)

## 2023-09-17 LAB — CBC WITH DIFFERENTIAL/PLATELET
Abs Immature Granulocytes: 0.07 K/uL (ref 0.00–0.07)
Basophils Absolute: 0 K/uL (ref 0.0–0.1)
Basophils Relative: 0 %
Eosinophils Absolute: 0 K/uL (ref 0.0–0.5)
Eosinophils Relative: 0 %
HCT: 44.9 % (ref 36.0–46.0)
Hemoglobin: 14.4 g/dL (ref 12.0–15.0)
Immature Granulocytes: 1 %
Lymphocytes Relative: 27 %
Lymphs Abs: 4.2 K/uL — ABNORMAL HIGH (ref 0.7–4.0)
MCH: 27.8 pg (ref 26.0–34.0)
MCHC: 32.1 g/dL (ref 30.0–36.0)
MCV: 86.7 fL (ref 80.0–100.0)
Monocytes Absolute: 0.7 K/uL (ref 0.1–1.0)
Monocytes Relative: 4 %
Neutro Abs: 10.6 K/uL — ABNORMAL HIGH (ref 1.7–7.7)
Neutrophils Relative %: 68 %
Platelets: 377 K/uL (ref 150–400)
RBC: 5.18 MIL/uL — ABNORMAL HIGH (ref 3.87–5.11)
RDW: 15.6 % — ABNORMAL HIGH (ref 11.5–15.5)
WBC: 15.5 K/uL — ABNORMAL HIGH (ref 4.0–10.5)
nRBC: 0 % (ref 0.0–0.2)

## 2023-09-17 LAB — COMPREHENSIVE METABOLIC PANEL WITH GFR
ALT: 32 U/L (ref 0–44)
AST: 21 U/L (ref 15–41)
Albumin: 4.5 g/dL (ref 3.5–5.0)
Alkaline Phosphatase: 97 U/L (ref 38–126)
Anion gap: 15 (ref 5–15)
BUN: 19 mg/dL (ref 6–20)
CO2: 17 mmol/L — ABNORMAL LOW (ref 22–32)
Calcium: 10 mg/dL (ref 8.9–10.3)
Chloride: 108 mmol/L (ref 98–111)
Creatinine, Ser: 0.79 mg/dL (ref 0.44–1.00)
GFR, Estimated: >60 mL/min (ref >60)
Glucose, Bld: 175 mg/dL — ABNORMAL HIGH (ref 70–99)
Potassium: 3.4 mmol/L — ABNORMAL LOW (ref 3.5–5.1)
Sodium: 140 mmol/L (ref 135–145)
Total Bilirubin: 0.7 mg/dL (ref 0.0–1.2)
Total Protein: 8.8 g/dL — ABNORMAL HIGH (ref 6.5–8.1)

## 2023-09-17 LAB — LIPASE, BLOOD: Lipase: 39 U/L (ref 11–51)

## 2023-09-17 LAB — HCG, SERUM, QUALITATIVE: Preg, Serum: NEGATIVE

## 2023-09-17 NOTE — ED Provider Triage Note (Signed)
 Emergency Medicine Provider Triage Evaluation Note  Carrie Buck , a 43 y.o. female  was evaluated in triage.  Pt complains of nausea, vomiting, diarrhea x 30 minutes. Sudden onset. Diaphoretic at home. Vomited 5 minutes ago.   Review of Systems  Positive: Nausea, vomiting, diarrhea Negative: Chest pain, shortness of breath, abdominal pain   Physical Exam  BP (!) 153/118   Pulse (!) 121   Temp 98.4 F (36.9 C)   Resp 19   Ht 5' 5 (1.651 m)   Wt (!) 143 kg   SpO2 100%   BMI 52.46 kg/m  Gen:   Awake, no distress   Resp:  Normal effort  MSK:   Moves extremities without difficulty  Other:  Actively vomiting, diaphoretic appearing   Medical Decision Making  Medically screening exam initiated at 6:15 PM.  Appropriate orders placed.  Carrie Buck was informed that the remainder of the evaluation will be completed by another provider, this initial triage assessment does not replace that evaluation, and the importance of remaining in the ED until their evaluation is complete.  Orders: CBC, CMP, UA, EKG, serum preg, lipase   Carrie Buck, [place] 09/17/23 1824

## 2023-09-17 NOTE — ED Triage Notes (Signed)
 Pt reports sudden N/V/D x30 minutes PTA. Pt extremely diaphoretic on arrival.

## 2023-10-09 ENCOUNTER — Other Ambulatory Visit: Payer: Self-pay | Admitting: Family Medicine

## 2023-10-09 DIAGNOSIS — Z1231 Encounter for screening mammogram for malignant neoplasm of breast: Secondary | ICD-10-CM

## 2023-11-21 ENCOUNTER — Ambulatory Visit
Admission: RE | Admit: 2023-11-21 | Discharge: 2023-11-21 | Disposition: A | Source: Ambulatory Visit | Attending: Family Medicine

## 2023-11-21 DIAGNOSIS — Z1231 Encounter for screening mammogram for malignant neoplasm of breast: Secondary | ICD-10-CM

## 2023-12-09 ENCOUNTER — Encounter: Payer: Self-pay | Admitting: Internal Medicine

## 2023-12-26 ENCOUNTER — Other Ambulatory Visit: Payer: Self-pay | Admitting: Nurse Practitioner

## 2023-12-26 DIAGNOSIS — R2230 Localized swelling, mass and lump, unspecified upper limb: Secondary | ICD-10-CM

## 2024-01-10 ENCOUNTER — Ambulatory Visit
Admission: RE | Admit: 2024-01-10 | Discharge: 2024-01-10 | Disposition: A | Source: Ambulatory Visit | Attending: Nurse Practitioner | Admitting: Nurse Practitioner

## 2024-01-10 DIAGNOSIS — R2230 Localized swelling, mass and lump, unspecified upper limb: Secondary | ICD-10-CM
